# Patient Record
Sex: Male | Born: 1945 | Race: White | Hispanic: No | Marital: Married | Smoking: Current some day smoker
Health system: Southern US, Community
[De-identification: ages and names within clinical notes are randomized; demographics above are authoritative.]

## PROBLEM LIST (undated history)

## (undated) DIAGNOSIS — I5042 Chronic combined systolic (congestive) and diastolic (congestive) heart failure: Secondary | ICD-10-CM

## (undated) DIAGNOSIS — I10 Essential (primary) hypertension: Secondary | ICD-10-CM

## (undated) DIAGNOSIS — I255 Ischemic cardiomyopathy: Secondary | ICD-10-CM

## (undated) DIAGNOSIS — E7800 Pure hypercholesterolemia, unspecified: Secondary | ICD-10-CM

## (undated) DIAGNOSIS — F17200 Nicotine dependence, unspecified, uncomplicated: Secondary | ICD-10-CM

## (undated) DIAGNOSIS — I2510 Atherosclerotic heart disease of native coronary artery without angina pectoris: Secondary | ICD-10-CM

## (undated) DIAGNOSIS — N183 Chronic kidney disease, stage 3 (moderate): Secondary | ICD-10-CM

## (undated) HISTORY — PX: CORONARY STENT PLACEMENT: SHX1402

## (undated) HISTORY — PX: CORONARY ARTERY BYPASS GRAFT: SHX141

## (undated) HISTORY — PX: PACEMAKER INSERTION: SHX728

## (undated) HISTORY — PX: CARDIAC DEFIBRILLATOR PLACEMENT: SHX171

---

## 2007-11-10 ENCOUNTER — Emergency Department (HOSPITAL_COMMUNITY): Admission: EM | Admit: 2007-11-10 | Discharge: 2007-11-10 | Payer: Self-pay | Admitting: Emergency Medicine

## 2008-10-16 ENCOUNTER — Encounter: Payer: Self-pay | Admitting: Internal Medicine

## 2008-10-31 ENCOUNTER — Encounter: Payer: Self-pay | Admitting: Internal Medicine

## 2008-10-31 ENCOUNTER — Ambulatory Visit: Payer: Self-pay | Admitting: Internal Medicine

## 2008-10-31 ENCOUNTER — Ambulatory Visit (HOSPITAL_COMMUNITY): Admission: RE | Admit: 2008-10-31 | Discharge: 2008-10-31 | Payer: Self-pay | Admitting: Internal Medicine

## 2008-11-14 ENCOUNTER — Encounter: Payer: Self-pay | Admitting: Internal Medicine

## 2010-08-31 NOTE — Op Note (Signed)
NAME:  Stephen Macdonald, Stephen Macdonald              ACCOUNT NO.:  [id number]   MEDICAL RECORD NO.:  [id number]          PATIENT TYPE:  AMB   LOCATION:  DAY                           FACILITY:  [facility]   PHYSICIAN:  R. Roetta Sessions, M.D. DATE OF BIRTH:  February 14, 1946   DATE OF PROCEDURE:  10/31/2008  DATE OF DISCHARGE:                               OPERATIVE REPORT   INDICATIONS FOR PROCEDURE:  A 65 year old gentleman sent over at the  request of Dr. Janna Arch for colorectal cancer screening.  He has never  had his lower GI tract evaluated.  He has no lower GI tract symptoms.  No family history of colon polyps or colon cancer.  Colonoscopy is now  being done as a screen maneuver.  The risks, benefits, alternatives and  limitation have been discussed and questions answered, and he is  agreeable.  Please see the documentation in the medical record.   PROCEDURE NOTE:  O2 saturation, blood pressure, pulse, and respirations  were monitored throughout the entirety of the procedure. Conscious  sedation with Versed 4 mg IV, Demerol 75 mg IV in divided doses.   INSTRUMENTATION:  Pentax video chip system.   FINDINGS:  Digital rectal exam revealed no abnormalities.  The prep was  adequate.  The colonic mucosa was surveyed from the rectosigmoid  junction through the left transverse right colon, the appendiceal  orifice, ileocecal valve and cecum.  These structures were well seen and  photographed for the record.  From this level the scope was slowly  withdrawn.  All previous mucosal surfaces were again seen.  The patient  was noted to have a single diminutive polyp in the cecum, which was cold  biopsied and removed.  The remainder of the colonic mucosa appeared  normal aside from scattered left-sided diverticula.  The scope was  pulled down into the rectum, with thorough examination of the rectal  mucosa, including retroflexed view of the anal verge demonstrated no  abnormalities. The patient tolerated the procedure  well and was reactive  throughout the endoscopy.  Cecal withdrawal time was 7 minutes.   IMPRESSION:  1. Normal rectum.  2. Left-sided diverticula.  3. Cecal polyp, which was cold biopsied and removed.  4. The remainder of the colonic mucosa appeared normal.   RECOMMENDATIONS:  1. Diverticulosis and polyp literature provided to Mr. Napoli.  2. Follow up path.  3. Further recommendations to follow.      Jonathon Bellows, M.D.  Electronically Signed     RMR/MEDQ  D:  10/31/2008  T:  10/31/2008  Job:  [id number]   cc:   Melvyn Novas, MD  Fax: [phone]

## 2011-01-14 LAB — BASIC METABOLIC PANEL
CO2: 26
Chloride: 108
GFR calc non Af Amer: >60
Glucose, Bld: 112 — ABNORMAL HIGH
Potassium: 4.1
Sodium: 136

## 2011-01-14 LAB — POCT CARDIAC MARKERS
CKMB, poc: <1 — ABNORMAL LOW
Myoglobin, poc: 44.6
Troponin i, poc: <0.05

## 2011-01-14 LAB — B-NATRIURETIC PEPTIDE (CONVERTED LAB): Pro B Natriuretic peptide (BNP): <30

## 2011-01-14 LAB — CBC
HCT: 45.3
Hemoglobin: 15.6
RDW: 12.5

## 2011-01-14 LAB — DIFFERENTIAL
Basophils Absolute: 0
Eosinophils Relative: 4
Lymphocytes Relative: 28
Lymphs Abs: 2
Monocytes Absolute: 0.6

## 2015-02-12 ENCOUNTER — Encounter (HOSPITAL_COMMUNITY)
Admission: EM | Disposition: A | Discharge status: Home / Self Care | Payer: Self-pay | Source: Home / Self Care | Attending: Interventional Cardiology

## 2015-02-12 ENCOUNTER — Inpatient Hospital Stay (HOSPITAL_COMMUNITY)
Admission: EM | Admit: 2015-02-12 | Discharge: 2015-02-16 | DRG: 247 | Disposition: A | Discharge status: Home / Self Care | Payer: Medicare Other | Attending: Interventional Cardiology | Admitting: Interventional Cardiology

## 2015-02-12 ENCOUNTER — Encounter (HOSPITAL_COMMUNITY): Payer: Self-pay

## 2015-02-12 DIAGNOSIS — I2510 Atherosclerotic heart disease of native coronary artery without angina pectoris: Secondary | ICD-10-CM | POA: Diagnosis present

## 2015-02-12 DIAGNOSIS — Z95810 Presence of automatic (implantable) cardiac defibrillator: Secondary | ICD-10-CM

## 2015-02-12 DIAGNOSIS — I2111 ST elevation (STEMI) myocardial infarction involving right coronary artery: Secondary | ICD-10-CM | POA: Insufficient documentation

## 2015-02-12 DIAGNOSIS — R21 Rash and other nonspecific skin eruption: Secondary | ICD-10-CM | POA: Diagnosis not present

## 2015-02-12 DIAGNOSIS — N179 Acute kidney failure, unspecified: Secondary | ICD-10-CM | POA: Diagnosis not present

## 2015-02-12 DIAGNOSIS — I959 Hypotension, unspecified: Secondary | ICD-10-CM | POA: Diagnosis present

## 2015-02-12 DIAGNOSIS — I420 Dilated cardiomyopathy: Secondary | ICD-10-CM

## 2015-02-12 DIAGNOSIS — I2119 ST elevation (STEMI) myocardial infarction involving other coronary artery of inferior wall: Secondary | ICD-10-CM | POA: Diagnosis present

## 2015-02-12 DIAGNOSIS — I25810 Atherosclerosis of coronary artery bypass graft(s) without angina pectoris: Secondary | ICD-10-CM | POA: Diagnosis present

## 2015-02-12 DIAGNOSIS — E785 Hyperlipidemia, unspecified: Secondary | ICD-10-CM | POA: Insufficient documentation

## 2015-02-12 DIAGNOSIS — I472 Ventricular tachycardia: Secondary | ICD-10-CM | POA: Diagnosis present

## 2015-02-12 DIAGNOSIS — I255 Ischemic cardiomyopathy: Secondary | ICD-10-CM | POA: Diagnosis not present

## 2015-02-12 DIAGNOSIS — Z951 Presence of aortocoronary bypass graft: Secondary | ICD-10-CM | POA: Insufficient documentation

## 2015-02-12 DIAGNOSIS — I429 Cardiomyopathy, unspecified: Secondary | ICD-10-CM | POA: Diagnosis present

## 2015-02-12 DIAGNOSIS — R079 Chest pain, unspecified: Secondary | ICD-10-CM | POA: Diagnosis present

## 2015-02-12 DIAGNOSIS — I4729 Other ventricular tachycardia: Secondary | ICD-10-CM | POA: Insufficient documentation

## 2015-02-12 HISTORY — DX: Atherosclerotic heart disease of native coronary artery without angina pectoris: I25.10

## 2015-02-12 HISTORY — DX: Nicotine dependence, unspecified, uncomplicated: F17.200

## 2015-02-12 HISTORY — PX: CARDIAC CATHETERIZATION: SHX172

## 2015-02-12 LAB — POCT I-STAT, CHEM 8
BUN: 16 mg/dL (ref 6–20)
CALCIUM ION: 1.16 mmol/L (ref 1.13–1.30)
CHLORIDE: 109 mmol/L (ref 101–111)
Creatinine, Ser: 1.1 mg/dL (ref 0.61–1.24)
GLUCOSE: 118 mg/dL — AB (ref 65–99)
HCT: 40 % (ref 39.0–52.0)
Hemoglobin: 13.6 g/dL (ref 13.0–17.0)
POTASSIUM: 3.4 mmol/L — AB (ref 3.5–5.1)
Sodium: 140 mmol/L (ref 135–145)
TCO2: 18 mmol/L (ref 0–100)

## 2015-02-12 LAB — POCT ACTIVATED CLOTTING TIME: Activated Clotting Time: 522 seconds

## 2015-02-12 SURGERY — LEFT HEART CATH AND CORONARY ANGIOGRAPHY
Anesthesia: LOCAL

## 2015-02-12 MED ORDER — BIVALIRUDIN BOLUS VIA INFUSION - CUPID
INTRAVENOUS | Status: DC | PRN
  Administered 2015-02-12: 79.5 mg via INTRAVENOUS

## 2015-02-12 MED ORDER — ACETAMINOPHEN 325 MG PO TABS: 650.0000 mg | ORAL_TABLET | ORAL | Status: DC | PRN

## 2015-02-12 MED ORDER — TICAGRELOR 90 MG PO TABS
ORAL_TABLET | ORAL | Status: AC
  Filled 2015-02-12: qty 1

## 2015-02-12 MED ORDER — ADENOSINE (DIAGNOSTIC) FOR INTRACORONARY USE
INTRAVENOUS | Status: DC | PRN
  Administered 2015-02-12 (×2): 30 ug via INTRACORONARY

## 2015-02-12 MED ORDER — ASPIRIN 81 MG PO CHEW
81.0000 mg | CHEWABLE_TABLET | Freq: Every day | ORAL | Status: DC
  Administered 2015-02-12 – 2015-02-16 (×5): 81 mg via ORAL
  Filled 2015-02-12 (×5): qty 1

## 2015-02-12 MED ORDER — ONDANSETRON HCL 4 MG/2ML IJ SOLN
4.0000 mg | Freq: Four times a day (QID) | INTRAMUSCULAR | Status: DC | PRN
  Administered 2015-02-13: 4 mg via INTRAVENOUS
  Filled 2015-02-12 (×2): qty 2

## 2015-02-12 MED ORDER — FENTANYL CITRATE (PF) 100 MCG/2ML IJ SOLN
INTRAMUSCULAR | Status: DC | PRN
  Administered 2015-02-12 (×2): 25 ug via INTRAVENOUS

## 2015-02-12 MED ORDER — VERAPAMIL HCL 2.5 MG/ML IV SOLN
INTRAVENOUS | Status: AC
  Filled 2015-02-12: qty 2

## 2015-02-12 MED ORDER — SODIUM CHLORIDE 0.9 % IJ SOLN
3.0000 mL | Freq: Two times a day (BID) | INTRAMUSCULAR | Status: DC
  Administered 2015-02-13 – 2015-02-16 (×7): 3 mL via INTRAVENOUS

## 2015-02-12 MED ORDER — ADENOSINE 6 MG/2ML IV SOLN
INTRAVENOUS | Status: AC
  Filled 2015-02-12: qty 2

## 2015-02-12 MED ORDER — BIVALIRUDIN 250 MG IV SOLR
INTRAVENOUS | Status: AC
  Filled 2015-02-12: qty 250

## 2015-02-12 MED ORDER — SODIUM CHLORIDE 0.9 % IJ SOLN: 3.0000 mL | INTRAMUSCULAR | Status: DC | PRN

## 2015-02-12 MED ORDER — SODIUM CHLORIDE 0.9 % IV SOLN
250.0000 mg | INTRAVENOUS | Status: DC | PRN
  Administered 2015-02-12: 1.75 mg/kg/h via INTRAVENOUS

## 2015-02-12 MED ORDER — METOPROLOL TARTRATE 12.5 MG HALF TABLET
12.5000 mg | ORAL_TABLET | Freq: Two times a day (BID) | ORAL | Status: DC
  Administered 2015-02-12 – 2015-02-14 (×4): 12.5 mg via ORAL
  Filled 2015-02-12 (×6): qty 1

## 2015-02-12 MED ORDER — LIDOCAINE HCL (PF) 1 % IJ SOLN
INTRAMUSCULAR | Status: DC | PRN
  Administered 2015-02-12: 15 mL

## 2015-02-12 MED ORDER — ATORVASTATIN CALCIUM 80 MG PO TABS
80.0000 mg | ORAL_TABLET | Freq: Every day | ORAL | Status: DC
  Administered 2015-02-13 – 2015-02-15 (×3): 80 mg via ORAL
  Filled 2015-02-12 (×3): qty 1

## 2015-02-12 MED ORDER — NITROGLYCERIN IN D5W 200-5 MCG/ML-% IV SOLN
INTRAVENOUS | Status: AC
  Filled 2015-02-12: qty 250

## 2015-02-12 MED ORDER — MIDAZOLAM HCL 2 MG/2ML IJ SOLN
INTRAMUSCULAR | Status: DC | PRN
  Administered 2015-02-12 (×2): 1 mg via INTRAVENOUS

## 2015-02-12 MED ORDER — MIDAZOLAM HCL 2 MG/2ML IJ SOLN
INTRAMUSCULAR | Status: AC
  Filled 2015-02-12: qty 4

## 2015-02-12 MED ORDER — HEPARIN SODIUM (PORCINE) 5000 UNIT/ML IJ SOLN
4000.0000 [IU] | Freq: Once | INTRAMUSCULAR | Status: AC
  Administered 2015-02-12: 4000 [IU] via INTRAVENOUS

## 2015-02-12 MED ORDER — TICAGRELOR 90 MG PO TABS
ORAL_TABLET | ORAL | Status: DC | PRN
  Administered 2015-02-12: 180 mg via ORAL

## 2015-02-12 MED ORDER — FENTANYL CITRATE (PF) 100 MCG/2ML IJ SOLN
INTRAMUSCULAR | Status: AC
  Filled 2015-02-12: qty 4

## 2015-02-12 MED ORDER — IOHEXOL 350 MG/ML SOLN
INTRAVENOUS | Status: DC | PRN
  Administered 2015-02-12: 145 mL via INTRA_ARTERIAL

## 2015-02-12 MED ORDER — TICAGRELOR 90 MG PO TABS
90.0000 mg | ORAL_TABLET | Freq: Two times a day (BID) | ORAL | Status: DC
Start: 2015-02-13 — End: 2015-02-16
  Administered 2015-02-13 – 2015-02-16 (×7): 90 mg via ORAL
  Filled 2015-02-12 (×8): qty 1

## 2015-02-12 MED ORDER — SODIUM CHLORIDE 0.9 % IV SOLN
INTRAVENOUS | Status: AC
  Administered 2015-02-12: 22:00:00 via INTRAVENOUS

## 2015-02-12 MED ORDER — SODIUM CHLORIDE 0.9 % IV SOLN: 250.0000 mL | INTRAVENOUS | Status: DC | PRN

## 2015-02-12 MED ORDER — NITROGLYCERIN 1 MG/10 ML FOR IR/CATH LAB
INTRA_ARTERIAL | Status: DC | PRN
  Administered 2015-02-12: 750 mL

## 2015-02-12 MED ORDER — HEPARIN (PORCINE) IN NACL 2-0.9 UNIT/ML-% IJ SOLN
INTRAMUSCULAR | Status: AC
  Filled 2015-02-12: qty 500

## 2015-02-12 SURGICAL SUPPLY — 21 items
BALLN EUPHORA RX 2.5X15 (BALLOONS) ×3
BALLOON EUPHORA RX 2.5X15 (BALLOONS) ×2 IMPLANT
CATH EXPO 5FR FL4 (CATHETERS) ×3 IMPLANT
CATH EXTRAC PRONTO LP 6F RND (CATHETERS) ×6 IMPLANT
CATH INFINITI 5 FR RCB (CATHETERS) ×3 IMPLANT
CATH INFINITI 5FR MULTPACK ANG (CATHETERS) IMPLANT
DEVICE SPIDERFX EMB PROT 5MM (WIRE) ×3 IMPLANT
GUIDE CATH RUNWAY 6FR FR4 (CATHETERS) ×3 IMPLANT
GUIDE CATH RUNWAY 6FR RCB (CATHETERS) ×3 IMPLANT
KIT ENCORE 26 ADVANTAGE (KITS) ×3 IMPLANT
KIT HEART LEFT (KITS) ×3 IMPLANT
PACK CARDIAC CATHETERIZATION (CUSTOM PROCEDURE TRAY) ×3 IMPLANT
SHEATH PINNACLE 6F 10CM (SHEATH) ×3 IMPLANT
STENT SYNERGY DES 3.5X16 (Permanent Stent) ×3 IMPLANT
STENT SYNERGY DES 3.5X24 (Permanent Stent) ×3 IMPLANT
SYR MEDRAD MARK V 150ML (SYRINGE) ×3 IMPLANT
TRANSDUCER W/STOPCOCK (MISCELLANEOUS) ×3 IMPLANT
TUBING CIL FLEX 10 FLL-RA (TUBING) ×3 IMPLANT
VALVE GUARDIAN II ~~LOC~~ HEMO (MISCELLANEOUS) ×3 IMPLANT
WIRE ASAHI PROWATER 180CM (WIRE) ×3 IMPLANT
WIRE EMERALD 3MM-J .035X150CM (WIRE) IMPLANT

## 2015-02-12 NOTE — H&P (Addendum)
Stephen Macdonald is an 69 y.o. male.   Primary Cardiologist: [facility] PMD: Chief Complaint:  Chest pain HPI:  69 year old man who had bypass surgery in 1997. He has not had a cath since that time. He has had a cardiomyopathy. He has had an AICD placed. His cardiology care simply done at [facility]. His bypass surgery was done at the [place]. He was playing domino's and develop chest discomfort. It improved with some nitroglycerin but then worsened again. EMS was called and initial ECG was normal but then subsequent ECG revealed significant inferior ST elevations. He was brought emergently to [place] for cardiac cath.  Past Medical History  Diagnosis Date  . Coronary artery disease     Past Surgical History  Procedure Laterality Date  . Coronary artery bypass graft      x 3  . Pacemaker insertion    . Cardiac defibrillator placement      No family history on file. Social History:  has no tobacco, alcohol, and drug history on file.  Allergies: No Known Allergies  No prescriptions prior to admission    Results for orders placed or performed during the hospital encounter of 02/12/15 (from the past 48 hour(s))  I-STAT, chem 8     Status: Abnormal   Collection Time: 02/12/15  8:35 PM  Result Value Ref Range   Sodium 140 135 - 145 mmol/L   Potassium 3.4 (L) 3.5 - 5.1 mmol/L   Chloride 109 101 - 111 mmol/L   BUN 16 6 - 20 mg/dL   Creatinine, Ser 6.43 0.61 - 1.24 mg/dL   Glucose, Bld 329 (H) 65 - 99 mg/dL   Calcium, Ion 5.18 8.41 - 1.30 mmol/L   TCO2 18 0 - 100 mmol/L   Hemoglobin 13.6 13.0 - 17.0 g/dL   HCT 66.0 63.0 - 16.0 %  POCT Activated clotting time     Status: None   Collection Time: 02/12/15  8:49 PM  Result Value Ref Range   Activated Clotting Time 522 seconds   No results found.  ROS: Significant for chest pain, no bleeding problems, no elective surgery coming up. No interventions after his bypass surgery in 1997. As above, all other systems negative  OBJECTIVE:    Vitals:   Filed Vitals:   02/12/15 2136 02/12/15 2141 02/12/15 2146 02/12/15 2200  BP: 98/67   87/69  Pulse: 78 0 0 77  Resp: 11 0 0 20  SpO2: 95% 0% 0% 93%   I&O's:   Intake/Output Summary (Last 24 hours) at 02/12/15 2303 Last data filed at 02/12/15 2010  Gross per 24 hour  Intake    500 ml  Output      0 ml  Net    500 ml   TELEMETRY: Reviewed telemetry pt in normal sinus rhythm with ST elevation:     PHYSICAL EXAM General: Well developed, well nourished, in no acute distress Head:   Normal cephalic and atramatic  Lungs:   Clear bilaterally to auscultation. Heart:   HRRR S1 S2  No JVD.  1+ right femoral pulse Abdomen: abdomen soft and non-tender Msk:  Back normal,  Normal strength and tone for age. Extremities:   No edema.   Neuro: Alert and oriented. Psych:  Normal affect, responds appropriately Scrotum swollen LABS: Basic Metabolic Panel:  Recent Labs  10/93/23 2035  NA 140  K 3.4*  CL 109  GLUCOSE 118*  BUN 16  CREATININE 1.10   Liver Function Tests: No results  for input(s): AST, ALT, ALKPHOS, BILITOT, PROT, ALBUMIN in the last 72 hours. No results for input(s): LIPASE, AMYLASE in the last 72 hours. CBC:  Recent Labs  02/12/15 2035  HGB 13.6  HCT 40.0   Cardiac Enzymes: No results for input(s): CKTOTAL, CKMB, CKMBINDEX, TROPONINI in the last 72 hours. BNP: Invalid input(s): POCBNP D-Dimer: No results for input(s): DDIMER in the last 72 hours. Hemoglobin A1C: No results for input(s): HGBA1C in the last 72 hours. Fasting Lipid Panel: No results for input(s): CHOL, HDL, LDLCALC, TRIG, CHOLHDL, LDLDIRECT in the last 72 hours. Thyroid Function Tests: No results for input(s): TSH, T4TOTAL, T3FREE, THYROIDAB in the last 72 hours.  Invalid input(s): FREET3 Anemia Panel: No results for input(s): VITAMINB12, FOLATE, FERRITIN, TIBC, IRON, RETICCTPCT in the last 72 hours. Coag Panel:   No results found for: INR,  PROTIME     Assessment/Plan Acute inferior ST elevation MI based on ECG:  Plan emergent trip to the cardiac cath lab. He will likely need dual antiplatelet therapy for at least a year. He'll need aggressive secondary prevention.  Cardiomyopathy: He is on several heart failure meds at home. We do not have the dosages at this point. Given his low blood pressure, will have to hold for now. As his blood pressure increases, would add back the heart failure medicines.  He'll need high-dose statin and beta blocker when hemodynamically stable.  VARANASI,JAYADEEP S. 02/12/2015, 11:03 PM

## 2015-02-12 NOTE — ED Notes (Addendum)
Going to cath lab with this RN and tech

## 2015-02-12 NOTE — ED Provider Notes (Signed)
CSN: [id number]     Arrival date & time 02/12/15  2007 History   None    Chief Complaint  Patient presents with  . Code STEMI      HPI  Patient presents for evaluation of chest pain. States his symptoms started a few hours before his arrival here. Describes a pressure in his chest rating to his left arm. Exactly reminiscent of prior episodes of "heart attacks". Significant history of AICD placement pacemaker previous coronary bypass grafting and multiple PTCAs secondary to coronary artery disease.  Past Medical History  Diagnosis Date  . Coronary artery disease    Past Surgical History  Procedure Laterality Date  . Coronary artery bypass graft      x 3  . Pacemaker insertion    . Cardiac defibrillator placement     No family history on file. Social History  Substance Use Topics  . Smoking status: Unknown If Ever Smoked  . Smokeless tobacco: None  . Alcohol Use: None    Review of Systems  Constitutional: Negative for fever, chills, diaphoresis, appetite change and fatigue.  HENT: Negative for mouth sores, sore throat and trouble swallowing.   Eyes: Negative for visual disturbance.  Respiratory: Positive for chest tightness and shortness of breath. Negative for cough and wheezing.   Cardiovascular: Positive for chest pain.  Gastrointestinal: Negative for nausea, vomiting, abdominal pain, diarrhea and abdominal distention.  Endocrine: Negative for polydipsia, polyphagia and polyuria.  Genitourinary: Negative for dysuria, frequency and hematuria.  Musculoskeletal: Negative for gait problem.  Skin: Negative for color change, pallor and rash.  Neurological: Negative for dizziness, syncope, light-headedness and headaches.  Hematological: Does not bruise/bleed easily.  Psychiatric/Behavioral: Negative for behavioral problems and confusion.      Allergies  Review of patient's allergies indicates no known allergies.  Home Medications   Prior to Admission medications    Not on File   BP 87/69 mmHg  Pulse 77  Resp 20  SpO2 93% Physical Exam  Constitutional: He is oriented to person, place, and time. He appears well-developed and well-nourished. No distress.  HENT:  Head: Normocephalic.  Eyes: Conjunctivae are normal. Pupils are equal, round, and reactive to light. No scleral icterus.  Neck: Normal range of motion. Neck supple. No thyromegaly present.  Cardiovascular: Normal rate and regular rhythm.  Exam reveals no gallop and no friction rub.   No murmur heard. Pulmonary/Chest: Effort normal and breath sounds normal. No respiratory distress. He has no wheezes. He has no rales.  Abdominal: Soft. Bowel sounds are normal. He exhibits no distension. There is no tenderness. There is no rebound.  Musculoskeletal: Normal range of motion.  Neurological: He is alert and oriented to person, place, and time.  Skin: Skin is warm. No rash noted. He is diaphoretic.  Psychiatric: He has a normal mood and affect. His behavior is normal.    ED Course  Procedures (including critical care time) Labs Review Labs Reviewed  POCT I-STAT, CHEM 8 - Abnormal; Notable for the following:    Potassium 3.4 (*)    Glucose, Bld 118 (*)    All other components within normal limits  MRSA PCR SCREENING  BASIC METABOLIC PANEL  CBC  POCT ACTIVATED CLOTTING TIME    Imaging Review No results found. I have personally reviewed and evaluated these images and lab results as part of my medical decision-making.   EKG Interpretation None     EKG here shows acute ST elevations inferiorly. MDM   Final diagnoses:  ST  elevation myocardial infarction involving right coronary artery (HCC)    Initial prehospital EKG shows no ST elevations. Serial repeated EKGs show acute ST elevations consistent with inferior wall MI. Upon arrival patient is bolused with heparin. Continued on O2. Has been given 324 mg by mouth aspirin. Was made "code STEMI" by myself upon seeing his prehospital  EKGs. There was a slight delay in this patient going to the Cath Lab due to multiple acute ST elevation MIs presenting with an hour's time (4).    CRITICAL CARE Performed by: Rolland Porter JOSEPH   Total critical care time: 20 minutes  Critical care time was exclusive of separately billable procedures and treating other patients.  Critical care was necessary to treat or prevent imminent or life-threatening deterioration.  Critical care was time spent personally by me on the following activities: development of treatment plan with patient and/or surrogate as well as nursing, discussions with consultants, evaluation of patient's response to treatment, examination of patient, obtaining history from patient or surrogate, ordering and performing treatments and interventions, ordering and review of laboratory studies, ordering and review of radiographic studies, pulse oximetry and re-evaluation of patient's condition. Care     Rolland Porter, MD 02/12/15 [phone]

## 2015-02-12 NOTE — ED Notes (Signed)
Per [facility], pt was playing dominoes and started having mid cp. Went upstairs and took 2 of his own NTG tablets. Pain did relieve from 10-6/10. Denied any N/V or diaphoresis. EMS arrived and first 12 lead was normal. Less than 2 minutes later he began having elevation in leads 2,3, and avF. Pain went back up and given 2 more NTG and 324 mg ASA. 18g to LH.

## 2015-02-12 NOTE — ED Notes (Signed)
Dr. James at the bedside.  

## 2015-02-13 ENCOUNTER — Encounter (HOSPITAL_COMMUNITY): Payer: Self-pay | Admitting: Interventional Cardiology

## 2015-02-13 DIAGNOSIS — Z951 Presence of aortocoronary bypass graft: Secondary | ICD-10-CM | POA: Insufficient documentation

## 2015-02-13 DIAGNOSIS — E785 Hyperlipidemia, unspecified: Secondary | ICD-10-CM | POA: Insufficient documentation

## 2015-02-13 DIAGNOSIS — I472 Ventricular tachycardia: Secondary | ICD-10-CM

## 2015-02-13 DIAGNOSIS — I2111 ST elevation (STEMI) myocardial infarction involving right coronary artery: Secondary | ICD-10-CM | POA: Insufficient documentation

## 2015-02-13 DIAGNOSIS — I4729 Other ventricular tachycardia: Secondary | ICD-10-CM | POA: Insufficient documentation

## 2015-02-13 LAB — BASIC METABOLIC PANEL
Anion gap: 9 (ref 5–15)
BUN: 15 mg/dL (ref 6–20)
CHLORIDE: 104 mmol/L (ref 101–111)
CO2: 23 mmol/L (ref 22–32)
CREATININE: 1.25 mg/dL — AB (ref 0.61–1.24)
Calcium: 8.4 mg/dL — ABNORMAL LOW (ref 8.9–10.3)
GFR calc Af Amer: >60 mL/min (ref >60)
GFR calc non Af Amer: 57 mL/min — ABNORMAL LOW (ref >60)
GLUCOSE: 135 mg/dL — AB (ref 65–99)
Potassium: 3.6 mmol/L (ref 3.5–5.1)
Sodium: 136 mmol/L (ref 135–145)

## 2015-02-13 LAB — CK TOTAL AND CKMB (NOT AT ARMC)
CK TOTAL: 3663 U/L — AB (ref 49–397)
CK, MB: >260 ng/mL — ABNORMAL HIGH (ref 0.5–5.0)
CK, MB: >260 ng/mL — ABNORMAL HIGH (ref 0.5–5.0)
CK, MB: 229.6 ng/mL — ABNORMAL HIGH (ref 0.5–5.0)
RELATIVE INDEX: 8.2 — AB (ref 0.0–2.5)
Total CK: 3223 U/L — ABNORMAL HIGH (ref 49–397)
Total CK: 2793 U/L — ABNORMAL HIGH (ref 49–397)

## 2015-02-13 LAB — CBC
HCT: 43.9 % (ref 39.0–52.0)
Hemoglobin: 15.6 g/dL (ref 13.0–17.0)
MCH: 32.6 pg (ref 26.0–34.0)
MCHC: 35.5 g/dL (ref 30.0–36.0)
MCV: 91.8 fL (ref 78.0–100.0)
PLATELETS: 178 10*3/uL (ref 150–400)
RBC: 4.78 MIL/uL (ref 4.22–5.81)
RDW: 12.4 % (ref 11.5–15.5)
WBC: 11.3 10*3/uL — ABNORMAL HIGH (ref 4.0–10.5)

## 2015-02-13 LAB — TROPONIN I: Troponin I: 64.86 ng/mL (ref <0.031)

## 2015-02-13 LAB — MRSA PCR SCREENING: MRSA by PCR: NEGATIVE

## 2015-02-13 MED ORDER — FUROSEMIDE 10 MG/ML IJ SOLN
INTRAMUSCULAR | Status: AC
Start: 2015-02-13 — End: 2015-02-13
  Filled 2015-02-13: qty 4

## 2015-02-13 MED ORDER — NITROGLYCERIN 0.4 MG SL SUBL
0.4000 mg | SUBLINGUAL_TABLET | SUBLINGUAL | Status: DC | PRN
  Administered 2015-02-13: 0.4 mg via SUBLINGUAL

## 2015-02-13 MED ORDER — NITROGLYCERIN 0.4 MG SL SUBL
SUBLINGUAL_TABLET | SUBLINGUAL | Status: AC
  Administered 2015-02-13: 0.4 mg via SUBLINGUAL
  Filled 2015-02-13: qty 1

## 2015-02-13 MED ORDER — MIRTAZAPINE 15 MG PO TBDP
15.0000 mg | ORAL_TABLET | Freq: Every day | ORAL | Status: DC
  Administered 2015-02-13 – 2015-02-14 (×2): 15 mg via ORAL
  Filled 2015-02-13 (×6): qty 1

## 2015-02-13 MED ORDER — SODIUM CHLORIDE 0.9 % IV BOLUS (SEPSIS)
250.0000 mL | Freq: Once | INTRAVENOUS | Status: AC
  Administered 2015-02-13: 250 mL via INTRAVENOUS

## 2015-02-13 MED ORDER — DOPAMINE-DEXTROSE 3.2-5 MG/ML-% IV SOLN
INTRAVENOUS | Status: AC
  Filled 2015-02-13: qty 250

## 2015-02-13 MED ORDER — DOPAMINE-DEXTROSE 3.2-5 MG/ML-% IV SOLN
0.0000 ug/kg/min | INTRAVENOUS | Status: DC
  Administered 2015-02-13: 5 ug/kg/min via INTRAVENOUS

## 2015-02-13 MED ORDER — MORPHINE SULFATE (PF) 2 MG/ML IV SOLN
2.0000 mg | INTRAVENOUS | Status: DC | PRN
  Administered 2015-02-13: 2 mg via INTRAVENOUS
  Filled 2015-02-13: qty 1

## 2015-02-13 MED ORDER — FUROSEMIDE 10 MG/ML IJ SOLN
40.0000 mg | Freq: Once | INTRAMUSCULAR | Status: AC
  Administered 2015-02-13: 40 mg via INTRAVENOUS

## 2015-02-13 NOTE — Plan of Care (Signed)
Called by RN for chest pain and no prn meds.  Ordered 2 mg IV morphine as ECG being performed.  On arrival, patient reports difficulty breathing with some mild chest discomfort (1-2/10).  O2 sats in 90s.  ECG revealed SR with PVCs and inferior Q waves with mild ST elevation (signficantly improved from presentation).  Of note, his ECG upon arrival to unit appears to have AIVR most notable in lateral leads (I, aVL, V4-V6) which is no longer present.  Sat pt up to about 60 degrees with improvement in breathing.  No JVP noted, lungs clear.  1 SL NTG given followed by hypotension with SBP 60s.  Fluids restarted and dopamine initiated at 5 and increased to 10 mcg.  Patients chest discomfort resolved but developed nausea in setting of hypotension and dopamine.  SBP improved to 130s and dopamine being down-titrated.   Patient reports his SOB is now much better after 1 episode of emesis and has 1/10 discomfort in neck.

## 2015-02-13 NOTE — Progress Notes (Signed)
Pt complaining of chest pain 3/10 with associated SOB. EKG changes noted. Dr. Onalee Hua notified, received order for 2mg  morphine q4h PRN and HOB elevated. Dr. Onalee Hua in to see the patient. At this time, patient complaining of worsening SOB and pain still 3/10 radiating to neck and yawning. Given SL nitro x1 and IV lasix. BP dropped to 75/58. Rechecked and was 61/46. IV dopamine started. Pt in process of receiving 250cc bolus and Dr. Onalee Hua is at the bedside. Will continue to monitor closely.

## 2015-02-13 NOTE — Progress Notes (Addendum)
CARDIAC REHAB PHASE I   PRE:  Rate/Rhythm: 76 SR c/ PVCs  BP:  Lying: 92/59        SaO2: 93 RA  MODE:  Ambulation: 700 ft   POST:  Rate/Rhythm: 88 paced  BP:  Sitting: 112/65         SaO2: 94 RA  Pt ambulated 700 ft on RA, handheld assist, steady gait, tolerated well.  Pt c/o of mild DOE, denies cp, dizziness, declined rest stop. Began MI/stent education.  Reviewed risk factors, tobacco cessation (pt states he smokes cigars), anti-platelet therapy, stent card, activity restrictions, ntg, exercise, CHF booklet and zone tool, daily weights, sodium and fluid restrictions, and phase 2 cardiac rehab. Gave pt low sodium and heart healthy diet handouts to review. Pt verbalized understanding. Pt states he follows cardiologist at [facility], [place] (Scott Ward-not in [place]), agrees to phase 2 cardiac rehab referral, will send to [place] per pt request.  Pt to recliner after walk, call bell within reach. Will follow-up tomorrow.  [phone]   [name], RN, BSN 02/13/2015 3:58 PM

## 2015-02-13 NOTE — Progress Notes (Signed)
   02/12/15 2200  Clinical Encounter Type  Visited With Health care provider  Visit Type Trauma  Referral From Nurse  Consult/Referral To Chaplain  Advance Directives (For Healthcare)  Does patient have an advance directive? No  Chaplain responded to trauma; Chaplain saw that Pt came through the ED; Chaplain did not get a chance to speak with Pt but with healthcare providers

## 2015-02-13 NOTE — Plan of Care (Signed)
Came by to check in on Stephen Macdonald.  Of note, his access site was without pain, swelling or induration but forgot to document on last note.  He was feeling better overall; dopamine was down to 5 with SBP 140s and MAP 90s, therefore, asked to decrease to 2.5 mcg.  After about 15 minutes, SBP stable ~ 100 mmHg and MAP in low 70s.  Asked dopamine be turned off and to re-start and page if MAP < 65 or symptomatic.

## 2015-02-13 NOTE — Care Management Note (Addendum)
Case Management Note  Patient Details  Name: Stephen Macdonald MRN: [id number] Date of Birth: 01-Jan-1946  Subjective/Objective:      Adm w mi              Action/Plan: lives w fam, pcp [place]  Expected Discharge Date:                  Expected Discharge Plan:     In-House Referral:     Discharge planning Services     Post Acute Care Choice:    Choice offered to:     DME Arranged:    DME Agency:     HH Arranged:    HH Agency:     Status of Service:     Medicare Important Message Given:    Date Medicare IM Given:    Medicare IM give by:    Date Additional Medicare IM Given:    Additional Medicare Important Message give by:     If discussed at Long Length of Stay Meetings, dates discussed:    Additional Comments: ur review done. Gave pt 30day free brilinta card.  Hanley Hays, RN 02/13/2015, 8:21 AM

## 2015-02-13 NOTE — Progress Notes (Signed)
Pt stated that he came in with upper and lower dentures which are now missing. Called ED, security and the cath lab  to check if they were turned in and they each report no dentures have been found. Cath lab called back to report that the pt told his nurse that he put them in a green emesis bag and ripped the top off of the bag in an attempt to indicate there was something inside of the bag but did not make anyone aware that the bag contained his dentures.   Pt states that inside both the uppper and lower set is written '[id number]'  Stanford Breed, RN 02/13/2015 11:39 AM

## 2015-02-13 NOTE — Progress Notes (Signed)
Patient Name: Stephen Macdonald Date of Encounter: 02/13/2015  Active Problems:   Acute inferior myocardial infarction [facility])   Length of Stay: 1  SUBJECTIVE  The patient feels weak and has chronic back pain. Mild CP, no SOB.   CURRENT MEDS . aspirin  81 mg Oral Daily  . atorvastatin  80 mg Oral q1800  . metoprolol tartrate  12.5 mg Oral BID  . sodium chloride  3 mL Intravenous Q12H  . ticagrelor  90 mg Oral BID    OBJECTIVE  Filed Vitals:   02/13/15 0900 02/13/15 0953 02/13/15 1000 02/13/15 1100  BP: 97/61 97/61 103/68 105/69  Pulse:  77    Temp:      TempSrc:      Resp:      Height:      Weight:      SpO2:        Intake/Output Summary (Last 24 hours) at 02/13/15 1113 Last data filed at 02/13/15 0900  Gross per 24 hour  Intake 966.39 ml  Output   1050 ml  Net -83.61 ml   Filed Weights   02/13/15 0130 02/13/15 0500 02/13/15 0800  Weight: 238 lb 8.6 oz (108.2 kg) 223 lb 1.7 oz (101.2 kg) 238 lb 8.6 oz (108.2 kg)    PHYSICAL EXAM  General: Pleasant, NAD. Neuro: Alert and oriented X 3. Moves all extremities spontaneously. Psych: Normal affect. HEENT:  Normal  Neck: Supple without bruits or JVD. Lungs:  Resp regular and unlabored, CTA. Heart: RRR no s3, s4, or murmurs. Abdomen: Soft, non-tender, non-distended, BS + x 4.  Extremities: No clubbing, cyanosis or edema. DP/PT/Radials 2+ and equal bilaterally.  Accessory Clinical Findings  CBC  Recent Labs  02/12/15 2035 02/13/15 0359  WBC  --  11.3*  HGB 13.6 15.6  HCT 40.0 43.9  MCV  --  91.8  PLT  --  178   Basic Metabolic Panel  Recent Labs  02/12/15 2035 02/13/15 0359  NA 140 136  K 3.4* 3.6  CL 109 104  CO2  --  23  GLUCOSE 118* 135*  BUN 16 15  CREATININE 1.10 1.25*  CALCIUM  --  8.4*    Recent Labs  02/13/15 0023 02/13/15 0359  CKTOTAL 3223* 3663*  CKMB >260.0* >260.0*  TROPONINI 64.86*  --    TELE: SR, frequent nsVTs up to 10 beats.  ECG: SR, improved STE in the  inferior leads.    ASSESSMENT AND PLAN  69 year old male who presented with an acute inferior ST elevation MI based on ECG. Max troponin 65, we will continue following. Cath yesterday showed:  There is severe left ventricular systolic dysfunction.  Severe native three vessel CAD.  Mid SVG to RCA Graft lesion, 100% stenosed which is the culprit vessel. Post intervention with overlapping 3.5 mm Synergy stents with distal embolic protection, there is a 0% residual stenosis.  Patent LIMA to LAD, LIMA to OM Y-graft.  Mid Graft lesion, 100% stenosed. Post intervention, there is a 0% residual stenosis.  Continue aggressive medical therapy. There is residual severe disease in the diagonal which is not bypassed. It appears there is a Y graft from the LIMA that supply the circumflex and the LAD.  He will be watched in the ICU. He will be in the hospital for at least two days. Medical therapy for his LV dysfunction.  Continue ASA, ticagrelor, atorvastatin and low dose metoprolol, unable to uptitrate or add ACEI/ARB as he is hypotensive.  Echo for LVEF evaluation is pending.  He is running frequent nsVTs, most probably sec to reperfusion, started on metoprolol, we will follow.   Signed, Lars Masson MD, [facility] 02/13/2015

## 2015-02-14 DIAGNOSIS — I255 Ischemic cardiomyopathy: Secondary | ICD-10-CM

## 2015-02-14 LAB — BASIC METABOLIC PANEL
ANION GAP: 7 (ref 5–15)
BUN: 14 mg/dL (ref 6–20)
CALCIUM: 8.5 mg/dL — AB (ref 8.9–10.3)
CO2: 27 mmol/L (ref 22–32)
Chloride: 101 mmol/L (ref 101–111)
Creatinine, Ser: 1.47 mg/dL — ABNORMAL HIGH (ref 0.61–1.24)
GFR calc non Af Amer: 47 mL/min — ABNORMAL LOW (ref >60)
GFR, EST AFRICAN AMERICAN: 54 mL/min — AB (ref >60)
Glucose, Bld: 120 mg/dL — ABNORMAL HIGH (ref 65–99)
POTASSIUM: 3.7 mmol/L (ref 3.5–5.1)
Sodium: 135 mmol/L (ref 135–145)

## 2015-02-14 MED ORDER — MICONAZOLE NITRATE 2 % EX CREA
TOPICAL_CREAM | Freq: Two times a day (BID) | CUTANEOUS | Status: DC
  Administered 2015-02-14 – 2015-02-16 (×5): via TOPICAL
  Filled 2015-02-14: qty 14

## 2015-02-14 NOTE — Progress Notes (Addendum)
SUBJECTIVE:  No chest pain.  Unable to sleep well in the hospital.  BP had been low even before arrival to the hospital.   OBJECTIVE:   Vitals:   Filed Vitals:   02/14/15 0800 02/14/15 0900 02/14/15 1100 02/14/15 1119  BP: 87/48 99/66 113/74 113/74  Pulse:    66  Temp:    98.5 F (36.9 C)  TempSrc:    Oral  Resp:    17  Height:      Weight:      SpO2:    96%   I&O's:   Intake/Output Summary (Last 24 hours) at 02/14/15 1127 Last data filed at 02/14/15 1100  Gross per 24 hour  Intake   1326 ml  Output    600 ml  Net    726 ml   TELEMETRY: Reviewed telemetry pt in NSR:     PHYSICAL EXAM General: Well developed, well nourished, in no acute distress Head:   Normal cephalic and atramatic  Lungs:   Clear bilaterally to auscultation. Heart: HRRR S1 S2  No JVD.   Abdomen: abdomen soft and non-tender Msk:  Back normal,  Normal strength and tone for age. Extremities: No edema.  No hematoma in the right groin; while examining the groin, noticed an erythematous,  possible mild fungal infection on the skin of the penis Neuro: Alert and oriented. Psych:  Normal affect, responds appropriately Skin: as above   LABS: Basic Metabolic Panel:  Recent Labs  16/01/09 0359 02/14/15 0254  NA 136 135  K 3.6 3.7  CL 104 101  CO2 23 27  GLUCOSE 135* 120*  BUN 15 14  CREATININE 1.25* 1.47*  CALCIUM 8.4* 8.5*   Liver Function Tests: No results for input(s): AST, ALT, ALKPHOS, BILITOT, PROT, ALBUMIN in the last 72 hours. No results for input(s): LIPASE, AMYLASE in the last 72 hours. CBC:  Recent Labs  02/12/15 2035 02/13/15 0359  WBC  --  11.3*  HGB 13.6 15.6  HCT 40.0 43.9  MCV  --  91.8  PLT  --  178   Cardiac Enzymes:  Recent Labs  02/13/15 0023 02/13/15 0359 02/13/15 1118  CKTOTAL 3223* 3663* 2793*  CKMB >260.0* >260.0* 229.6*  TROPONINI 64.86*  --   --    BNP: Invalid input(s): POCBNP D-Dimer: No results for input(s): DDIMER in the last 72  hours. Hemoglobin A1C: No results for input(s): HGBA1C in the last 72 hours. Fasting Lipid Panel: No results for input(s): CHOL, HDL, LDLCALC, TRIG, CHOLHDL, LDLDIRECT in the last 72 hours. Thyroid Function Tests: No results for input(s): TSH, T4TOTAL, T3FREE, THYROIDAB in the last 72 hours.  Invalid input(s): FREET3 Anemia Panel: No results for input(s): VITAMINB12, FOLATE, FERRITIN, TIBC, IRON, RETICCTPCT in the last 72 hours. Coag Panel:   No results found for: INR, PROTIME  RADIOLOGY: No results found.    ASSESSMENT: s/p inferior MI, cardiomyopathy, ARF  PLAN:  No angina.  BP borderline.  Not adding back CHF meds at this point.  EF known to be 30% prior to MI.  ARF likely related to hypotension requiring pressors, contrast and low EF combination.  Repeat BMet in AM.    Would likely be close to discharge once he is ambulating well and his renal function stabilizes.  He has cardiologist at the [facility] who usually follows him.   Spoke to pharmacy: will use miconazole creme for rash on penis, apply BID for 2 weeks  Corky Crafts, MD  02/14/2015  11:27  AM    

## 2015-02-14 NOTE — Progress Notes (Signed)
In to see pt.  Pt declined ambulation in hallway with cardiac rehab staff.  Pt complained of not resting well through the night and feeling exhausted.  Pt given nutrition handout on yesterday. Reviewed with pt heart healthy diet with low sodium restrictions.  Pt verbalizes a good understanding for eating and preparing his food.  Pt shares this with his wife.  Questions answered, denies any other needs.  Pt assured rehab staff that he would ambulate later this morning. Alanson Aly, BSN [phone]

## 2015-02-15 ENCOUNTER — Encounter (HOSPITAL_COMMUNITY): Payer: Self-pay | Admitting: *Deleted

## 2015-02-15 ENCOUNTER — Other Ambulatory Visit (HOSPITAL_COMMUNITY): Payer: Medicare Other

## 2015-02-15 LAB — BASIC METABOLIC PANEL
Anion gap: 7 (ref 5–15)
BUN: 15 mg/dL (ref 6–20)
CO2: 28 mmol/L (ref 22–32)
Calcium: 8.7 mg/dL — ABNORMAL LOW (ref 8.9–10.3)
Chloride: 100 mmol/L — ABNORMAL LOW (ref 101–111)
Creatinine, Ser: 1.39 mg/dL — ABNORMAL HIGH (ref 0.61–1.24)
GFR calc Af Amer: 58 mL/min — ABNORMAL LOW (ref >60)
GFR, EST NON AFRICAN AMERICAN: 50 mL/min — AB (ref >60)
GLUCOSE: 114 mg/dL — AB (ref 65–99)
POTASSIUM: 3.9 mmol/L (ref 3.5–5.1)
Sodium: 135 mmol/L (ref 135–145)

## 2015-02-15 MED ORDER — METOPROLOL SUCCINATE ER 25 MG PO TB24
25.0000 mg | ORAL_TABLET | Freq: Every day | ORAL | Status: DC
  Administered 2015-02-15: 25 mg via ORAL
  Filled 2015-02-15: qty 1

## 2015-02-15 NOTE — Progress Notes (Signed)
Patient ID: Stephen Macdonald, male   DOB: 1945-07-22, 69 y.o.   MRN: [id number]     Subjective:    No complaints  Objective:   Temp:  [97.9 F (36.6 C)-98.5 F (36.9 C)] 98.1 F (36.7 C) (10/30 0313) Pulse Rate:  [66-76] 76 (10/29 2310) Resp:  [16-18] 16 (10/29 2310) BP: (87-113)/(48-74) 110/65 mmHg (10/30 0313) SpO2:  [95 %-97 %] 96 % (10/30 0313)    Filed Weights   02/13/15 0130 02/13/15 0500 02/13/15 0800  Weight: 238 lb 8.6 oz (108.2 kg) 223 lb 1.7 oz (101.2 kg) 238 lb 8.6 oz (108.2 kg)    Intake/Output Summary (Last 24 hours) at 02/15/15 0759 Last data filed at 02/15/15 0300  Gross per 24 hour  Intake   1400 ml  Output   1150 ml  Net    250 ml    Telemetry: NSR  Exam:  General: NAD  Resp: CTAB  Cardiac: RRR, no m/r/g, no jvd  GI: abdomen soft, NT, ND  MSK: no LE edema  Neuro: no focal deficits  Psych: appropriate affect  Lab Results:  Basic Metabolic Panel:  Recent Labs Lab 02/13/15 0359 02/14/15 0254 02/15/15 0310  NA 136 135 135  K 3.6 3.7 3.9  CL 104 101 100*  CO2 23 27 28   GLUCOSE 135* 120* 114*  BUN 15 14 15   CREATININE 1.25* 1.47* 1.39*  CALCIUM 8.4* 8.5* 8.7*    Liver Function Tests: No results for input(s): AST, ALT, ALKPHOS, BILITOT, PROT, ALBUMIN in the last 168 hours.  CBC:  Recent Labs Lab 02/12/15 2035 02/13/15 0359  WBC  --  11.3*  HGB 13.6 15.6  HCT 40.0 43.9  MCV  --  91.8  PLT  --  178    Cardiac Enzymes:  Recent Labs Lab 02/13/15 0023 02/13/15 0359 02/13/15 1118  CKTOTAL 3223* 3663* 2793*  CKMB >260.0* >260.0* 229.6*  TROPONINI 64.86*  --   --     BNP: No results for input(s): PROBNP in the last 8760 hours.  Coagulation: No results for input(s): INR in the last 168 hours.  ECG:   Medications:   Scheduled Medications: . aspirin  81 mg Oral Daily  . atorvastatin  80 mg Oral q1800  . metoprolol tartrate  12.5 mg Oral BID  . miconazole   Topical BID  . mirtazapine  15 mg Oral QHS  .  sodium chloride  3 mL Intravenous Q12H  . ticagrelor  90 mg Oral BID     Infusions: . DOPamine Stopped (02/13/15 0400)     PRN Medications:  sodium chloride, acetaminophen, morphine injection, nitroGLYCERIN, ondansetron (ZOFRAN) IV, sodium chloride     Assessment/Plan    1.STEMI - admit with acute inferior STEMI - Cath 02/12/15 with severe 3 vessel disease, severe LV dysfuction by LV gram. LIMA-LAD patent, LIMA to OM Y graft patent. SVG-RCA 100% lesion, s/p DES x 2.  - echo pending, notes mention prior LVEF around 30% - medical therapy with ASA, atorva 80, lopressor 12.5mg  bid, brilinta. No ACE-I or ARB due to poor renal function and recent hypotension a few days ago - in setting of prior history of LV systolic dysfunction and now stable hemodynamics, change lopressor back to his home Toprol   2.NSVT - on beta blocker  - no significant ectopy on tele over 24 hours  3. AKI - possibly contrast related - mild downtrend in Cr since yesterday - continue to follow. Home ARB remains on hold, potentially restart tomorrow.  4. Penile rash - on miconazole   Transfer to tele floor. F/u echo results. Likely discharge tomorrow.     Dina Rich, M.D.

## 2015-02-16 ENCOUNTER — Inpatient Hospital Stay (HOSPITAL_COMMUNITY): Payer: Medicare Other

## 2015-02-16 DIAGNOSIS — R079 Chest pain, unspecified: Secondary | ICD-10-CM

## 2015-02-16 LAB — CBC
HEMATOCRIT: 43 % (ref 39.0–52.0)
Hemoglobin: 14.6 g/dL (ref 13.0–17.0)
MCH: 31.8 pg (ref 26.0–34.0)
MCHC: 34 g/dL (ref 30.0–36.0)
MCV: 93.7 fL (ref 78.0–100.0)
Platelets: 166 10*3/uL (ref 150–400)
RBC: 4.59 MIL/uL (ref 4.22–5.81)
RDW: 12.3 % (ref 11.5–15.5)
WBC: 8.5 10*3/uL (ref 4.0–10.5)

## 2015-02-16 LAB — BASIC METABOLIC PANEL
Anion gap: 7 (ref 5–15)
BUN: 16 mg/dL (ref 6–20)
CALCIUM: 8.6 mg/dL — AB (ref 8.9–10.3)
CO2: 24 mmol/L (ref 22–32)
Chloride: 106 mmol/L (ref 101–111)
Creatinine, Ser: 1.34 mg/dL — ABNORMAL HIGH (ref 0.61–1.24)
GFR calc Af Amer: >60 mL/min (ref >60)
GFR, EST NON AFRICAN AMERICAN: 52 mL/min — AB (ref >60)
GLUCOSE: 110 mg/dL — AB (ref 65–99)
POTASSIUM: 3.9 mmol/L (ref 3.5–5.1)
Sodium: 137 mmol/L (ref 135–145)

## 2015-02-16 MED ORDER — PERFLUTREN LIPID MICROSPHERE
1.0000 mL | INTRAVENOUS | Status: AC | PRN
  Administered 2015-02-16: 4 mL via INTRAVENOUS
  Filled 2015-02-16: qty 10

## 2015-02-16 MED ORDER — MICONAZOLE NITRATE 2 % EX CREA: TOPICAL_CREAM | Freq: Two times a day (BID) | CUTANEOUS | Status: DC

## 2015-02-16 MED ORDER — TICAGRELOR 90 MG PO TABS: 90.0000 mg | ORAL_TABLET | Freq: Two times a day (BID) | ORAL | Status: DC

## 2015-02-16 MED ORDER — METOPROLOL SUCCINATE ER 25 MG PO TB24
12.5000 mg | ORAL_TABLET | Freq: Every day | ORAL | Status: DC
  Filled 2015-02-16: qty 1

## 2015-02-16 MED ORDER — NITROGLYCERIN 0.4 MG SL SUBL: 0.4000 mg | SUBLINGUAL_TABLET | SUBLINGUAL | Status: AC | PRN

## 2015-02-16 MED ORDER — ATORVASTATIN CALCIUM 80 MG PO TABS: 80.0000 mg | ORAL_TABLET | Freq: Every day | ORAL | Status: AC

## 2015-02-16 NOTE — Care Management Important Message (Signed)
Important Message  Patient Details  Name: MATISYAHU FEWKES MRN: [id number] Date of Birth: 1946-03-21   Medicare Important Message Given:  Yes-second notification given    Kyla Balzarine 02/16/2015, 5:28 PM

## 2015-02-16 NOTE — Progress Notes (Signed)
Paged by nurse regarding hypotension on repeat BP check. Ok to hold toprol XL. May want to observe today and discharge tomorrow once BP stable.   Ramond Dial PA Pager: [phone]

## 2015-02-16 NOTE — Discharge Summary (Signed)
Discharge Summary   Patient ID: Stephen Macdonald,  MRN: [id number], DOB/AGE: 07-22-1945 69 y.o.  Admit date: 02/12/2015 Discharge date: 02/16/2015  Primary Care Provider: No primary care provider on file. Primary Cardiologist: [facility]  Discharge Diagnoses Principal Problem:   Acute inferior myocardial infarction [facility]) Active Problems:   ST elevation myocardial infarction involving right coronary artery (HCC)   S/P CABG x 2   Hyperlipidemia   NSVT (nonsustained ventricular tachycardia) (HCC)   Allergies No Known Allergies  Procedures  Cardiac catheterization 02/12/2015 Conclusion     There is severe left ventricular systolic dysfunction.  Severe native three vessel CAD.  Mid SVG to RCA Graft lesion, 100% stenosed which is the culprit vessel. Post intervention with overlapping 3.5 mm Synergy stents with distal embolic protection, there is a 0% residual stenosis.  Patent LIMA to LAD, LIMA to OM Y-graft.  Mid Graft lesion, 100% stenosed. Post intervention, there is a 0% residual stenosis.  Continue aggressive medical therapy. There is residual severe disease in the diagonal which is not bypassed. It appears there is a Y graft from the LIMA that supply the circumflex and the LAD.  He will be watched in the ICU. He will be in the hospital for at least two days. Medical therapy for his LV dysfunction. I stressed the importance of DAPT for a year.  Add back CHF meds as BP allows.     Echocardiogram 02/16/2015 LV EF: 25% -  30%  ------------------------------------------------------------------- Indications:   Chest pain 786.51.  ------------------------------------------------------------------- History:  PMH: Acute inferior myocardial infarction. ST elevation MI involving RCA. S/P CABG. Risk factors: Dyslipidemia.  ------------------------------------------------------------------- Study Conclusions  - Left ventricle: The cavity size was severely  dilated. Wall thickness was increased in a pattern of moderate LVH. Systolic function was severely reduced. The estimated ejection fraction was in the range of 25% to 30%. Akinesis of the inferior and apical myocardium. - Mitral valve: There was mild regurgitation. - Left atrium: The atrium was moderately dilated. - Right atrium: The atrium was mildly dilated.    Hospital Course  The patient is a 69 year old male with past medical history of CAD s/p CABG 1997 and ICM s/p AICD. His cardiologist is at [facility]. He presented to [facility] on 02/12/2015 after presented with chest discomfort or playing [employer]. Initial EKG showed significant inferior ST elevation. He was taken to cath lab emergently. Cardec catheterization performed on 02/12/2015 showed EF 25-35%, severe native 3v CAD, 100% acute occlusion of mid SVG to RCA treated with 3.5 mm Synergy stents with distal embolic protection, patent LIMA to LAD, LIMA to OM Y graft. 80% ost D2 lesion treated medically. Post cath, he was placed on aspirin and Brilinta along with home Lipitor. He did have hypotension post cath requiring transient dopamine and this was discontinued on 02/13/2015.  He was seen in the morning of 02/16/2015, at which time he was doing well without significant chest discomfort or shortness breath. His blood pressure continued to be low into the 80-90s. We will continue to hold metoprolol and ARB. Given his LV dysfunction, he will need to restart metoprolol and ARB at a later time when his blood pressure improve. Echocardiogram was obtained prior to discharge which showed EF 25-30%, akinesis of the inferior and apical myocardium, mild MR. He ambulated without difficulty, he is cleared for discharge from cardiology perspective to follow-up with his primary cardiologist within a week. I have also emphasized on the importance of compliance with dual antiplatelets therapy  with a new stent. He has been seen by  cardiac rehabilitation and okay for discharge.   Note during this admission, he was given miconazole cream for penile rash. He should followup with his PCP if symptom does not improve.   Discharge Vitals Blood pressure 112/81, pulse 66, temperature 98.3 F (36.8 C), temperature source Oral, resp. rate 16, height 6\' 1"  (1.854 m), weight 235 lb 8 oz (106.822 kg), SpO2 93 %.  Filed Weights   02/13/15 0800 02/15/15 1136 02/16/15 0556  Weight: 238 lb 8.6 oz (108.2 kg) 235 lb 3.7 oz (106.7 kg) 235 lb 8 oz (106.822 kg)    Labs  CBC  Recent Labs  02/16/15 0332  WBC 8.5  HGB 14.6  HCT 43.0  MCV 93.7  PLT 166   Basic Metabolic Panel  Recent Labs  02/15/15 0310 02/16/15 0332  NA 135 137  K 3.9 3.9  CL 100* 106  CO2 28 24  GLUCOSE 114* 110*  BUN 15 16  CREATININE 1.39* 1.34*  CALCIUM 8.7* 8.6*    Disposition  Pt is being discharged home today in good condition.  Follow-up Plans & Appointments      Follow-up Information    Schedule an appointment as soon as possible for a visit with [facility].   Specialty:  General Practice   Why:  Please followup with your primary cardiologist within a week after discharge. Need to discuss with your primary cardiologist when to restart blood pressure medication   Contact information:   [address]  [facility] [address] [phone]       Please follow up.   Why:  Please followup with your primary care provider regarding penile rash especially if it does not improve.      Discharge Medications    Medication List    STOP taking these medications        metoprolol succinate 50 MG 24 hr tablet  Commonly known as:  TOPROL-XL     niacin 1000 MG CR tablet  Commonly known as:  NIASPAN     valsartan 80 MG tablet  Commonly known as:  DIOVAN      TAKE these medications        aspirin EC 81 MG tablet  Take 81 mg by mouth daily.     atorvastatin 80 MG tablet  Commonly known as:  LIPITOR  Take 1 tablet  (80 mg total) by mouth daily.     cholecalciferol 1000 UNITS tablet  Commonly known as:  VITAMIN D  Take 1,000 Units by mouth daily.     miconazole 2 % cream  Commonly known as:  MICOTIN  Apply topically 2 (two) times daily.     nitroGLYCERIN 0.4 MG SL tablet  Commonly known as:  NITROSTAT  Place 1 tablet (0.4 mg total) under the tongue every 5 (five) minutes as needed for chest pain.     ticagrelor 90 MG Tabs tablet  Commonly known as:  BRILINTA  Take 1 tablet (90 mg total) by mouth 2 (two) times daily.        Duration of Discharge Encounter   Greater than 30 minutes including physician time.  Ramond Dial PA-C Pager: [phone] 02/16/2015, 2:24 PM

## 2015-02-16 NOTE — Progress Notes (Signed)
  Echocardiogram 2D Echocardiogram has been performed.  Arvil Chaco 02/16/2015, 11:34 AM

## 2015-02-16 NOTE — Discharge Instructions (Signed)
No driving for 24 hours. No lifting over 5 lbs for 1 week. No sexual activity for 1 week. Keep procedure site clean & dry. If you notice increased pain, swelling, bleeding or pus, call/return!  You may shower, but no soaking baths/hot tubs/pools for 1 week.   Note: during this admission, your blood pressure was too low to restart home metoprolol and losartan. You will need to discuss with your primary cardiologist on when to restart them.   Although given new stent to SVG to RCA, please make sure you take aspirin and brilinta as instructed. -

## 2015-02-16 NOTE — Progress Notes (Addendum)
CARDIAC REHAB PHASE I   PRE:  Rate/Rhythm: 76 SR  BP:  Sitting: 113/82        SaO2: 96 RA  MODE:  Ambulation: 500 ft   POST:  Rate/Rhythm: 86 paced  BP:  Sitting: 103/77         SaO2: 96 RA  Pt up in room, states he has walked twice out in the hallway independently. Pt ambulated 500 ft on RA, independent, steady gait, tolerated well. Pt denies CP, dizziness, DOE, declined rest stop. Pt BP dropped slightly after ambulation (SBP drop from 113 to 103), pt states he feels "fine." RN notified. Pt states he only feels dizzy when he get up in the morning and his blood pressure seems to be lower at night than during the day. Pt states he has not questions regarding education at this time, eager to go home. Pt to bed after walk, call bell within reach.   [id number]  [name], RN, BSN 02/16/2015 2:14 PM

## 2015-02-16 NOTE — Progress Notes (Signed)
Orders received for pt discharge.  Discharge summary printed and reviewed with pt.  Explained medication regimen, and pt had no further questions at this time.  IV removed and site remains clean, dry, intact.  Telemetry removed.  Pt in stable condition and awaiting transport. 

## 2015-02-16 NOTE — Progress Notes (Signed)
Patient Name: Stephen Macdonald Date of Encounter: 02/16/2015  Primary Cardiologist: [facility]   Principal Problem:   Acute inferior myocardial infarction [facility]) Active Problems:   ST elevation myocardial infarction involving right coronary artery (HCC)   S/P CABG x 2   Hyperlipidemia   NSVT (nonsustained ventricular tachycardia) (HCC)    SUBJECTIVE  Denies any CP or SOB  CURRENT MEDS . aspirin  81 mg Oral Daily  . atorvastatin  80 mg Oral q1800  . metoprolol succinate  12.5 mg Oral Daily  . miconazole   Topical BID  . mirtazapine  15 mg Oral QHS  . sodium chloride  3 mL Intravenous Q12H  . ticagrelor  90 mg Oral BID    OBJECTIVE  Filed Vitals:   02/15/15 2027 02/16/15 0027 02/16/15 0556 02/16/15 0800  BP: 104/63 95/73 82/45  93/59  Pulse: 76 78 66 66  Temp: 99.1 F (37.3 C) 98.4 F (36.9 C) 97.8 F (36.6 C) 98.5 F (36.9 C)  TempSrc: Oral Oral Oral Oral  Resp: 18 17 18 16   Height:      Weight:   235 lb 8 oz (106.822 kg)   SpO2: 94% 96% 96% 94%    Intake/Output Summary (Last 24 hours) at 02/16/15 0835 Last data filed at 02/16/15 0710  Gross per 24 hour  Intake   1440 ml  Output   1900 ml  Net   -460 ml   Filed Weights   02/13/15 0800 02/15/15 1136 02/16/15 0556  Weight: 238 lb 8.6 oz (108.2 kg) 235 lb 3.7 oz (106.7 kg) 235 lb 8 oz (106.822 kg)    PHYSICAL EXAM  General: Pleasant, NAD. Neuro: Alert and oriented X 3. Moves all extremities spontaneously. Psych: Normal affect. HEENT:  Normal  Neck: Supple without bruits or JVD. Lungs:  Resp regular and unlabored, CTA. Heart: RRR no s3, s4, or murmurs. Abdomen: Soft, non-tender, non-distended, BS + x 4. R groin cath site stable.  Extremities: No clubbing, cyanosis or edema. DP/PT/Radials 2+ and equal bilaterally.  Accessory Clinical Findings  CBC  Recent Labs  02/16/15 0332  WBC 8.5  HGB 14.6  HCT 43.0  MCV 93.7  PLT 166   Basic Metabolic Panel  Recent Labs  02/15/15 0310  02/16/15 0332  NA 135 137  K 3.9 3.9  CL 100* 106  CO2 28 24  GLUCOSE 114* 110*  BUN 15 16  CREATININE 1.39* 1.34*  CALCIUM 8.7* 8.6*   Cardiac Enzymes  Recent Labs  02/13/15 1118  CKTOTAL 2793*  CKMB 229.6*    TELE NSR without significant ventricular ectopy    ECG  No new EKG  Echocardiogram  pending    Radiology/Studies  No results found.  ASSESSMENT AND PLAN  1. Acute inferior STEMI  - cath 02/12/2015 EF 25-35%, severe native 3v CAD, 100% acute occlusion of mid SVG to RCA treated with 3.5 mm Synergy stents with distal embolic protection, patent LIMA to LAD, LIMA to OM Y graft. 80% ost D2 lesion treated medically.   - post cath course complicated by hypotension requiring transient dopamine  - pending echo today  - continue ASA, lipitor, brilinta. Will cut Toprol XL in half given hypotension. Unable to restart home ARB  - possible home later today or tomorrow if SBP came up and echo is stable  2. Hypotension: SBP 80s last night. Cut toprol XL to 12.5mg   3. CAD s/p CABG 1997  4. ICM s/p AICD  5. NSVT  6. AKI  7. Penile rash on miconazole  Signed, Azalee Course PA-C Pager: [phone]

## 2015-06-16 ENCOUNTER — Encounter (HOSPITAL_COMMUNITY)
Admission: RE | Admit: 2015-06-16 | Discharge: 2015-06-16 | Disposition: A | Discharge status: Home / Self Care | Payer: Medicare Other | Source: Ambulatory Visit | Attending: Interventional Cardiology | Admitting: Interventional Cardiology

## 2015-06-16 VITALS — Ht 73.0 in | Wt 241.9 lb

## 2015-06-16 DIAGNOSIS — Z9861 Coronary angioplasty status: Secondary | ICD-10-CM

## 2015-06-16 DIAGNOSIS — I2111 ST elevation (STEMI) myocardial infarction involving right coronary artery: Secondary | ICD-10-CM | POA: Diagnosis present

## 2015-06-16 NOTE — Patient Instructions (Signed)
Patient Instructions  Patient Details  Name: Stephen Macdonald MRN: [id number] Date of Birth: 02/28/1946 Referring Provider:  Corky Crafts, MD  Below are the personal goals you chose as well as exercise and nutrition goals. Our goal is to help you keep on track towards obtaining and maintaining your goals. We will be discussing your progress on these goals with you throughout the program.  Initial Exercise Prescription:     Initial Exercise Prescription - 06/16/15 1500    Date of Initial Exercise Prescription   Date 06/16/15   Treadmill   MPH 2   Grade 0   Minutes 15   METs 2.53   NuStep   Level 2   Watts --   Minutes 15   METs --   Recumbant Elliptical   Level 1   Minutes 15   Prescription Details   Frequency (times per week) 3   Intensity   THRR REST +  30   THRR 40-80% of Max Heartrate 101-134   Ratings of Perceived Exertion 11-13   Progression   Progression Continue to progress workloads to maintain intensity without signs/symptoms of physical distress.   Resistance Training   Training Prescription Yes   Weight 2lbs   Reps 10-12      Exercise Goals: Frequency: Be able to perform aerobic exercise three times per week working toward 3-5 days per week.  Intensity: Work with a perceived exertion of 11 (fairly light) - 15 (hard) as tolerated. Follow your new exercise prescription and watch for changes in prescription as you progress with the program. Changes will be reviewed with you when they are made.  Duration: You should be able to do 30 minutes of continuous aerobic exercise in addition to a 5 minute warm-up and a 5 minute cool-down routine.  Nutrition Goals: Your personal nutrition goals will be established when you do your nutrition analysis with the dietician.  The following are nutrition guidelines to follow: Cholesterol < /day Sodium < /day Fiber: Men over 50 yrs - 30 grams per day  Personal Goals:     Personal Goals and Risk  Factors at Admission - 06/16/15 1529    Core Components/Risk Factors/Patient Goals on Admission    Weight Management Yes;Obesity   Intervention Weight Management: Develop a combined nutrition and exercise program designed to reach desired caloric intake, while maintaining appropriate intake of nutrient and fiber, sodium and fats, and appropriate energy expenditure required for the weight goal.;Weight Management: Provide education and appropriate resources to help participant work on and attain dietary goals.;Weight Management/Obesity: Establish reasonable short term and long term weight goals.;Obesity: Provide education and appropriate resources to help participant work on and attain dietary goals.   Admit Weight 241 lb 14.4 oz (109.725 kg)   Goal Weight: Short Term 231 lb (104.781 kg)   Goal Weight: Long Term 226 lb (102.513 kg)   Expected Outcomes Short Term: Continue to assess and modify interventions until short term weight is achieved.;Long Term: Adherence to nutrition and physical activity/exercise program aimed toward attainment of established weight goal.   Sedentary Yes   Intervention Provide advice, education, support and counseling about physical activity/exercise needs.;Develop an individualized exercise prescription for aerobic and resistive training based on initial evaluation findings, risk stratification, comorbidities and participant's personal goals.   Expected Outcomes Achievement of increased cardiorespiratory fitness and enhanced flexibility, muscular endurance and strength shown through measurements of functional capaciy and personal statement of participant.   Tobacco Cessation Yes   Number of packs per  day cigars, 4-5 a week  patient does not want to quit   Intervention Assist the participant in steps to quit. Provide individualized education and counseling about committing to Tobacco Cessation, relapse prevention, and pharmacological support that can be provided by physician.    Hypertension Yes   Intervention Provide education on lifestyle modifcations including regular physical activity/exercise, weight management, moderate sodium restriction and increased consumption of fresh fruit, vegetables, and low fat dairy, alcohol moderation, and smoking cessation.;Monitor prescription use compliance.   Expected Outcomes Short Term: Continued assessment and intervention until BP is < 140/29mm HG in hypertensive participants. < 130/59mm HG in hypertensive participants with diabetes, heart failure or chronic kidney disease.;Long Term: Maintenance of blood pressure at goal levels.   Lipids Yes   Intervention Provide education and support for participant on nutrition & aerobic/resistive exercise along with prescribed medications to achieve LDL 70mg , HDL >40mg .   Expected Outcomes Short Term: Participant states understanding of desired cholesterol values and is compliant with medications prescribed. Participant is following exercise prescription and nutrition guidelines.;Long Term: Cholesterol controlled with medications as prescribed, with individualized exercise RX and with personalized nutrition plan. Value goals: LDL < 70mg , HDL > 40 mg.      Tobacco Use Initial Evaluation: History  Smoking status  . Former Smoker  Smokeless tobacco  . Not on file    Copy of goals given to participant.

## 2015-06-16 NOTE — Progress Notes (Addendum)
Cardiac Individual Treatment Plan  Patient Details  Name: Stephen Macdonald MRN: [id number] Date of Birth: [birth year] Referring Provider:  Corky Crafts, MD  Initial Encounter Date:       CARDIAC REHAB PHASE II ORIENTATION from 06/16/2015 in [place] CARDIAC REHABILITATION   Date  06/16/15      Visit Diagnosis: ST elevation (STEMI) myocardial infarction involving right coronary artery (HCC)  Post PTCA  Patient's Home Medications on Admission:  Current outpatient prescriptions:  .  aspirin EC 81 MG tablet, Take 81 mg by mouth daily., Disp: , Rfl:  .  atorvastatin (LIPITOR) 80 MG tablet, Take 1 tablet (80 mg total) by mouth daily., Disp: 30 tablet, Rfl: 11 .  cholecalciferol (VITAMIN D) 1000 UNITS tablet, Take 1,000 Units by mouth daily., Disp: , Rfl:  .  miconazole (MICOTIN) 2 % cream, Apply topically 2 (two) times daily., Disp: 28.35 g, Rfl: 0 .  nitroGLYCERIN (NITROSTAT) 0.4 MG SL tablet, Place 1 tablet (0.4 mg total) under the tongue every 5 (five) minutes as needed for chest pain., Disp: 25 tablet, Rfl: 3 .  ticagrelor (BRILINTA) 90 MG TABS tablet, Take 1 tablet (90 mg total) by mouth 2 (two) times daily., Disp: 60 tablet, Rfl: 11  Past Medical History: Past Medical History  Diagnosis Date  . Coronary artery disease   . Smoker     Tobacco Use: History  Smoking status  . Former Smoker  Smokeless tobacco  . Not on file    Labs:     Recent Review Advice worker    Labs for ITP Cardiac and Pulmonary Rehab Latest Ref Rng 02/12/2015   TCO2 0 - 100 mmol/L 18      Capillary Blood Glucose: No results found for: GLUCAP   Exercise Target Goals: Date: 06/16/15  Exercise Program Goal: Individual exercise prescription set with THRR, safety & activity barriers. Participant demonstrates ability to understand and report RPE using BORG scale, to self-measure pulse accurately, and to acknowledge the importance of the exercise prescription.  Exercise Prescription  Goal: Starting with aerobic activity 30 plus minutes a day, 3 days per week for initial exercise prescription. Provide home exercise prescription and guidelines that participant acknowledges understanding prior to discharge.  Activity Barriers & Risk Stratification:     Activity Barriers & Cardiac Risk Stratification - 06/16/15 1518    Activity Barriers & Cardiac Risk Stratification   Activity Barriers None   Cardiac Risk Stratification High      6 Minute Walk:     6 Minute Walk      06/16/15 1457       6 Minute Walk   Phase Initial     Distance 1500 feet     Walk Time 6 minutes     # of Rest Breaks 0     MPH 2.84     METS 3.18     RPE 9     Perceived Dyspnea  9     VO2 Peak 10.49     Symptoms No     Resting HR 68 bpm     Resting BP 118/72 mmHg     Max Ex. HR 95 bpm     Max Ex. BP 132/76 mmHg     Pre/Post BP   Baseline BP 118/72 mmHg     6 Minute BP 132/76 mmHg     2 Minute Post BP 128/74 mmHg     Pre/Post BP? Yes        Initial Exercise Prescription:  Initial Exercise Prescription - 06/16/15 1500    Date of Initial Exercise Prescription   Date 06/16/15   Treadmill   MPH 2   Grade 0   Minutes 15   METs 2.53   NuStep   Level 2   Watts --   Minutes 15   METs --   Recumbant Elliptical   Level 1   Minutes 15   Prescription Details   Frequency (times per week) 3   Intensity   THRR REST +  30   THRR 40-80% of Max Heartrate 101-134   Ratings of Perceived Exertion 11-13   Progression   Progression Continue to progress workloads to maintain intensity without signs/symptoms of physical distress.   Resistance Training   Training Prescription Yes   Weight 2lbs   Reps 10-12      Perform Capillary Blood Glucose checks as needed.  Exercise Prescription Changes:   Discharge Exercise Prescription (Final Exercise Prescription Changes):   Nutrition:  Target Goals: Understanding of nutrition guidelines, daily intake of sodium 1500mg , cholesterol  200mg , calories 30% from fat and 7% or less from saturated fats, daily to have 5 or more servings of fruits and vegetables.  Biometrics:     Pre Biometrics - 06/16/15 1615    Pre Biometrics   Height  (1.854 m)   Weight 241 lb 14.4 oz (109.725 kg)   Waist Circumference 43 inches   Hip Circumference 42 inches   Waist to Hip Ratio 1.02 %   BMI (Calculated) 32   Triceps Skinfold 11 mm   % Body Fat 28.7 %   Grip Strength 97 kg   Flexibility 10 in   Single Leg Stand 25 seconds       Nutrition Therapy Plan and Nutrition Goals:     Nutrition Therapy & Goals - 06/16/15 1528    Intervention Plan   Intervention Prescribe, educate and counsel regarding individualized specific dietary modifications aiming towards targeted core components such as weight, hypertension, lipid management, diabetes, heart failure and other comorbidities.;Nutrition handout(s) given to patient.   Expected Outcomes Short Term Goal: Understand basic principles of dietary content, such as calories, fat, sodium, cholesterol and nutrients.;Long Term Goal: Adherence to prescribed nutrition plan.      Nutrition Discharge: Rate Your Plate Scores:     Nutrition Assessments - 06/16/15 1529    MEDFICTS Scores   Pre Score 65      Nutrition Goals Re-Evaluation:   Psychosocial: Target Goals: Acknowledge presence or absence of depression, maximize coping skills, provide positive support system. Participant is able to verbalize types and ability to use techniques and skills needed for reducing stress and depression.  Initial Review & Psychosocial Screening:     Initial Psych Review & Screening - 06/16/15 1541    Family Dynamics   Good Support System? Yes   Barriers   Psychosocial barriers to participate in program There are no identifiable barriers or psychosocial needs.   Screening Interventions   Interventions Encouraged to exercise      Quality of Life Scores:     Quality of Life - 06/16/15 1527     Quality of Life Scores   Health/Function Pre 18.14 %   Socioeconomic Pre 23.21 %   Psych/Spiritual Pre 20.75 %   Family Pre 23 %   GLOBAL Pre 20.5 %      PHQ-9:     Recent Review Flowsheet Data    Depression screen [facility] 2/9 06/16/2015   Decreased Interest 1  Down, Depressed, Hopeless 1   PHQ - 2 Score 2   Altered sleeping 0   Tired, decreased energy 0   Change in appetite 0   Feeling bad or failure about yourself  0   Trouble concentrating 0   Moving slowly or fidgety/restless 0   Suicidal thoughts 0   PHQ-9 Score 2   Difficult doing work/chores Somewhat difficult      Psychosocial Evaluation and Intervention:     Psychosocial Evaluation - 06/16/15 1559    Psychosocial Evaluation & Interventions   Interventions Encouraged to exercise with the program and follow exercise prescription   Comments Patient retired [employer] with grown children. No s/s of depression.  Patient states life stressors just decreased due to paying off mortgage,  involved in community game like dominoes.    Continued Psychosocial Services Needed No      Psychosocial Re-Evaluation:   Vocational Rehabilitation: Provide vocational rehab assistance to qualifying candidates.   Vocational Rehab Evaluation & Intervention:     Vocational Rehab - 06/16/15 1522    Initial Vocational Rehab Evaluation & Intervention   Assessment shows need for Vocational Rehabilitation No      Education: Education Goals: Education classes will be provided on a weekly basis, covering required topics. Participant will state understanding/return demonstration of topics presented.  Learning Barriers/Preferences:     Learning Barriers/Preferences - 06/16/15 1518    Learning Barriers/Preferences   Learning Barriers None   Learning Preferences Pictoral;Skilled Demonstration      Education Topics: Hypertension, Hypertension Reduction -Define heart disease and high blood pressure. Discus how high blood pressure  affects the body and ways to reduce high blood pressure.   Exercise and Your Heart -Discuss why it is important to exercise, the FITT principles of exercise, normal and abnormal responses to exercise, and how to exercise safely.   Angina -Discuss definition of angina, causes of angina, treatment of angina, and how to decrease risk of having angina.   Cardiac Medications -Review what the following cardiac medications are used for, how they affect the body, and side effects that may occur when taking the medications.  Medications include Aspirin, Beta blockers, calcium channel blockers, ACE Inhibitors, angiotensin receptor blockers, diuretics, digoxin, and antihyperlipidemics.   Congestive Heart Failure -Discuss the definition of CHF, how to live with CHF, the signs and symptoms of CHF, and how keep track of weight and sodium intake.   Heart Disease and Intimacy -Discus the effect sexual activity has on the heart, how changes occur during intimacy as we age, and safety during sexual activity.   Smoking Cessation / COPD -Discuss different methods to quit smoking, the health benefits of quitting smoking, and the definition of COPD.   Nutrition I: Fats -Discuss the types of cholesterol, what cholesterol does to the heart, and how cholesterol levels can be controlled.   Nutrition II: Labels -Discuss the different components of food labels and how to read food label   Heart Parts and Heart Disease -Discuss the anatomy of the heart, the pathway of blood circulation through the heart, and these are affected by heart disease.   Stress I: Signs and Symptoms -Discuss the causes of stress, how stress may lead to anxiety and depression, and ways to limit stress.   Stress II: Relaxation -Discuss different types of relaxation techniques to limit stress.   Warning Signs of Stroke / TIA -Discuss definition of a stroke, what the signs and symptoms are of a stroke, and how to identify  when someone  is having stroke.   Knowledge Questionnaire Score:     Knowledge Questionnaire Score - 06/16/15 1519    Knowledge Questionnaire Score   Pre Score 26/28      Personal Goals and Risk Factors at Admission:     Personal Goals and Risk Factors at Admission - 06/16/15 1529    Core Components/Risk Factors/Patient Goals on Admission    Weight Management Yes;Obesity   Intervention Weight Management: Develop a combined nutrition and exercise program designed to reach desired caloric intake, while maintaining appropriate intake of nutrient and fiber, sodium and fats, and appropriate energy expenditure required for the weight goal.;Weight Management: Provide education and appropriate resources to help participant work on and attain dietary goals.;Weight Management/Obesity: Establish reasonable short term and long term weight goals.;Obesity: Provide education and appropriate resources to help participant work on and attain dietary goals.   Admit Weight 241 lb 14.4 oz (109.725 kg)   Goal Weight: Short Term 231 lb (104.781 kg)   Goal Weight: Long Term 226 lb (102.513 kg)   Expected Outcomes Short Term: Continue to assess and modify interventions until short term weight is achieved.;Long Term: Adherence to nutrition and physical activity/exercise program aimed toward attainment of established weight goal.   Sedentary Yes   Intervention Provide advice, education, support and counseling about physical activity/exercise needs.;Develop an individualized exercise prescription for aerobic and resistive training based on initial evaluation findings, risk stratification, comorbidities and participant's personal goals.   Expected Outcomes Achievement of increased cardiorespiratory fitness and enhanced flexibility, muscular endurance and strength shown through measurements of functional capaciy and personal statement of participant.   Tobacco Cessation Yes   Number of packs per day cigars, 4-5 a week   patient does not want to quit   Intervention Assist the participant in steps to quit. Provide individualized education and counseling about committing to Tobacco Cessation, relapse prevention, and pharmacological support that can be provided by physician.   Hypertension Yes   Intervention Provide education on lifestyle modifcations including regular physical activity/exercise, weight management, moderate sodium restriction and increased consumption of fresh fruit, vegetables, and low fat dairy, alcohol moderation, and smoking cessation.;Monitor prescription use compliance.   Expected Outcomes Short Term: Continued assessment and intervention until BP is < 140/54mm HG in hypertensive participants. < 130/61mm HG in hypertensive participants with diabetes, heart failure or chronic kidney disease.;Long Term: Maintenance of blood pressure at goal levels.   Lipids Yes   Intervention Provide education and support for participant on nutrition & aerobic/resistive exercise along with prescribed medications to achieve LDL 70mg , HDL >40mg .   Expected Outcomes Short Term: Participant states understanding of desired cholesterol values and is compliant with medications prescribed. Participant is following exercise prescription and nutrition guidelines.;Long Term: Cholesterol controlled with medications as prescribed, with individualized exercise RX and with personalized nutrition plan. Value goals: LDL < 70mg , HDL > 40 mg.      Personal Goals and Risk Factors Review:    Personal Goals Discharge (Final Personal Goals and Risk Factors Review):    ITP Comments:     ITP Comments      06/16/15 1524           ITP Comments Orientation ITP completed.  Continue with ITP.           Comments:

## 2015-06-22 ENCOUNTER — Encounter (HOSPITAL_COMMUNITY)
Admission: RE | Admit: 2015-06-22 | Discharge: 2015-06-22 | Disposition: A | Discharge status: Home / Self Care | Payer: Medicare Other | Source: Ambulatory Visit | Attending: Interventional Cardiology | Admitting: Interventional Cardiology

## 2015-06-22 DIAGNOSIS — I2111 ST elevation (STEMI) myocardial infarction involving right coronary artery: Secondary | ICD-10-CM | POA: Diagnosis present

## 2015-06-22 NOTE — Progress Notes (Signed)
Cardiac/Pulmonary Rehab Medication Review by a Pharmacist  Does the patient  feel that his/her medications are working for him/her?  yes  Has the patient been experiencing any side effects to the medications prescribed?  no  Does the patient measure his/her own blood pressure or blood glucose at home?  yes   Does the patient have any problems obtaining medications due to transportation or finances?   no  Understanding of regimen: excellent Understanding of indications: excellent Potential of compliance: excellent  Questions asked to Determine Patient Understanding of Medication Regimen:  1. What is the name of the medication?  2. What is the medication used for?  3. When should it be taken?  4. How much should be taken?  5. How will you take it?  6. What side effects should you report?  Understanding Defined as: Excellent: All questions above are correct Good: Questions 1-4 are correct Fair: Questions 1-2 are correct  Poor: 1 or none of the above questions are correct   Pharmacist comments: Pleasant gentleman that is aware of medications post heart attack. Aware of side effects and monitors blood pressure at home.  Follow also with [facility]. Tolerating medications without any problems.   Thanks for allowing me to participate in the care of this patient,  Elder Cyphers, BS Loura Back, BCPS Clinical Pharmacist Pager [phone]     Tera Mater 06/22/2015 9:18 AM

## 2015-06-24 ENCOUNTER — Encounter (HOSPITAL_COMMUNITY)
Admission: RE | Admit: 2015-06-24 | Discharge: 2015-06-24 | Disposition: A | Discharge status: Home / Self Care | Payer: Medicare Other | Source: Ambulatory Visit | Attending: Interventional Cardiology | Admitting: Interventional Cardiology

## 2015-06-24 DIAGNOSIS — I2111 ST elevation (STEMI) myocardial infarction involving right coronary artery: Secondary | ICD-10-CM | POA: Diagnosis not present

## 2015-06-25 ENCOUNTER — Encounter (HOSPITAL_COMMUNITY): Payer: Medicare Other

## 2015-06-26 ENCOUNTER — Encounter (HOSPITAL_COMMUNITY)
Admission: RE | Admit: 2015-06-26 | Discharge: 2015-06-26 | Disposition: A | Discharge status: Home / Self Care | Payer: Medicare Other | Source: Ambulatory Visit | Attending: Interventional Cardiology | Admitting: Interventional Cardiology

## 2015-06-26 DIAGNOSIS — I2111 ST elevation (STEMI) myocardial infarction involving right coronary artery: Secondary | ICD-10-CM | POA: Diagnosis not present

## 2015-06-29 ENCOUNTER — Encounter (HOSPITAL_COMMUNITY)
Admission: RE | Admit: 2015-06-29 | Discharge: 2015-06-29 | Disposition: A | Discharge status: Home / Self Care | Payer: Medicare Other | Source: Ambulatory Visit | Attending: Interventional Cardiology | Admitting: Interventional Cardiology

## 2015-06-29 DIAGNOSIS — I2111 ST elevation (STEMI) myocardial infarction involving right coronary artery: Secondary | ICD-10-CM | POA: Diagnosis not present

## 2015-07-01 ENCOUNTER — Encounter (HOSPITAL_COMMUNITY)
Admission: RE | Admit: 2015-07-01 | Discharge: 2015-07-01 | Disposition: A | Discharge status: Home / Self Care | Payer: Medicare Other | Source: Ambulatory Visit | Attending: Interventional Cardiology | Admitting: Interventional Cardiology

## 2015-07-01 DIAGNOSIS — I2111 ST elevation (STEMI) myocardial infarction involving right coronary artery: Secondary | ICD-10-CM | POA: Diagnosis not present

## 2015-07-01 NOTE — Progress Notes (Signed)
Patient was given individual home exercise plan. Handout was reviewed and discussed with patient. Patient signed plan and verbalized an understanding.  

## 2015-07-03 ENCOUNTER — Encounter (HOSPITAL_COMMUNITY)
Admission: RE | Admit: 2015-07-03 | Discharge: 2015-07-03 | Disposition: A | Discharge status: Home / Self Care | Payer: Medicare Other | Source: Ambulatory Visit | Attending: Interventional Cardiology | Admitting: Interventional Cardiology

## 2015-07-03 DIAGNOSIS — I2111 ST elevation (STEMI) myocardial infarction involving right coronary artery: Secondary | ICD-10-CM | POA: Diagnosis not present

## 2015-07-06 ENCOUNTER — Encounter (HOSPITAL_COMMUNITY): Payer: Medicare Other

## 2015-07-08 ENCOUNTER — Encounter (HOSPITAL_COMMUNITY)
Admission: RE | Admit: 2015-07-08 | Discharge: 2015-07-08 | Disposition: A | Discharge status: Home / Self Care | Payer: Medicare Other | Source: Ambulatory Visit | Attending: Interventional Cardiology | Admitting: Interventional Cardiology

## 2015-07-08 DIAGNOSIS — I2111 ST elevation (STEMI) myocardial infarction involving right coronary artery: Secondary | ICD-10-CM | POA: Diagnosis not present

## 2015-07-10 ENCOUNTER — Encounter (HOSPITAL_COMMUNITY)
Admission: RE | Admit: 2015-07-10 | Discharge: 2015-07-10 | Disposition: A | Discharge status: Home / Self Care | Payer: Medicare Other | Source: Ambulatory Visit | Attending: Interventional Cardiology | Admitting: Interventional Cardiology

## 2015-07-10 DIAGNOSIS — I2111 ST elevation (STEMI) myocardial infarction involving right coronary artery: Secondary | ICD-10-CM | POA: Diagnosis not present

## 2015-07-13 ENCOUNTER — Other Ambulatory Visit (HOSPITAL_COMMUNITY): Payer: Self-pay | Admitting: Emergency Medicine

## 2015-07-13 ENCOUNTER — Encounter (HOSPITAL_COMMUNITY)
Admission: RE | Admit: 2015-07-13 | Discharge: 2015-07-13 | Disposition: A | Discharge status: Home / Self Care | Payer: Medicare Other | Source: Ambulatory Visit | Attending: Interventional Cardiology | Admitting: Interventional Cardiology

## 2015-07-13 ENCOUNTER — Encounter (HOSPITAL_COMMUNITY): Payer: Self-pay

## 2015-07-13 DIAGNOSIS — I2111 ST elevation (STEMI) myocardial infarction involving right coronary artery: Secondary | ICD-10-CM

## 2015-07-13 NOTE — Progress Notes (Signed)
Cardiac Individual Treatment Plan  Patient Details  Name: Stephen Macdonald MRN: [id number] Date of Birth: 06/18/1945 Referring Provider:  Corky Crafts, MD  Initial Encounter Date:       CARDIAC REHAB PHASE II ORIENTATION from 06/16/2015 in [place] CARDIAC REHABILITATION   Date  06/16/15      Visit Diagnosis: No diagnosis found.  Patient's Home Medications on Admission:  Current outpatient prescriptions:  .  aspirin EC 81 MG tablet, Take 81 mg by mouth daily., Disp: , Rfl:  .  atorvastatin (LIPITOR) 80 MG tablet, Take 1 tablet (80 mg total) by mouth daily., Disp: 30 tablet, Rfl: 11 .  cholecalciferol (VITAMIN D) 1000 UNITS tablet, Take 1,000 Units by mouth daily., Disp: , Rfl:  .  metoprolol succinate (TOPROL-XL) 50 MG 24 hr tablet, Take 50 mg by mouth daily. Take with or immediately following a meal., Disp: , Rfl:  .  miconazole (MICOTIN) 2 % cream, Apply topically 2 (two) times daily., Disp: 28.35 g, Rfl: 0 .  nitroGLYCERIN (NITROSTAT) 0.4 MG SL tablet, Place 1 tablet (0.4 mg total) under the tongue every 5 (five) minutes as needed for chest pain., Disp: 25 tablet, Rfl: 3 .  ticagrelor (BRILINTA) 90 MG TABS tablet, Take 1 tablet (90 mg total) by mouth 2 (two) times daily., Disp: 60 tablet, Rfl: 11 .  valsartan (DIOVAN) 40 MG tablet, Take 40 mg by mouth every evening., Disp: , Rfl:   Past Medical History: Past Medical History  Diagnosis Date  . Coronary artery disease   . Smoker     Tobacco Use: History  Smoking status  . Current Some Day Smoker  . Types: Cigars  Smokeless tobacco  . Not on file    Labs:     Recent Review Flowsheet Data    Labs for ITP Cardiac and Pulmonary Rehab Latest Ref Rng 02/12/2015   TCO2 0 - 100 mmol/L 18      Capillary Blood Glucose: No results found for: GLUCAP   Exercise Target Goals:    Exercise Program Goal: Individual exercise prescription set with THRR, safety & activity barriers. Participant demonstrates ability to  understand and report RPE using BORG scale, to self-measure pulse accurately, and to acknowledge the importance of the exercise prescription.  Exercise Prescription Goal: Starting with aerobic activity 30 plus minutes a day, 3 days per week for initial exercise prescription. Provide home exercise prescription and guidelines that participant acknowledges understanding prior to discharge.  Activity Barriers & Risk Stratification:     Activity Barriers & Cardiac Risk Stratification - 06/16/15 1518    Activity Barriers & Cardiac Risk Stratification   Activity Barriers None   Cardiac Risk Stratification High      6 Minute Walk:     6 Minute Walk      06/16/15 1457       6 Minute Walk   Phase Initial     Distance 1500 feet     Walk Time 6 minutes     # of Rest Breaks 0     MPH 2.84     METS 3.18     RPE 9     Perceived Dyspnea  9     VO2 Peak 10.49     Symptoms No     Resting HR 68 bpm     Resting BP 118/72 mmHg     Max Ex. HR 95 bpm     Max Ex. BP 132/76 mmHg     2 Minute Post  BP 128/74 mmHg     Pre/Post BP   Baseline BP 118/72 mmHg     6 Minute BP 132/76 mmHg     Pre/Post BP? Yes        Initial Exercise Prescription:     Initial Exercise Prescription - 06/16/15 1500    Date of Initial Exercise Prescription   Date 06/16/15   Treadmill   MPH 2   Grade 0   Minutes 15   METs 2.53   NuStep   Level 2   Watts --   Minutes 15   METs --   Recumbant Elliptical   Level 1   Minutes 15   Prescription Details   Frequency (times per week) 3   Intensity   THRR REST +  30   THRR 40-80% of Max Heartrate 101-134   Ratings of Perceived Exertion 11-13   Progression   Progression Continue to progress workloads to maintain intensity without signs/symptoms of physical distress.   Resistance Training   Training Prescription Yes   Weight 2lbs   Reps 10-12      Perform Capillary Blood Glucose checks as needed.  Exercise Prescription Changes:      Exercise  Prescription Changes      07/01/15 1000 07/13/15 1100         Exercise Review   Progression Yes Yes      Response to Exercise   Blood Pressure (Admit) 118/60 mmHg 118/60 mmHg      Blood Pressure (Exercise) 110/62 mmHg 112/62 mmHg      Blood Pressure (Exit) 102/68 mmHg 118/64 mmHg      Heart Rate (Admit) 69 bpm 70 bpm      Heart Rate (Exercise) 90 bpm 92 bpm      Heart Rate (Exit) 78 bpm 79 bpm      Rating of Perceived Exertion (Exercise) 8 8      Symptoms  No      Progression   Progression Continue to progress workloads to maintain intensity without signs/symptoms of physical distress. Continue to progress workloads to maintain intensity without signs/symptoms of physical distress.      Resistance Training   Training Prescription Yes Yes      Weight 2 2      Reps 10-12 10-12      Interval Training   Interval Training No No      Treadmill   MPH 2.4 2.5      Grade 0 0      Minutes 15 15      METs 2.84 2.9      NuStep   Level 2 2      Watts 40 59      Minutes 15 15      METs  3.7      Recumbant Elliptical   Level 1 1      Minutes 15 15      Home Exercise Plan   Plans to continue exercise at Home  Given individual plan. Add 2 days of home exercise           Exercise Comments:    Discharge Exercise Prescription (Final Exercise Prescription Changes):     Exercise Prescription Changes - 07/13/15 1100    Exercise Review   Progression Yes   Response to Exercise   Blood Pressure (Admit) 118/60 mmHg   Blood Pressure (Exercise) 112/62 mmHg   Blood Pressure (Exit) 118/64 mmHg   Heart Rate (Admit) 70 bpm   Heart Rate (Exercise)  92 bpm   Heart Rate (Exit) 79 bpm   Rating of Perceived Exertion (Exercise) 8   Symptoms No   Progression   Progression Continue to progress workloads to maintain intensity without signs/symptoms of physical distress.   Resistance Training   Training Prescription Yes   Weight 2   Reps 10-12   Interval Training   Interval Training No    Treadmill   MPH 2.5   Grade 0   Minutes 15   METs 2.9   NuStep   Level 2   Watts 59   Minutes 15   METs 3.7   Recumbant Elliptical   Level 1   Minutes 15      Nutrition:  Target Goals: Understanding of nutrition guidelines, daily intake of sodium 1500mg , cholesterol 200mg , calories 30% from fat and 7% or less from saturated fats, daily to have 5 or more servings of fruits and vegetables.  Biometrics:     Pre Biometrics - 06/16/15 1615    Pre Biometrics   Height 6\' 1"  (1.854 m)   Weight 241 lb 14.4 oz (109.725 kg)   Waist Circumference 43 inches   Hip Circumference 42 inches   Waist to Hip Ratio 1.02 %   BMI (Calculated) 32   Triceps Skinfold 11 mm   % Body Fat 28.7 %   Grip Strength 97 kg   Flexibility 10 in   Single Leg Stand 25 seconds       Nutrition Therapy Plan and Nutrition Goals:     Nutrition Therapy & Goals - 06/16/15 1528    Intervention Plan   Intervention Prescribe, educate and counsel regarding individualized specific dietary modifications aiming towards targeted core components such as weight, hypertension, lipid management, diabetes, heart failure and other comorbidities.;Nutrition handout(s) given to patient.   Expected Outcomes Short Term Goal: Understand basic principles of dietary content, such as calories, fat, sodium, cholesterol and nutrients.;Long Term Goal: Adherence to prescribed nutrition plan.      Nutrition Discharge: Rate Your Plate Scores:     Nutrition Assessments - 06/16/15 1529    MEDFICTS Scores   Pre Score 65      Nutrition Goals Re-Evaluation:     Nutrition Goals Re-Evaluation      07/13/15 1014           Weight   Current Weight 239 lb 2 oz (108.466 kg)          Psychosocial: Target Goals: Acknowledge presence or absence of depression, maximize coping skills, provide positive support system. Participant is able to verbalize types and ability to use techniques and skills needed for reducing stress and  depression.  Initial Review & Psychosocial Screening:     Initial Psych Review & Screening - 06/16/15 1541    Family Dynamics   Good Support System? Yes   Barriers   Psychosocial barriers to participate in program There are no identifiable barriers or psychosocial needs.   Screening Interventions   Interventions Encouraged to exercise      Quality of Life Scores:     Quality of Life - 06/16/15 1527    Quality of Life Scores   Health/Function Pre 18.14 %   Socioeconomic Pre 23.21 %   Psych/Spiritual Pre 20.75 %   Family Pre 23 %   GLOBAL Pre 20.5 %      PHQ-9:     Recent Review Flowsheet Data    Depression screen [facility] 2/9 06/16/2015   Decreased Interest 1   Down, Depressed, Hopeless 1  PHQ - 2 Score 2   Altered sleeping 0   Tired, decreased energy 0   Change in appetite 0   Feeling bad or failure about yourself  0   Trouble concentrating 0   Moving slowly or fidgety/restless 0   Suicidal thoughts 0   PHQ-9 Score 2   Difficult doing work/chores Somewhat difficult      Psychosocial Evaluation and Intervention:     Psychosocial Evaluation - 06/16/15 1559    Psychosocial Evaluation & Interventions   Interventions Encouraged to exercise with the program and follow exercise prescription   Comments Patient retired [employer] with grown children. No s/s of depression.  Patient states life stressors just decreased due to paying off mortgage,  involved in community game like dominoes.    Continued Psychosocial Services Needed No      Psychosocial Re-Evaluation:   Vocational Rehabilitation: Provide vocational rehab assistance to qualifying candidates.   Vocational Rehab Evaluation & Intervention:     Vocational Rehab - 06/16/15 1522    Initial Vocational Rehab Evaluation & Intervention   Assessment shows need for Vocational Rehabilitation No      Education: Education Goals: Education classes will be provided on a weekly basis, covering required topics.  Participant will state understanding/return demonstration of topics presented.  Learning Barriers/Preferences:     Learning Barriers/Preferences - 06/16/15 1518    Learning Barriers/Preferences   Learning Barriers None   Learning Preferences Pictoral;Skilled Demonstration      Education Topics: Hypertension, Hypertension Reduction -Define heart disease and high blood pressure. Discus how high blood pressure affects the body and ways to reduce high blood pressure.   Exercise and Your Heart -Discuss why it is important to exercise, the FITT principles of exercise, normal and abnormal responses to exercise, and how to exercise safely.   Angina -Discuss definition of angina, causes of angina, treatment of angina, and how to decrease risk of having angina.   Cardiac Medications -Review what the following cardiac medications are used for, how they affect the body, and side effects that may occur when taking the medications.  Medications include Aspirin, Beta blockers, calcium channel blockers, ACE Inhibitors, angiotensin receptor blockers, diuretics, digoxin, and antihyperlipidemics.   Congestive Heart Failure -Discuss the definition of CHF, how to live with CHF, the signs and symptoms of CHF, and how keep track of weight and sodium intake.   Heart Disease and Intimacy -Discus the effect sexual activity has on the heart, how changes occur during intimacy as we age, and safety during sexual activity.      CARDIAC REHAB PHASE II EXERCISE from 07/08/2015 in [place] CARDIAC REHABILITATION   Date  06/24/15   Educator  Hart Rochester   Instruction Review Code  2- meets goals/outcomes      Smoking Cessation / COPD -Discuss different methods to quit smoking, the health benefits of quitting smoking, and the definition of COPD.      CARDIAC REHAB PHASE II EXERCISE from 07/08/2015 in [place] CARDIAC REHABILITATION   Date  07/01/15   Educator  Hart Rochester   Instruction Review Code  2-  meets goals/outcomes      Nutrition I: Fats -Discuss the types of cholesterol, what cholesterol does to the heart, and how cholesterol levels can be controlled.          CARDIAC REHAB PHASE II EXERCISE from 07/08/2015 in [facility] CARDIAC REHABILITATION   Date  07/08/15   Educator  Hart Rochester   Instruction Review Code  2-  meets goals/outcomes      Nutrition II: Labels -Discuss the different components of food labels and how to read food label   Heart Parts and Heart Disease -Discuss the anatomy of the heart, the pathway of blood circulation through the heart, and these are affected by heart disease.   Stress I: Signs and Symptoms -Discuss the causes of stress, how stress may lead to anxiety and depression, and ways to limit stress.   Stress II: Relaxation -Discuss different types of relaxation techniques to limit stress.   Warning Signs of Stroke / TIA -Discuss definition of a stroke, what the signs and symptoms are of a stroke, and how to identify when someone is having stroke.   Knowledge Questionnaire Score:     Knowledge Questionnaire Score - 06/16/15 1519    Knowledge Questionnaire Score   Pre Score 26/28      Core Components/Risk Factors/Patient Goals at Admission:     Personal Goals and Risk Factors at Admission - 06/16/15 1529    Core Components/Risk Factors/Patient Goals on Admission    Weight Management Yes;Obesity   Intervention Weight Management: Develop a combined nutrition and exercise program designed to reach desired caloric intake, while maintaining appropriate intake of nutrient and fiber, sodium and fats, and appropriate energy expenditure required for the weight goal.;Weight Management: Provide education and appropriate resources to help participant work on and attain dietary goals.;Weight Management/Obesity: Establish reasonable short term and long term weight goals.;Obesity: Provide education and appropriate resources to help participant work on  and attain dietary goals.   Admit Weight 241 lb 14.4 oz (109.725 kg)   Goal Weight: Short Term 231 lb (104.781 kg)   Goal Weight: Long Term 226 lb (102.513 kg)   Expected Outcomes Short Term: Continue to assess and modify interventions until short term weight is achieved.;Long Term: Adherence to nutrition and physical activity/exercise program aimed toward attainment of established weight goal.   Sedentary Yes   Intervention Provide advice, education, support and counseling about physical activity/exercise needs.;Develop an individualized exercise prescription for aerobic and resistive training based on initial evaluation findings, risk stratification, comorbidities and participant's personal goals.   Expected Outcomes Achievement of increased cardiorespiratory fitness and enhanced flexibility, muscular endurance and strength shown through measurements of functional capaciy and personal statement of participant.   Tobacco Cessation Yes   Number of packs per day cigars, 4-5 a week  patient does not want to quit   Intervention Assist the participant in steps to quit. Provide individualized education and counseling about committing to Tobacco Cessation, relapse prevention, and pharmacological support that can be provided by physician.   Hypertension Yes   Intervention Provide education on lifestyle modifcations including regular physical activity/exercise, weight management, moderate sodium restriction and increased consumption of fresh fruit, vegetables, and low fat dairy, alcohol moderation, and smoking cessation.;Monitor prescription use compliance.   Expected Outcomes Short Term: Continued assessment and intervention until BP is < 140/69mm HG in hypertensive participants. < 130/64mm HG in hypertensive participants with diabetes, heart failure or chronic kidney disease.;Long Term: Maintenance of blood pressure at goal levels.   Lipids Yes   Intervention Provide education and support for participant  on nutrition & aerobic/resistive exercise along with prescribed medications to achieve LDL 70mg , HDL >40mg .   Expected Outcomes Short Term: Participant states understanding of desired cholesterol values and is compliant with medications prescribed. Participant is following exercise prescription and nutrition guidelines.;Long Term: Cholesterol controlled with medications as prescribed, with individualized exercise RX and with personalized nutrition plan.  Value goals: LDL < 70mg , HDL > 40 mg.      Core Components/Risk Factors/Patient Goals Review:      Goals and Risk Factor Review      07/13/15 1010           Core Components/Risk Factors/Patient Goals Review   Personal Goals Review Weight Management/Obesity;Tobacco Cessation;Hypertension;Lipids       Review Educaton continued on HTN and Lipids.  patient still has no desire to quit the 4-5 cigars.  Patient has lost 2.7 lbs since first session.           Core Components/Risk Factors/Patient Goals at Discharge (Final Review):      Goals and Risk Factor Review - 07/13/15 1010    Core Components/Risk Factors/Patient Goals Review   Personal Goals Review Weight Management/Obesity;Tobacco Cessation;Hypertension;Lipids   Review Educaton continued on HTN and Lipids.  patient still has no desire to quit the 4-5 cigars.  Patient has lost 2.7 lbs since first session.       ITP Comments:     ITP Comments      06/16/15 1524           ITP Comments Orientation ITP completed.  Continue with ITP.           Comments: Goals and exercise prescription reviewed.

## 2015-07-15 ENCOUNTER — Encounter (HOSPITAL_COMMUNITY)
Admission: RE | Admit: 2015-07-15 | Discharge: 2015-07-15 | Disposition: A | Discharge status: Home / Self Care | Payer: Medicare Other | Source: Ambulatory Visit | Attending: Interventional Cardiology | Admitting: Interventional Cardiology

## 2015-07-15 DIAGNOSIS — I2111 ST elevation (STEMI) myocardial infarction involving right coronary artery: Secondary | ICD-10-CM | POA: Diagnosis not present

## 2015-07-17 ENCOUNTER — Encounter (HOSPITAL_COMMUNITY)
Admission: RE | Admit: 2015-07-17 | Discharge: 2015-07-17 | Disposition: A | Discharge status: Home / Self Care | Payer: Medicare Other | Source: Ambulatory Visit | Attending: Interventional Cardiology | Admitting: Interventional Cardiology

## 2015-07-17 DIAGNOSIS — I2111 ST elevation (STEMI) myocardial infarction involving right coronary artery: Secondary | ICD-10-CM | POA: Diagnosis not present

## 2015-07-20 ENCOUNTER — Encounter (HOSPITAL_COMMUNITY)
Admission: RE | Admit: 2015-07-20 | Discharge: 2015-07-20 | Disposition: A | Discharge status: Home / Self Care | Payer: Medicare Other | Source: Ambulatory Visit | Attending: Interventional Cardiology | Admitting: Interventional Cardiology

## 2015-07-20 DIAGNOSIS — I2111 ST elevation (STEMI) myocardial infarction involving right coronary artery: Secondary | ICD-10-CM | POA: Diagnosis not present

## 2015-07-22 ENCOUNTER — Encounter (HOSPITAL_COMMUNITY)
Admission: RE | Admit: 2015-07-22 | Discharge: 2015-07-22 | Disposition: A | Discharge status: Home / Self Care | Payer: Medicare Other | Source: Ambulatory Visit | Attending: Interventional Cardiology | Admitting: Interventional Cardiology

## 2015-07-22 DIAGNOSIS — I2111 ST elevation (STEMI) myocardial infarction involving right coronary artery: Secondary | ICD-10-CM | POA: Diagnosis not present

## 2015-07-24 ENCOUNTER — Encounter (HOSPITAL_COMMUNITY)
Admission: RE | Admit: 2015-07-24 | Discharge: 2015-07-24 | Disposition: A | Discharge status: Home / Self Care | Payer: Medicare Other | Source: Ambulatory Visit | Attending: Interventional Cardiology | Admitting: Interventional Cardiology

## 2015-07-24 DIAGNOSIS — I2111 ST elevation (STEMI) myocardial infarction involving right coronary artery: Secondary | ICD-10-CM | POA: Diagnosis not present

## 2015-07-27 ENCOUNTER — Encounter (HOSPITAL_COMMUNITY)
Admission: RE | Admit: 2015-07-27 | Discharge: 2015-07-27 | Disposition: A | Discharge status: Home / Self Care | Payer: Medicare Other | Source: Ambulatory Visit | Attending: Interventional Cardiology | Admitting: Interventional Cardiology

## 2015-07-27 DIAGNOSIS — I2111 ST elevation (STEMI) myocardial infarction involving right coronary artery: Secondary | ICD-10-CM | POA: Diagnosis not present

## 2015-07-29 ENCOUNTER — Encounter (HOSPITAL_COMMUNITY)
Admission: RE | Admit: 2015-07-29 | Discharge: 2015-07-29 | Disposition: A | Discharge status: Home / Self Care | Payer: Medicare Other | Source: Ambulatory Visit | Attending: Interventional Cardiology | Admitting: Interventional Cardiology

## 2015-07-29 DIAGNOSIS — I2111 ST elevation (STEMI) myocardial infarction involving right coronary artery: Secondary | ICD-10-CM | POA: Diagnosis not present

## 2015-07-31 ENCOUNTER — Encounter (HOSPITAL_COMMUNITY)
Admission: RE | Admit: 2015-07-31 | Discharge: 2015-07-31 | Disposition: A | Discharge status: Home / Self Care | Payer: Medicare Other | Source: Ambulatory Visit | Attending: Interventional Cardiology | Admitting: Interventional Cardiology

## 2015-07-31 DIAGNOSIS — I2111 ST elevation (STEMI) myocardial infarction involving right coronary artery: Secondary | ICD-10-CM | POA: Diagnosis not present

## 2015-08-03 ENCOUNTER — Encounter (HOSPITAL_COMMUNITY)
Admission: RE | Admit: 2015-08-03 | Discharge: 2015-08-03 | Disposition: A | Discharge status: Home / Self Care | Payer: Medicare Other | Source: Ambulatory Visit | Attending: Interventional Cardiology | Admitting: Interventional Cardiology

## 2015-08-03 DIAGNOSIS — I2111 ST elevation (STEMI) myocardial infarction involving right coronary artery: Secondary | ICD-10-CM | POA: Diagnosis not present

## 2015-08-05 ENCOUNTER — Encounter (HOSPITAL_COMMUNITY)
Admission: RE | Admit: 2015-08-05 | Discharge: 2015-08-05 | Disposition: A | Discharge status: Home / Self Care | Payer: Medicare Other | Source: Ambulatory Visit | Attending: Interventional Cardiology | Admitting: Interventional Cardiology

## 2015-08-05 DIAGNOSIS — I2111 ST elevation (STEMI) myocardial infarction involving right coronary artery: Secondary | ICD-10-CM | POA: Diagnosis not present

## 2015-08-07 ENCOUNTER — Encounter (HOSPITAL_COMMUNITY)
Admission: RE | Admit: 2015-08-07 | Discharge: 2015-08-07 | Disposition: A | Discharge status: Home / Self Care | Payer: Medicare Other | Source: Ambulatory Visit | Attending: Interventional Cardiology | Admitting: Interventional Cardiology

## 2015-08-07 DIAGNOSIS — I2111 ST elevation (STEMI) myocardial infarction involving right coronary artery: Secondary | ICD-10-CM | POA: Diagnosis not present

## 2015-08-10 ENCOUNTER — Encounter (HOSPITAL_COMMUNITY)
Admission: RE | Admit: 2015-08-10 | Discharge: 2015-08-10 | Disposition: A | Discharge status: Home / Self Care | Payer: Medicare Other | Source: Ambulatory Visit | Attending: Interventional Cardiology | Admitting: Interventional Cardiology

## 2015-08-10 DIAGNOSIS — I2111 ST elevation (STEMI) myocardial infarction involving right coronary artery: Secondary | ICD-10-CM | POA: Diagnosis not present

## 2015-08-12 ENCOUNTER — Encounter (HOSPITAL_COMMUNITY)
Admission: RE | Admit: 2015-08-12 | Discharge: 2015-08-12 | Disposition: A | Discharge status: Home / Self Care | Payer: Medicare Other | Source: Ambulatory Visit | Attending: Interventional Cardiology | Admitting: Interventional Cardiology

## 2015-08-12 DIAGNOSIS — I2111 ST elevation (STEMI) myocardial infarction involving right coronary artery: Secondary | ICD-10-CM | POA: Diagnosis not present

## 2015-08-14 ENCOUNTER — Encounter (HOSPITAL_COMMUNITY): Payer: Medicare Other

## 2015-08-14 NOTE — Progress Notes (Signed)
Cardiac Individual Treatment Plan  Patient Details  Name: Stephen Macdonald MRN: [id number] Date of Birth: [birth year] Referring Provider:    Initial Encounter Date:       CARDIAC REHAB PHASE II ORIENTATION from 06/16/2015 in [facility]   Date  06/16/15      Visit Diagnosis: No diagnosis found.  Patient's Home Medications on Admission:  Current outpatient prescriptions:  .  aspirin EC 81 MG tablet, Take 81 mg by mouth daily., Disp: , Rfl:  .  atorvastatin (LIPITOR) 80 MG tablet, Take 1 tablet (80 mg total) by mouth daily., Disp: 30 tablet, Rfl: 11 .  cholecalciferol (VITAMIN D) 1000 UNITS tablet, Take 1,000 Units by mouth daily., Disp: , Rfl:  .  metoprolol succinate (TOPROL-XL) 50 MG 24 hr tablet, Take 50 mg by mouth daily. Take with or immediately following a meal., Disp: , Rfl:  .  miconazole (MICOTIN) 2 % cream, Apply topically 2 (two) times daily., Disp: 28.35 g, Rfl: 0 .  nitroGLYCERIN (NITROSTAT) 0.4 MG SL tablet, Place 1 tablet (0.4 mg total) under the tongue every 5 (five) minutes as needed for chest pain., Disp: 25 tablet, Rfl: 3 .  ticagrelor (BRILINTA) 90 MG TABS tablet, Take 1 tablet (90 mg total) by mouth 2 (two) times daily., Disp: 60 tablet, Rfl: 11 .  valsartan (DIOVAN) 40 MG tablet, Take 40 mg by mouth every evening., Disp: , Rfl:   Past Medical History: Past Medical History  Diagnosis Date  . Coronary artery disease   . Smoker     Tobacco Use: History  Smoking status  . Current Some Day Smoker  . Types: Cigars  Smokeless tobacco  . Not on file    Labs: Recent Review Flowsheet Data    Labs for ITP Cardiac and Pulmonary Rehab Latest Ref Rng 02/12/2015   TCO2 0 - 100 mmol/L 18      Capillary Blood Glucose: No results found for: GLUCAP   Exercise Target Goals:    Exercise Program Goal: Individual exercise prescription set with THRR, safety & activity barriers. Participant demonstrates ability to understand and report RPE  using BORG scale, to self-measure pulse accurately, and to acknowledge the importance of the exercise prescription.  Exercise Prescription Goal: Starting with aerobic activity 30 plus minutes a day, 3 days per week for initial exercise prescription. Provide home exercise prescription and guidelines that participant acknowledges understanding prior to discharge.  Activity Barriers & Risk Stratification:     Activity Barriers & Cardiac Risk Stratification - 06/16/15 1518    Activity Barriers & Cardiac Risk Stratification   Activity Barriers None   Cardiac Risk Stratification High      6 Minute Walk:     6 Minute Walk      06/16/15 1457       6 Minute Walk   Phase Initial     Distance 1500 feet     Walk Time 6 minutes     # of Rest Breaks 0     MPH 2.84     METS 3.18     RPE 9     Perceived Dyspnea  9     VO2 Peak 10.49     Symptoms No     Resting HR 68 bpm     Resting BP 118/72 mmHg     Max Ex. HR 95 bpm     Max Ex. BP 132/76 mmHg     2 Minute Post BP 128/74 mmHg  Pre/Post BP   Baseline BP 118/72 mmHg     6 Minute BP 132/76 mmHg     Pre/Post BP? Yes        Initial Exercise Prescription:     Initial Exercise Prescription - 06/16/15 1500    Date of Initial Exercise RX and Referring Provider   Date 06/16/15   Treadmill   MPH 2   Grade 0   Minutes 15   METs 2.53   NuStep   Level 2   Watts --   Minutes 15   METs --   Recumbant Elliptical   Level 1   Minutes 15   Prescription Details   Frequency (times per week) 3   Intensity   THRR REST +  30   THRR 40-80% of Max Heartrate 101-134   Ratings of Perceived Exertion 11-13   Progression   Progression Continue to progress workloads to maintain intensity without signs/symptoms of physical distress.   Resistance Training   Training Prescription Yes   Weight 2lbs   Reps 10-12      Perform Capillary Blood Glucose checks as needed.  Exercise Prescription Changes:     Exercise Prescription Changes       07/01/15 1000 07/13/15 1100 08/12/15 0930       Exercise Review   Progression Yes Yes Yes     Response to Exercise   Blood Pressure (Admit) 118/60 mmHg 118/60 mmHg 114/62 mmHg     Blood Pressure (Exercise) 110/62 mmHg 112/62 mmHg 128/70 mmHg     Blood Pressure (Exit) 102/68 mmHg 118/64 mmHg 114/84 mmHg     Heart Rate (Admit) 69 bpm 70 bpm 61 bpm     Heart Rate (Exercise) 90 bpm 92 bpm 93 bpm     Heart Rate (Exit) 78 bpm 79 bpm 67 bpm     Rating of Perceived Exertion (Exercise) 8 8 10      Symptoms  No No     Duration   Progress to 30 minutes of continuous aerobic without signs/symptoms of physical distress     Intensity   Rest + 30     Progression   Progression Continue to progress workloads to maintain intensity without signs/symptoms of physical distress. Continue to progress workloads to maintain intensity without signs/symptoms of physical distress. Continue to progress workloads to maintain intensity without signs/symptoms of physical distress.     Resistance Training   Training Prescription Yes Yes Yes     Weight 2 2 3      Reps 10-12 10-12 10-12     Interval Training   Interval Training No No No     Treadmill   MPH 2.4 2.5 3     Grade 0 0 1     Minutes 15 15 15      METs 2.84 2.9 3.7     NuStep   Level 2 2 2      Watts 40 59 76     Minutes 15 15 15      METs  3.7 4     Recumbant Elliptical   Level 1 1      Minutes 15 15      Home Exercise Plan   Plans to continue exercise at Home  Given individual plan. Add 2 days of home exercise           Exercise Comments:     Exercise Comments      08/14/15 0904           Exercise Comments Patient  is progressing appropriately.           Discharge Exercise Prescription (Final Exercise Prescription Changes):     Exercise Prescription Changes - 08/12/15 0930    Exercise Review   Progression Yes   Response to Exercise   Blood Pressure (Admit) 114/62 mmHg   Blood Pressure (Exercise) 128/70 mmHg   Blood Pressure  (Exit) 114/84 mmHg   Heart Rate (Admit) 61 bpm   Heart Rate (Exercise) 93 bpm   Heart Rate (Exit) 67 bpm   Rating of Perceived Exertion (Exercise) 10   Symptoms No   Duration Progress to 30 minutes of continuous aerobic without signs/symptoms of physical distress   Intensity Rest + 30   Progression   Progression Continue to progress workloads to maintain intensity without signs/symptoms of physical distress.   Resistance Training   Training Prescription Yes   Weight 3   Reps 10-12   Interval Training   Interval Training No   Treadmill   MPH 3   Grade 1   Minutes 15   METs 3.7   NuStep   Level 2   Watts 76   Minutes 15   METs 4      Nutrition:  Target Goals: Understanding of nutrition guidelines, daily intake of sodium 1500mg , cholesterol 200mg , calories 30% from fat and 7% or less from saturated fats, daily to have 5 or more servings of fruits and vegetables.  Biometrics:     Pre Biometrics - 06/16/15 1615    Pre Biometrics   Height 6\' 1"  (1.854 m)   Weight 241 lb 14.4 oz (109.725 kg)   Waist Circumference 43 inches   Hip Circumference 42 inches   Waist to Hip Ratio 1.02 %   BMI (Calculated) 32   Triceps Skinfold 11 mm   % Body Fat 28.7 %   Grip Strength 97 kg   Flexibility 10 in   Single Leg Stand 25 seconds       Nutrition Therapy Plan and Nutrition Goals:     Nutrition Therapy & Goals - 06/16/15 1528    Intervention Plan   Intervention Prescribe, educate and counsel regarding individualized specific dietary modifications aiming towards targeted core components such as weight, hypertension, lipid management, diabetes, heart failure and other comorbidities.;Nutrition handout(s) given to patient.   Expected Outcomes Short Term Goal: Understand basic principles of dietary content, such as calories, fat, sodium, cholesterol and nutrients.;Long Term Goal: Adherence to prescribed nutrition plan.      Nutrition Discharge: Rate Your Plate Scores:      Nutrition Assessments - 06/16/15 1529    MEDFICTS Scores   Pre Score 65      Nutrition Goals Re-Evaluation:     Nutrition Goals Re-Evaluation      07/13/15 1014           Weight   Current Weight 239 lb 2 oz (108.466 kg)          Psychosocial: Target Goals: Acknowledge presence or absence of depression, maximize coping skills, provide positive support system. Participant is able to verbalize types and ability to use techniques and skills needed for reducing stress and depression.  Initial Review & Psychosocial Screening:     Initial Psych Review & Screening - 06/16/15 1541    Family Dynamics   Good Support System? Yes   Barriers   Psychosocial barriers to participate in program There are no identifiable barriers or psychosocial needs.   Screening Interventions   Interventions Encouraged to exercise  Quality of Life Scores:     Quality of Life - 06/16/15 1527    Quality of Life Scores   Health/Function Pre 18.14 %   Socioeconomic Pre 23.21 %   Psych/Spiritual Pre 20.75 %   Family Pre 23 %   GLOBAL Pre 20.5 %      PHQ-9:     Recent Review Flowsheet Data    Depression screen [facility] 2/9 06/16/2015   Decreased Interest 1   Down, Depressed, Hopeless 1   PHQ - 2 Score 2   Altered sleeping 0   Tired, decreased energy 0   Change in appetite 0   Feeling bad or failure about yourself  0   Trouble concentrating 0   Moving slowly or fidgety/restless 0   Suicidal thoughts 0   PHQ-9 Score 2   Difficult doing work/chores Somewhat difficult      Psychosocial Evaluation and Intervention:     Psychosocial Evaluation - 06/16/15 1559    Psychosocial Evaluation & Interventions   Interventions Encouraged to exercise with the program and follow exercise prescription   Comments 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 No s/s of depression.  Patient states life stressors just decreased due to paying off mortgage,  involved in community game like dominoes.     Continued Psychosocial Services Needed No      Psychosocial Re-Evaluation:     Psychosocial Re-Evaluation      08/14/15 0913           Psychosocial Re-Evaluation   Interventions Encouraged to attend Cardiac Rehabilitation for the exercise;Relaxation education       Continued Psychosocial Services Needed Yes          Vocational Rehabilitation: Provide vocational rehab assistance to qualifying candidates.   Vocational Rehab Evaluation & Intervention:     Vocational Rehab - 06/16/15 1522    Initial Vocational Rehab Evaluation & Intervention   Assessment shows need for Vocational Rehabilitation No      Education: Education Goals: Education classes will be provided on a weekly basis, covering required topics. Participant will state understanding/return demonstration of topics presented.  Learning Barriers/Preferences:     Learning Barriers/Preferences - 06/16/15 1518    Learning Barriers/Preferences   Learning Barriers None   Learning Preferences Pictoral;Skilled Demonstration      Education Topics: Hypertension, Hypertension Reduction -Define heart disease and high blood pressure. Discus how high blood pressure affects the body and ways to reduce high blood pressure.   Exercise and Your Heart -Discuss why it is important to exercise, the FITT principles of exercise, normal and abnormal responses to exercise, and how to exercise safely.   Angina -Discuss definition of angina, causes of angina, treatment of angina, and how to decrease risk of having angina.   Cardiac Medications -Review what the following cardiac medications are used for, how they affect the body, and side effects that may occur when taking the medications.  Medications include Aspirin, Beta blockers, calcium channel blockers, ACE Inhibitors, angiotensin receptor blockers, diuretics, digoxin, and antihyperlipidemics.   Congestive Heart Failure -Discuss the definition of CHF, how to live with  CHF, the signs and symptoms of CHF, and how keep track of weight and sodium intake.   Heart Disease and Intimacy -Discus the effect sexual activity has on the heart, how changes occur during intimacy as we age, and safety during sexual activity.          CARDIAC REHAB PHASE II EXERCISE from 08/12/2015 in [facility]   Date  06/24/15   Educator  Hart Rochester   Instruction Review Code  2- meets goals/outcomes      Smoking Cessation / COPD -Discuss different methods to quit smoking, the health benefits of quitting smoking, and the definition of COPD.      CARDIAC REHAB PHASE II EXERCISE from 08/12/2015 in [place] [facility]   Date  07/01/15   Educator  Hart Rochester   Instruction Review Code  2- meets goals/outcomes      Nutrition I: Fats -Discuss the types of cholesterol, what cholesterol does to the heart, and how cholesterol levels can be controlled.      CARDIAC REHAB PHASE II EXERCISE from 08/12/2015 in [place] [facility]   Date  07/08/15   Educator  Hart Rochester   Instruction Review Code  2- meets goals/outcomes      Nutrition II: Labels -Discuss the different components of food labels and how to read food label      CARDIAC REHAB PHASE II EXERCISE from 08/12/2015 in [place] [facility]   Date  07/15/15   Educator  Hart Rochester   Instruction Review Code  2- meets goals/outcomes      Heart Parts and Heart Disease -Discuss the anatomy of the heart, the pathway of blood circulation through the heart, and these are affected by heart disease.      CARDIAC REHAB PHASE II EXERCISE from 08/12/2015 in [place] [facility]   Date  07/22/15   Educator  Hart Rochester   Instruction Review Code  2- meets goals/outcomes      Stress I: Signs and Symptoms -Discuss the causes of stress, how stress may lead to anxiety and depression, and ways to limit stress.      CARDIAC REHAB PHASE II EXERCISE from 08/12/2015 in [place] [facility]   Date  07/29/15   Educator  Hart Rochester   Instruction Review Code  2- meets goals/outcomes      Stress II: Relaxation -Discuss different types of relaxation techniques to limit stress.      CARDIAC REHAB PHASE II EXERCISE from 08/12/2015 in [place] [facility]IAC [facility]   Date  08/05/15   Educator  Hart Rochester   Instruction Review Code  2- meets goals/outcomes      Warning Signs of Stroke / TIA -Discuss definition of a stroke, what the signs and symptoms are of a stroke, and how to identify when someone is having stroke.      CARDIAC REHAB PHASE II EXERCISE from 08/12/2015 in [place] C[facility]AC [facility]   Date  08/12/15   Educator  Hart Rochester   Instruction Review Code  2- meets goals/outcomes      Knowledge Questionnaire Score:     Knowledge Questionnaire Score - 06/16/15 1519    Knowledge Questionnaire Score   Pre Score 26/28      Core Components/Risk Factors/Patient Goals at Admission:     Personal Goals and Risk Factors at Admission - 06/16/15 1529    Core Components/Risk Factors/Patient Goals on Admission    Weight Management Yes;Obesity   Intervention Weight Management: Develop a combined nutrition and exercise program designed to reach desired caloric intake, while maintaining appropriate intake of nutrient and fiber, sodium and fats, and appropriate energy expenditure required for the weight goal.;Weight Management: Provide education and appropriate resources to help participant work on and attain dietary goals.;Weight Management/Obesity: Establish reasonable short term and long term weight goals.;Obesity: Provide education and appropriate resources to help participant work  on and attain dietary goals.   Admit Weight 241 lb 14.4 oz (109.725 kg)   Goal Weight: Short Term 231 lb (104.781 kg)   Goal Weight: Long Term 226 lb (102.513 kg)   Expected Outcomes Short Term: Continue to assess and modify interventions until short term weight  is achieved.;Long Term: Adherence to nutrition and physical activity/exercise program aimed toward attainment of established weight goal.   Sedentary Yes   Intervention Provide advice, education, support and counseling about physical activity/exercise needs.;Develop an individualized exercise prescription for aerobic and resistive training based on initial evaluation findings, risk stratification, comorbidities and participant's personal goals.   Expected Outcomes Achievement of increased cardiorespiratory fitness and enhanced flexibility, muscular endurance and strength shown through measurements of functional capaciy and personal statement of participant.   Tobacco Cessation Yes   Number of packs per day cigars, 4-5 a week  patient does not want to quit   Intervention Assist the participant in steps to quit. Provide individualized education and counseling about committing to Tobacco Cessation, relapse prevention, and pharmacological support that can be provided by physician.   Hypertension Yes   Intervention Provide education on lifestyle modifcations including regular physical activity/exercise, weight management, moderate sodium restriction and increased consumption of fresh fruit, vegetables, and low fat dairy, alcohol moderation, and smoking cessation.;Monitor prescription use compliance.   Expected Outcomes Short Term: Continued assessment and intervention until BP is < 140/25mm HG in hypertensive participants. < 130/24mm HG in hypertensive participants with diabetes, heart failure or chronic kidney disease.;Long Term: Maintenance of blood pressure at goal levels.   Lipids Yes   Intervention Provide education and support for participant on nutrition & aerobic/resistive exercise along with prescribed medications to achieve LDL 70mg , HDL >40mg .   Expected Outcomes Short Term: Participant states understanding of desired cholesterol values and is compliant with medications prescribed. Participant is  following exercise prescription and nutrition guidelines.;Long Term: Cholesterol controlled with medications as prescribed, with individualized exercise RX and with personalized nutrition plan. Value goals: LDL < 70mg , HDL > 40 mg.      Core Components/Risk Factors/Patient Goals Review:      Goals and Risk Factor Review      07/13/15 1010 08/14/15 0907         Core Components/Risk Factors/Patient Goals Review   Personal Goals Review Weight Management/Obesity;Tobacco Cessation;Hypertension;Lipids Weight Management/Obesity;Tobacco Cessation;Hypertension;Lipids      Review Educaton continued on HTN and Lipids.  patient still has no desire to quit the 4-5 cigars.  Patient has lost 2.7 lbs since first session.  Educaton continued on HTN and Lipids.  patient still has no desire to quit the 4-5 cigars.  Patient has lost 4.9 lbs since first session.          Core Components/Risk Factors/Patient Goals at Discharge (Final Review):      Goals and Risk Factor Review - 08/14/15 0907    Core Components/Risk Factors/Patient Goals Review   Personal Goals Review Weight Management/Obesity;Tobacco Cessation;Hypertension;Lipids   Review Educaton continued on HTN and Lipids.  patient still has no desire to quit the 4-5 cigars.  Patient has lost 4.9 lbs since first session.       ITP Comments:     ITP Comments      06/16/15 1524           ITP Comments Orientation ITP completed.  Continue with ITP.           Comments:      ITP Comments  06/16/15 1524           ITP Comments Orientation ITP completed.  Continue with ITP.

## 2015-08-17 ENCOUNTER — Encounter (HOSPITAL_COMMUNITY)
Admission: RE | Admit: 2015-08-17 | Discharge: 2015-08-17 | Disposition: A | Discharge status: Home / Self Care | Payer: Medicare Other | Source: Ambulatory Visit | Attending: Interventional Cardiology | Admitting: Interventional Cardiology

## 2015-08-17 DIAGNOSIS — I2111 ST elevation (STEMI) myocardial infarction involving right coronary artery: Secondary | ICD-10-CM | POA: Insufficient documentation

## 2015-08-19 ENCOUNTER — Encounter (HOSPITAL_COMMUNITY)
Admission: RE | Admit: 2015-08-19 | Discharge: 2015-08-19 | Disposition: A | Discharge status: Home / Self Care | Payer: Medicare Other | Source: Ambulatory Visit | Attending: Interventional Cardiology | Admitting: Interventional Cardiology

## 2015-08-19 DIAGNOSIS — I2111 ST elevation (STEMI) myocardial infarction involving right coronary artery: Secondary | ICD-10-CM | POA: Diagnosis not present

## 2015-08-21 ENCOUNTER — Encounter (HOSPITAL_COMMUNITY): Payer: Medicare Other

## 2015-08-24 ENCOUNTER — Encounter (HOSPITAL_COMMUNITY)
Admission: RE | Admit: 2015-08-24 | Discharge: 2015-08-24 | Disposition: A | Discharge status: Home / Self Care | Payer: Medicare Other | Source: Ambulatory Visit | Attending: Interventional Cardiology | Admitting: Interventional Cardiology

## 2015-08-24 DIAGNOSIS — I2111 ST elevation (STEMI) myocardial infarction involving right coronary artery: Secondary | ICD-10-CM | POA: Diagnosis not present

## 2015-08-26 ENCOUNTER — Encounter (HOSPITAL_COMMUNITY)
Admission: RE | Admit: 2015-08-26 | Discharge: 2015-08-26 | Disposition: A | Discharge status: Home / Self Care | Payer: Medicare Other | Source: Ambulatory Visit | Attending: Interventional Cardiology | Admitting: Interventional Cardiology

## 2015-08-26 DIAGNOSIS — I2111 ST elevation (STEMI) myocardial infarction involving right coronary artery: Secondary | ICD-10-CM | POA: Diagnosis not present

## 2015-08-28 ENCOUNTER — Encounter (HOSPITAL_COMMUNITY)
Admission: RE | Admit: 2015-08-28 | Discharge: 2015-08-28 | Disposition: A | Discharge status: Home / Self Care | Payer: Medicare Other | Source: Ambulatory Visit | Attending: Interventional Cardiology | Admitting: Interventional Cardiology

## 2015-08-28 DIAGNOSIS — I2111 ST elevation (STEMI) myocardial infarction involving right coronary artery: Secondary | ICD-10-CM | POA: Diagnosis not present

## 2015-08-31 ENCOUNTER — Encounter (HOSPITAL_COMMUNITY)
Admission: RE | Admit: 2015-08-31 | Discharge: 2015-08-31 | Disposition: A | Discharge status: Home / Self Care | Payer: Medicare Other | Source: Ambulatory Visit | Attending: Interventional Cardiology | Admitting: Interventional Cardiology

## 2015-08-31 DIAGNOSIS — I2111 ST elevation (STEMI) myocardial infarction involving right coronary artery: Secondary | ICD-10-CM | POA: Diagnosis not present

## 2015-09-02 ENCOUNTER — Encounter (HOSPITAL_COMMUNITY)
Admission: RE | Admit: 2015-09-02 | Discharge: 2015-09-02 | Disposition: A | Discharge status: Home / Self Care | Payer: Medicare Other | Source: Ambulatory Visit | Attending: Interventional Cardiology | Admitting: Interventional Cardiology

## 2015-09-02 DIAGNOSIS — I2111 ST elevation (STEMI) myocardial infarction involving right coronary artery: Secondary | ICD-10-CM | POA: Diagnosis not present

## 2015-09-04 ENCOUNTER — Encounter (HOSPITAL_COMMUNITY)
Admission: RE | Admit: 2015-09-04 | Discharge: 2015-09-04 | Disposition: A | Discharge status: Home / Self Care | Payer: Medicare Other | Source: Ambulatory Visit | Attending: Interventional Cardiology | Admitting: Interventional Cardiology

## 2015-09-04 DIAGNOSIS — I2111 ST elevation (STEMI) myocardial infarction involving right coronary artery: Secondary | ICD-10-CM | POA: Diagnosis not present

## 2015-09-07 ENCOUNTER — Encounter (HOSPITAL_COMMUNITY)
Admission: RE | Admit: 2015-09-07 | Discharge: 2015-09-07 | Disposition: A | Discharge status: Home / Self Care | Payer: Medicare Other | Source: Ambulatory Visit | Attending: Interventional Cardiology | Admitting: Interventional Cardiology

## 2015-09-07 DIAGNOSIS — I2111 ST elevation (STEMI) myocardial infarction involving right coronary artery: Secondary | ICD-10-CM | POA: Diagnosis not present

## 2015-09-09 ENCOUNTER — Encounter (HOSPITAL_COMMUNITY): Payer: Medicare Other

## 2015-09-11 ENCOUNTER — Encounter (HOSPITAL_COMMUNITY)
Admission: RE | Admit: 2015-09-11 | Discharge: 2015-09-11 | Disposition: A | Discharge status: Home / Self Care | Payer: Medicare Other | Source: Ambulatory Visit | Attending: Interventional Cardiology | Admitting: Interventional Cardiology

## 2015-09-11 DIAGNOSIS — I2111 ST elevation (STEMI) myocardial infarction involving right coronary artery: Secondary | ICD-10-CM

## 2015-09-11 DIAGNOSIS — Z955 Presence of coronary angioplasty implant and graft: Secondary | ICD-10-CM

## 2015-09-11 NOTE — Progress Notes (Signed)
Daily Session Note  Patient Details  Name: [name] MRN: [id number] Date of Birth: 15-Oct-1945 Referring Provider:    Encounter Date: 09/11/2015  Check In:     Session Check In - 09/11/15 0930    Check-In   Location [facility]   Staff Present Melissa Pulido Angelina Pih, MS, EP, [facility], Exercise Physiologist;Christy Oletta Lamas, RN, BSN  Nils Flack, EP   Supervising physician immediately available to respond to emergencies See telemetry face sheet for immediately available MD   Medication changes reported     No   Fall or balance concerns reported    No   Warm-up and Cool-down Performed as group-led instruction   Resistance Training Performed Yes   VAD Patient? No   Pain Assessment   Currently in Pain? No/denies   Multiple Pain Sites No      Capillary Blood Glucose: No results found for this or any previous visit (from the past 24 hour(s)).   Goals Met:  Independence with exercise equipment Changing diet to healthy choices, watching portion sizes Exercise tolerated well No report of cardiac concerns or symptoms Strength training completed today  Goals Unmet:  RPE BP HR  Comments: Patient is progressing appropriately. Check out 1030   Dr. Kate Sable is Medical Director for [facility] Card[facility].

## 2015-09-13 NOTE — Progress Notes (Signed)
Cardiac Individual Treatment Plan  Patient Details  Name: Stephen Macdonald MRN: [id number] Date of Birth: 1946/02/23 Referring Provider:    Initial Encounter Date:       CARDIAC REHAB PHASE II ORIENTATION from 06/16/2015 in [facility]   Date  06/16/15      Visit Diagnosis: ST elevation myocardial infarction (STEMI) involving right coronary artery in recovery phase [facility])  Stented coronary artery  Patient's Home Medications on Admission:  Current outpatient prescriptions:  .  aspirin EC 81 MG tablet, Take 81 mg by mouth daily., Disp: , Rfl:  .  atorvastatin (LIPITOR) 80 MG tablet, Take 1 tablet (80 mg total) by mouth daily., Disp: 30 tablet, Rfl: 11 .  cholecalciferol (VITAMIN D) 1000 UNITS tablet, Take 1,000 Units by mouth daily., Disp: , Rfl:  .  metoprolol succinate (TOPROL-XL) 50 MG 24 hr tablet, Take 50 mg by mouth daily. Take with or immediately following a meal., Disp: , Rfl:  .  miconazole (MICOTIN) 2 % cream, Apply topically 2 (two) times daily., Disp: 28.35 g, Rfl: 0 .  nitroGLYCERIN (NITROSTAT) 0.4 MG SL tablet, Place 1 tablet (0.4 mg total) under the tongue every 5 (five) minutes as needed for chest pain., Disp: 25 tablet, Rfl: 3 .  ticagrelor (BRILINTA) 90 MG TABS tablet, Take 1 tablet (90 mg total) by mouth 2 (two) times daily., Disp: 60 tablet, Rfl: 11 .  valsartan (DIOVAN) 40 MG tablet, Take 40 mg by mouth every evening., Disp: , Rfl:   Past Medical History: Past Medical History  Diagnosis Date  . Coronary artery disease   . Smoker     Tobacco Use: History  Smoking status  . Current Some Day Smoker  . Types: Cigars  Smokeless tobacco  . Not on file    Labs:     Recent Review Flowsheet Data    Labs for ITP Cardiac and Pulmonary Rehab Latest Ref Rng 02/12/2015   TCO2 0 - 100 mmol/L 18      Capillary Blood Glucose: No results found for: GLUCAP   Exercise Target Goals:    Exercise Program Goal: Individual exercise  prescription set with THRR, safety & activity barriers. Participant demonstrates ability to understand and report RPE using BORG scale, to self-measure pulse accurately, and to acknowledge the importance of the exercise prescription.  Exercise Prescription Goal: Starting with aerobic activity 30 plus minutes a day, 3 days per week for initial exercise prescription. Provide home exercise prescription and guidelines that participant acknowledges understanding prior to discharge.  Activity Barriers & Risk Stratification:     Activity Barriers & Cardiac Risk Stratification - 06/16/15 1518    Activity Barriers & Cardiac Risk Stratification   Activity Barriers None   Cardiac Risk Stratification High      6 Minute Walk:     6 Minute Walk      06/16/15 1457       6 Minute Walk   Phase Initial     Distance 1500 feet     Walk Time 6 minutes     # of Rest Breaks 0     MPH 2.84     METS 3.18     RPE 9     Perceived Dyspnea  9     VO2 Peak 10.49     Symptoms No     Resting HR 68 bpm     Resting BP 118/72 mmHg     Max Ex. HR 95 bpm     Max  Ex. BP 132/76 mmHg     2 Minute Post BP 128/74 mmHg     Pre/Post BP   Baseline BP 118/72 mmHg     6 Minute BP 132/76 mmHg     Pre/Post BP? Yes        Initial Exercise Prescription:     Initial Exercise Prescription - 06/16/15 1500    Date of Initial Exercise RX and Referring Provider   Date 06/16/15   Treadmill   MPH 2   Grade 0   Minutes 15   METs 2.53   NuStep   Level 2   Watts --   Minutes 15   METs --   Recumbant Elliptical   Level 1   Minutes 15   Prescription Details   Frequency (times per week) 3   Intensity   THRR REST +  30   THRR 40-80% of Max Heartrate 101-134   Ratings of Perceived Exertion 11-13   Progression   Progression Continue to progress workloads to maintain intensity without signs/symptoms of physical distress.   Resistance Training   Training Prescription Yes   Weight 2lbs   Reps 10-12       Perform Capillary Blood Glucose checks as needed.  Exercise Prescription Changes:      Exercise Prescription Changes      07/01/15 1000 07/13/15 1100 08/12/15 0930 09/13/15 1000     Exercise Review   Progression Yes Yes Yes Yes    Response to Exercise   Blood Pressure (Admit) 118/60 mmHg 118/60 mmHg 114/62 mmHg 126/72 mmHg    Blood Pressure (Exercise) 110/62 mmHg 112/62 mmHg 128/70 mmHg 143/62 mmHg    Blood Pressure (Exit) 102/68 mmHg 118/64 mmHg 114/84 mmHg 124/76 mmHg    Heart Rate (Admit) 69 bpm 70 bpm 61 bpm 70 bpm    Heart Rate (Exercise) 90 bpm 92 bpm 93 bpm 91 bpm    Heart Rate (Exit) 78 bpm 79 bpm 67 bpm 79 bpm    Rating of Perceived Exertion (Exercise) 8 8 10 9     Symptoms  No No No    Duration   Progress to 30 minutes of continuous aerobic without signs/symptoms of physical distress Progress to 30 minutes of continuous aerobic without signs/symptoms of physical distress    Intensity   Rest + 30 Rest + 30    Progression   Progression Continue to progress workloads to maintain intensity without signs/symptoms of physical distress. Continue to progress workloads to maintain intensity without signs/symptoms of physical distress. Continue to progress workloads to maintain intensity without signs/symptoms of physical distress. Continue to progress workloads to maintain intensity without signs/symptoms of physical distress.    Resistance Training   Training Prescription Yes Yes Yes Yes    Weight 2 2 3 5     Reps 10-12 10-12 10-12 10-12    Interval Training   Interval Training No No No No    Treadmill   MPH 2.4 2.5 3 3     Grade 0 0 1 1    Minutes 15 15 15 15     METs 2.84 2.9 3.7 3.7    NuStep   Level 2 2 2 3     Watts 40 59 76 89    Minutes 15 15 15 15     METs  3.7 4 4.4    Recumbant Elliptical   Level 1 1      Minutes 15 15      Home Exercise Plan   Plans to continue exercise at  Home  Given individual plan. Add 2 days of home exercise    [employer]  (comment)    Frequency    Add 2 additional days to program exercise sessions.       Exercise Comments:      Exercise Comments      08/14/15 0904 09/13/15 1036         Exercise Comments Patient is progressing appropriately. Patient is progressing appropriatley.           Discharge Exercise Prescription (Final Exercise Prescription Changes):     Exercise Prescription Changes - 09/13/15 1000    Exercise Review   Progression Yes   Response to Exercise   Blood Pressure (Admit) 126/72 mmHg   Blood Pressure (Exercise) 143/62 mmHg   Blood Pressure (Exit) 124/76 mmHg   Heart Rate (Admit) 70 bpm   Heart Rate (Exercise) 91 bpm   Heart Rate (Exit) 79 bpm   Rating of Perceived Exertion (Exercise) 9   Symptoms No   Duration Progress to 30 minutes of continuous aerobic without signs/symptoms of physical distress   Intensity Rest + 30   Progression   Progression Continue to progress workloads to maintain intensity without signs/symptoms of physical distress.   Resistance Training   Training Prescription Yes   Weight 5   Reps 10-12   Interval Training   Interval Training No   Treadmill   MPH 3   Grade 1   Minutes 15   METs 3.7   NuStep   Level 3   Watts 89   Minutes 15   METs 4.4   Home Exercise Plan   Plans to continue exercise at [employer] (comment)   Frequency Add 2 additional days to program exercise sessions.      Nutrition:  Target Goals: Understanding of nutrition guidelines, daily intake of sodium 1500mg , cholesterol 200mg , calories 30% from fat and 7% or less from saturated fats, daily to have 5 or more servings of fruits and vegetables.  Biometrics:     Pre Biometrics - 06/16/15 1615    Pre Biometrics   Height 6\' 1"  (1.854 m)   Weight 241 lb 14.4 oz (109.725 kg)   Waist Circumference 43 inches   Hip Circumference 42 inches   Waist to Hip Ratio 1.02 %   BMI (Calculated) 32   Triceps Skinfold 11 mm   % Body Fat 28.7 %   Grip Strength 97 kg    Flexibility 10 in   Single Leg Stand 25 seconds       Nutrition Therapy Plan and Nutrition Goals:     Nutrition Therapy & Goals - 06/16/15 1528    Intervention Plan   Intervention Prescribe, educate and counsel regarding individualized specific dietary modifications aiming towards targeted core components such as weight, hypertension, lipid management, diabetes, heart failure and other comorbidities.;Nutrition handout(s) given to patient.   Expected Outcomes Short Term Goal: Understand basic principles of dietary content, such as calories, fat, sodium, cholesterol and nutrients.;Long Term Goal: Adherence to prescribed nutrition plan.      Nutrition Discharge: Rate Your Plate Scores:     Nutrition Assessments - 06/16/15 1529    MEDFICTS Scores   Pre Score 65      Nutrition Goals Re-Evaluation:     Nutrition Goals Re-Evaluation      07/13/15 1014           Weight   Current Weight 239 lb 2 oz (108.466 kg)  Psychosocial: Target Goals: Acknowledge presence or absence of depression, maximize coping skills, provide positive support system. Participant is able to verbalize types and ability to use techniques and skills needed for reducing stress and depression.  Initial Review & Psychosocial Screening:     Initial Psych Review & Screening - 06/16/15 1541    Family Dynamics   Good Support System? Yes   Barriers   Psychosocial barriers to participate in program There are no identifiable barriers or psychosocial needs.   Screening Interventions   Interventions Encouraged to exercise      Quality of Life Scores:     Quality of Life - 06/16/15 1527    Quality of Life Scores   Health/Function Pre 18.14 %   Socioeconomic Pre 23.21 %   Psych/Spiritual Pre 20.75 %   Family Pre 23 %   GLOBAL Pre 20.5 %      PHQ-9:     Recent Review Flowsheet Data    Depression screen [facility] 2/9 06/16/2015   Decreased Interest 1   Down, Depressed, Hopeless 1   PHQ - 2  Score 2   Altered sleeping 0   Tired, decreased energy 0   Change in appetite 0   Feeling bad or failure about yourself  0   Trouble concentrating 0   Moving slowly or fidgety/restless 0   Suicidal thoughts 0   PHQ-9 Score 2   Difficult doing work/chores Somewhat difficult      Psychosocial Evaluation and Intervention:     Psychosocial Evaluation - 06/16/15 1559    Psychosocial Evaluation & Interventions   Interventions Encouraged to exercise with the program and follow exercise prescription   Comments 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 No s/s of depression.  Patient states life stressors just decreased due to paying off mortgage,  involved in community game like dominoes.    Continued Psychosocial Services Needed No      Psychosocial Re-Evaluation:     Psychosocial Re-Evaluation      08/14/15 0913 09/07/15 1448 09/07/15 1450       Psychosocial Re-Evaluation   Interventions Encouraged to attend Cardiac Rehabilitation for the exercise;Relaxation education Encouraged to attend Cardiac Rehabilitation for the exercise;Relaxation education      Continued Psychosocial Services Needed Yes  No        Vocational Rehabilitation: Provide vocational rehab assistance to qualifying candidates.   Vocational Rehab Evaluation & Intervention:     Vocational Rehab - 06/16/15 1522    Initial Vocational Rehab Evaluation & Intervention   Assessment shows need for Vocational Rehabilitation No      Education: Education Goals: Education classes will be provided on a weekly basis, covering required topics. Participant will state understanding/return demonstration of topics presented.  Learning Barriers/Preferences:     Learning Barriers/Preferences - 06/16/15 1518    Learning Barriers/Preferences   Learning Barriers None   Learning Preferences Pictoral;Skilled Demonstration      Education Topics: Hypertension, Hypertension Reduction -Define heart disease and high blood  pressure. Discus how high blood pressure affects the body and ways to reduce high blood pressure.      CARDIAC REHAB PHASE II EXERCISE from 09/02/2015 in [place] [facility]   Date  08/19/15   Educator  Hart Rochester   Instruction Review Code  2- meets goals/outcomes      Exercise and Your Heart -Discuss why it is important to exercise, the FITT principles of exercise, normal and abnormal responses to exercise, and how to exercise safely.  CARDIAC REHAB PHASE II EXERCISE from 09/02/2015 in [place] [facility]   Date  08/26/15   Educator  Hart Rochester   Instruction Review Code  2- meets goals/outcomes      Angina -Discuss definition of angina, causes of angina, treatment of angina, and how to decrease risk of having angina.      CARDIAC REHAB PHASE II EXERCISE from 09/02/2015 in [facility]   Date  09/02/15   Educator  DCoad   Instruction Review Code  2- meets goals/outcomes      Cardiac Medications -Review what the following cardiac medications are used for, how they affect the body, and side effects that may occur when taking the medications.  Medications include Aspirin, Beta blockers, calcium channel blockers, ACE Inhibitors, angiotensin receptor blockers, diuretics, digoxin, and antihyperlipidemics.   Congestive Heart Failure -Discuss the definition of CHF, how to live with CHF, the signs and symptoms of CHF, and how keep track of weight and sodium intake.   Heart Disease and Intimacy -Discus the effect sexual activity has on the heart, how changes occur during intimacy as we age, and safety during sexual activity.      CARDIAC REHAB PHASE II EXERCISE from 09/02/2015 in [place] [facility]   Date  06/24/15   Educator  Hart Rochester   Instruction Review Code  2- meets goals/outcomes      Smoking Cessation / COPD -Discuss different methods to quit smoking, the health benefits of quitting smoking, and the definition  of COPD.      CARDIAC REHAB PHASE II EXERCISE from 09/02/2015 in [place] [facility]   Date  07/01/15   Educator  Hart Rochester   Instruction Review Code  2- meets goals/outcomes      Nutrition I: Fats -Discuss the types of cholesterol, what cholesterol does to the heart, and how cholesterol levels can be controlled.      CARDIAC REHAB PHASE II EXERCISE from 09/02/2015 in [place] [facility]   Date  07/08/15   Educator  Hart Rochester   Instruction Review Code  2- meets goals/outcomes      Nutrition II: Labels -Discuss the different components of food labels and how to read food label      CARDIAC REHAB PHASE II EXERCISE from 09/02/2015 in [place] [facility]   Date  07/15/15   Educator  Hart Rochester   Instruction Review Code  2- meets goals/outcomes      Heart Parts and Heart Disease -Discuss the anatomy of the heart, the pathway of blood circulation through the heart, and these are affected by heart disease.      CARDIAC REHAB PHASE II EXERCISE from 09/02/2015 in [place] [facility]   Date  07/22/15   Educator  Hart Rochester   Instruction Review Code  2- meets goals/outcomes      Stress I: Signs and Symptoms -Discuss the causes of stress, how stress may lead to anxiety and depression, and ways to limit stress.      CARDIAC REHAB PHASE II EXERCISE from 09/02/2015 in [place] [facility]   Date  07/29/15   Educator  Hart Rochester   Instruction Review Code  2- meets goals/outcomes      Stress II: Relaxation -Discuss different types of relaxation techniques to limit stress.      CARDIAC REHAB PHASE II EXERCISE from 09/02/2015 in [facility] [facility]   Date  08/05/15   Educator  Hart Rochester  Instruction Review Code  2- meets goals/outcomes      Warning Signs of Stroke / TIA -Discuss definition of a stroke, what the signs and symptoms are of a stroke, and how to identify when someone is having stroke.           CARDIAC REHAB PHASE II EXERCISE from 09/02/2015 in [place] [facility]   Date  08/12/15   Educator  Hart Rochester   Instruction Review Code  2- meets goals/outcomes      Knowledge Questionnaire Score:     Knowledge Questionnaire Score - 06/16/15 1519    Knowledge Questionnaire Score   Pre Score 26/28      Core Components/Risk Factors/Patient Goals at Admission:     Personal Goals and Risk Factors at Admission - 06/16/15 1529    Core Components/Risk Factors/Patient Goals on Admission    Weight Management Yes;Obesity   Intervention Weight Management: Develop a combined nutrition and exercise program designed to reach desired caloric intake, while maintaining appropriate intake of nutrient and fiber, sodium and fats, and appropriate energy expenditure required for the weight goal.;Weight Management: Provide education and appropriate resources to help participant work on and attain dietary goals.;Weight Management/Obesity: Establish reasonable short term and long term weight goals.;Obesity: Provide education and appropriate resources to help participant work on and attain dietary goals.   Admit Weight 241 lb 14.4 oz (109.725 kg)   Goal Weight: Short Term 231 lb (104.781 kg)   Goal Weight: Long Term 226 lb (102.513 kg)   Expected Outcomes Short Term: Continue to assess and modify interventions until short term weight is achieved.;Long Term: Adherence to nutrition and physical activity/exercise program aimed toward attainment of established weight goal.   Sedentary Yes   Intervention Provide advice, education, support and counseling about physical activity/exercise needs.;Develop an individualized exercise prescription for aerobic and resistive training based on initial evaluation findings, risk stratification, comorbidities and participant's personal goals.   Expected Outcomes Achievement of increased cardiorespiratory fitness and enhanced flexibility, muscular endurance and  strength shown through measurements of functional capaciy and personal statement of participant.   Tobacco Cessation Yes   Number of packs per day cigars, 4-5 a week  patient does not want to quit   Intervention Assist the participant in steps to quit. Provide individualized education and counseling about committing to Tobacco Cessation, relapse prevention, and pharmacological support that can be provided by physician.   Hypertension Yes   Intervention Provide education on lifestyle modifcations including regular physical activity/exercise, weight management, moderate sodium restriction and increased consumption of fresh fruit, vegetables, and low fat dairy, alcohol moderation, and smoking cessation.;Monitor prescription use compliance.   Expected Outcomes Short Term: Continued assessment and intervention until BP is < 140/53mm HG in hypertensive participants. < 130/55mm HG in hypertensive participants with diabetes, heart failure or chronic kidney disease.;Long Term: Maintenance of blood pressure at goal levels.   Lipids Yes   Intervention Provide education and support for participant on nutrition & aerobic/resistive exercise along with prescribed medications to achieve LDL 70mg , HDL >40mg .   Expected Outcomes Short Term: Participant states understanding of desired cholesterol values and is compliant with medications prescribed. Participant is following exercise prescription and nutrition guidelines.;Long Term: Cholesterol controlled with medications as prescribed, with individualized exercise RX and with personalized nutrition plan. Value goals: LDL < 70mg , HDL > 40 mg.      Core Components/Risk Factors/Patient Goals Review:      Goals and Risk Factor Review      07/13/15 1010  08/14/15 5409 09/07/15 1443       Core Components/Risk Factors/Patient Goals Review   Personal Goals Review Weight Management/Obesity;Tobacco Cessation;Hypertension;Lipids Weight Management/Obesity;Tobacco  Cessation;Hypertension;Lipids Weight Management/Obesity;Tobacco Cessation;Hypertension;Lipids     Review Educaton continued on HTN and Lipids.  patient still has no desire to quit the 4-5 cigars.  Patient has lost 2.7 lbs since first session.  Educaton continued on HTN and Lipids.  patient still has no desire to quit the 4-5 cigars.  Patient has lost 4.9 lbs since first session.  Educaton continued on HTN and Lipids.  patient still has no desire to quit the 4-5 cigars.  Pt has lost 7 lbs since starting program with obvious waist reduction. Patients BP has been controlled.      Expected Outcomes   controlled HTN and lipids. wight lose. tobacco cessation.         Core Components/Risk Factors/Patient Goals at Discharge (Final Review):      Goals and Risk Factor Review - 09/07/15 1443    Core Components/Risk Factors/Patient Goals Review   Personal Goals Review Weight Management/Obesity;Tobacco Cessation;Hypertension;Lipids   Review Educaton continued on HTN and Lipids.  patient still has no desire to quit the 4-5 cigars.  Pt has lost 7 lbs since starting program with obvious waist reduction. Patients BP has been controlled.    Expected Outcomes controlled HTN and lipids. wight lose. tobacco cessation.       ITP Comments:     ITP Comments      06/16/15 1524           ITP Comments Orientation ITP completed.  Continue with ITP.           Comments: Patient is doing well with program. Will continue to monitor for progress.

## 2015-09-16 ENCOUNTER — Encounter (HOSPITAL_COMMUNITY)
Admission: RE | Admit: 2015-09-16 | Discharge: 2015-09-16 | Disposition: A | Discharge status: Home / Self Care | Payer: Medicare Other | Source: Ambulatory Visit | Attending: Interventional Cardiology | Admitting: Interventional Cardiology

## 2015-09-16 DIAGNOSIS — I2111 ST elevation (STEMI) myocardial infarction involving right coronary artery: Secondary | ICD-10-CM | POA: Diagnosis not present

## 2015-09-16 NOTE — Progress Notes (Signed)
Daily Session Note  Patient Details  Name: QUASHON JESUS MRN: [id number] Date of Birth: [birth year] Referring Provider:    Encounter Date: 09/16/2015  Check In:     Session Check In - 09/16/15 1115    Check-In   Location [facility]   Staff Present Diane Angelina Pih, MS, EP, [facility], Exercise Physiologist;Carline Dura Oletta Lamas, RN, BSN   Supervising physician immediately available to respond to emergencies See telemetry face sheet for immediately available MD   Medication changes reported     No   Fall or balance concerns reported    No   Warm-up and Cool-down Performed as group-led instruction   Resistance Training Performed Yes   VAD Patient? No   Pain Assessment   Currently in Pain? No/denies      Capillary Blood Glucose: No results found for this or any previous visit (from the past 24 hour(s)).   Goals Met:  Independence with exercise equipment Exercise tolerated well No report of cardiac concerns or symptoms Strength training completed today  Goals Unmet:  Not Applicable  Comments: check out 1030   Dr. Kate Sable is Medical Director for [place] and Pulmonary Rehab.

## 2015-09-18 ENCOUNTER — Encounter (HOSPITAL_COMMUNITY)
Admission: RE | Admit: 2015-09-18 | Discharge: 2015-09-18 | Disposition: A | Discharge status: Home / Self Care | Payer: Medicare Other | Source: Ambulatory Visit | Attending: Interventional Cardiology | Admitting: Interventional Cardiology

## 2015-09-18 DIAGNOSIS — I2111 ST elevation (STEMI) myocardial infarction involving right coronary artery: Secondary | ICD-10-CM | POA: Insufficient documentation

## 2015-09-18 NOTE — Progress Notes (Signed)
Daily Session Note  Patient Details  Name: SOLON ALBAN MRN: [id number] Date of Birth: [birth year] Referring Provider:    Encounter Date: 09/18/2015  Check In:     Session Check In - 09/18/15 0930    Check-In   Location [facility]   Staff Present Russella Dar, MS, EP, [facility], Exercise Physiologist;Christy Oletta Lamas, RN, BSN   Supervising physician immediately available to respond to emergencies See telemetry face sheet for immediately available MD   Medication changes reported     No   Fall or balance concerns reported    No   Warm-up and Cool-down Performed as group-led instruction   Resistance Training Performed Yes   VAD Patient? No   Pain Assessment   Currently in Pain? No/denies   Multiple Pain Sites No      Capillary Blood Glucose: No results found for this or any previous visit (from the past 24 hour(s)).   Goals Met:  Independence with exercise equipment Exercise tolerated well No report of cardiac concerns or symptoms Strength training completed today  Goals Unmet:  Not Applicable  Comments: Check out: 10:30   Dr. Kate Sable is Medical Director for [place] and Pulmonary Rehab.

## 2015-09-21 ENCOUNTER — Encounter (HOSPITAL_COMMUNITY)
Admission: RE | Admit: 2015-09-21 | Discharge: 2015-09-21 | Disposition: A | Discharge status: Home / Self Care | Payer: Medicare Other | Source: Ambulatory Visit | Attending: Interventional Cardiology | Admitting: Interventional Cardiology

## 2015-09-21 DIAGNOSIS — I2111 ST elevation (STEMI) myocardial infarction involving right coronary artery: Secondary | ICD-10-CM | POA: Diagnosis not present

## 2015-09-21 NOTE — Progress Notes (Signed)
Daily Session Note  Patient Details  Name: Stephen Macdonald MRN: [id number] Date of Birth: 1946/01/16 Referring Provider:    Encounter Date: 09/21/2015  Check In:     Session Check In - 09/21/15 0930    Check-In   Location [facility]   Staff Present Makaelah Cranfield Angelina Pih, MS, EP, [facility], Exercise Physiologist;Christy Oletta Lamas, RN, BSN  Nils Flack, EP   Supervising physician immediately available to respond to emergencies See telemetry face sheet for immediately available MD   Medication changes reported     No   Fall or balance concerns reported    No   Warm-up and Cool-down Performed as group-led instruction   Resistance Training Performed Yes   VAD Patient? No   Pain Assessment   Currently in Pain? No/denies   Multiple Pain Sites No      Capillary Blood Glucose: No results found for this or any previous visit (from the past 24 hour(s)).   Goals Met:  Independence with exercise equipment Exercise tolerated well No report of cardiac concerns or symptoms Strength training completed today  Goals Unmet:  Not Applicable  Comments: Check Out 10:30   Dr. Kate Sable is Medical Director for [facility].

## 2015-09-23 ENCOUNTER — Encounter (HOSPITAL_COMMUNITY)
Admission: RE | Admit: 2015-09-23 | Discharge: 2015-09-23 | Disposition: A | Discharge status: Home / Self Care | Payer: Medicare Other | Source: Ambulatory Visit | Attending: Interventional Cardiology | Admitting: Interventional Cardiology

## 2015-09-25 ENCOUNTER — Encounter (HOSPITAL_COMMUNITY): Payer: Medicare Other

## 2015-10-12 NOTE — Addendum Note (Signed)
Encounter addended by: Joette Catching on: 10/12/2015  2:33 PM<BR>     Documentation filed: Normajean Glasgow VN

## 2015-11-05 NOTE — Addendum Note (Signed)
Encounter addended by: Rolene Course on: 11/05/2015  4:34 PM<BR>     Documentation filed: Clinical Notes, Flowsheet VN, Visit Diagnoses

## 2015-11-05 NOTE — Addendum Note (Signed)
Encounter addended by: Rolene Course on: 11/05/2015  4:36 PM<BR>     Documentation filed: Episodes, Clinical Notes

## 2015-11-05 NOTE — Progress Notes (Signed)
Discharge Summary  Patient Details  Name: Stephen Macdonald MRN: [id number] Date of Birth: [birth year] Referring Provider:     Number of Visits: 36  Reason for Discharge:  Patient reached a stable level of exercise. Patient independent in their exercise.  Smoking History:  History  Smoking status  . Current Some Day Smoker  . Types: Cigars  Smokeless tobacco  . Not on file    Diagnosis:  ST elevation myocardial infarction (STEMI) involving right coronary artery in recovery phase [facility])  ADL UCSD:   Initial Exercise Prescription:     Initial Exercise Prescription - 06/16/15 1500    Date of Initial Exercise RX and Referring Provider   Date 06/16/15   Treadmill   MPH 2   Grade 0   Minutes 15   METs 2.53   NuStep   Level 2   Watts --   Minutes 15   METs --   Recumbant Elliptical   Level 1   Minutes 15   Prescription Details   Frequency (times per week) 3   Intensity   THRR REST +  30   THRR 40-80% of Max Heartrate 101-134   Ratings of Perceived Exertion 11-13   Progression   Progression Continue to progress workloads to maintain intensity without signs/symptoms of physical distress.   Resistance Training   Training Prescription Yes   Weight 2lbs   Reps 10-12      Discharge Exercise Prescription (Final Exercise Prescription Changes):     Exercise Prescription Changes - 10/15/15 1000    Exercise Review   Progression Yes   Response to Exercise   Blood Pressure (Admit) 112/68 mmHg   Blood Pressure (Exercise) 138/60 mmHg   Blood Pressure (Exit) 110/60 mmHg   Heart Rate (Admit) 80 bpm   Heart Rate (Exercise) 91 bpm   Heart Rate (Exit) 83 bpm   Rating of Perceived Exertion (Exercise) 10   Symptoms No   Comments Patient's last session 09/21/15   Duration Progress to 30 minutes of continuous aerobic without signs/symptoms of physical distress   Intensity Rest + 30   Progression   Progression Continue to progress workloads to maintain intensity without  signs/symptoms of physical distress.   Resistance Training   Training Prescription Yes   Weight 5   Reps 10-12   Interval Training   Interval Training No   Treadmill   MPH 3.1   Grade 1   Minutes 15   METs 3.7   NuStep   Level 3   Watts 127   Minutes 20   METs 3.7   Home Exercise Plan   Plans to continue exercise at [employer] (comment)   Frequency Add 2 additional days to program exercise sessions.      Functional Capacity:     6 Minute Walk      06/16/15 1457 10/12/15 1421     6 Minute Walk   Phase Initial Discharge    Distance 1500 feet 1850 feet    Distance % Change  23.3 %    Walk Time 6 minutes 6 minutes    # of Rest Breaks 0 0    MPH 2.84 3.5    METS 3.18 3.68    RPE 9 9    Perceived Dyspnea  9 9    VO2 Peak 10.49 12.74    Symptoms No No    Resting HR 68 bpm 80 bpm    Resting BP 118/72 mmHg 112/68 mmHg    Max Ex.  HR 95 bpm 100 bpm    Max Ex. BP 132/76 mmHg 122/68 mmHg    2 Minute Post BP 128/74 mmHg 112/68 mmHg    Pre/Post BP   Baseline BP 118/72 mmHg 112/68 mmHg    6 Minute BP 132/76 mmHg 122/68 mmHg    Pre/Post BP? Yes Yes       Psychological, QOL, Others - Outcomes: PHQ 2/9: Depression screen [facility] 2/9 11/05/2015 06/16/2015  Decreased Interest 1 1  Down, Depressed, Hopeless 0 1  PHQ - 2 Score 1 2  Altered sleeping 0 0  Tired, decreased energy 1 0  Change in appetite 0 0  Feeling bad or failure about yourself  0 0  Trouble concentrating 0 0  Moving slowly or fidgety/restless 0 0  Suicidal thoughts 0 0  PHQ-9 Score 2 2  Difficult doing work/chores Somewhat difficult Somewhat difficult    Quality of Life:     Quality of Life - 10/12/15 1430    Quality of Life Scores   Health/Function Pre 18.14 %   Health/Function Post 25.53 %   Health/Function % Change 40.74 %   Socioeconomic Pre 23.21 %   Socioeconomic Post 25.43 %   Socioeconomic % Change  9.56 %   Psych/Spiritual Pre 20.75 %   Psych/Spiritual Post 24.93 %    Psych/Spiritual % Change 20.14 %   Family Pre 23 %   Family Post 27.6 %   Family % Change 20 %   GLOBAL Pre 20.5 %   GLOBAL Post 25.69 %   GLOBAL % Change 25.32 %      Personal Goals: Goals established at orientation with interventions provided to work toward goal.     Personal Goals and Risk Factors at Admission - 06/16/15 1529    Core Components/Risk Factors/Patient Goals on Admission    Weight Management Yes;Obesity   Intervention Weight Management: Develop a combined nutrition and exercise program designed to reach desired caloric intake, while maintaining appropriate intake of nutrient and fiber, sodium and fats, and appropriate energy expenditure required for the weight goal.;Weight Management: Provide education and appropriate resources to help participant work on and attain dietary goals.;Weight Management/Obesity: Establish reasonable short term and long term weight goals.;Obesity: Provide education and appropriate resources to help participant work on and attain dietary goals.   Admit Weight 241 lb 14.4 oz (109.725 kg)   Goal Weight: Short Term 231 lb (104.781 kg)   Goal Weight: Long Term 226 lb (102.513 kg)   Expected Outcomes Short Term: Continue to assess and modify interventions until short term weight is achieved.;Long Term: Adherence to nutrition and physical activity/exercise program aimed toward attainment of established weight goal.   Sedentary Yes   Intervention Provide advice, education, support and counseling about physical activity/exercise needs.;Develop an individualized exercise prescription for aerobic and resistive training based on initial evaluation findings, risk stratification, comorbidities and participant's personal goals.   Expected Outcomes Achievement of increased cardiorespiratory fitness and enhanced flexibility, muscular endurance and strength shown through measurements of functional capaciy and personal statement of participant.   Tobacco Cessation  Yes   Number of packs per day cigars, 4-5 a week  patient does not want to quit   Intervention Assist the participant in steps to quit. Provide individualized education and counseling about committing to Tobacco Cessation, relapse prevention, and pharmacological support that can be provided by physician.   Hypertension Yes   Intervention Provide education on lifestyle modifcations including regular physical activity/exercise, weight management, moderate sodium restriction and increased  consumption of fresh fruit, vegetables, and low fat dairy, alcohol moderation, and smoking cessation.;Monitor prescription use compliance.   Expected Outcomes Short Term: Continued assessment and intervention until BP is < 140/36mm HG in hypertensive participants. < 130/71mm HG in hypertensive participants with diabetes, heart failure or chronic kidney disease.;Long Term: Maintenance of blood pressure at goal levels.   Lipids Yes   Intervention Provide education and support for participant on nutrition & aerobic/resistive exercise along with prescribed medications to achieve LDL 70mg , HDL >40mg .   Expected Outcomes Short Term: Participant states understanding of desired cholesterol values and is compliant with medications prescribed. Participant is following exercise prescription and nutrition guidelines.;Long Term: Cholesterol controlled with medications as prescribed, with individualized exercise RX and with personalized nutrition plan. Value goals: LDL < , HDL > 40 mg.       Personal Goals Discharge:     Goals and Risk Factor Review      07/13/15 1010 08/14/15 0907 09/07/15 1443 11/05/15 1629     Core Components/Risk Factors/Patient Goals Review   Personal Goals Review Weight Management/Obesity;Tobacco Cessation;Hypertension;Lipids Weight Management/Obesity;Tobacco Cessation;Hypertension;Lipids Weight Management/Obesity;Tobacco Cessation;Hypertension;Lipids     Review Educaton continued on HTN and Lipids.   patient still has no desire to quit the 4-5 cigars.  Patient has lost 2.7 lbs since first session.  Educaton continued on HTN and Lipids.  patient still has no desire to quit the 4-5 cigars.  Patient has lost 4.9 lbs since first session.  Educaton continued on HTN and Lipids.  patient still has no desire to quit the 4-5 cigars.  Pt has lost 7 lbs since starting program with obvious waist reduction. Patients BP has been controlled.  Patient did lose 6.9 lbs at graduation, sitll smokes 4-5 cigars has no desire to quit, gained grip strength and was more flexible.     Expected Outcomes   controlled HTN and lipids. wight lose. tobacco cessation.  All goals reached expect quitting smoking.        Nutrition & Weight - Outcomes:     Pre Biometrics - 06/16/15 1615    Pre Biometrics   Height  (1.854 m)   Weight 241 lb 14.4 oz (109.725 kg)   Waist Circumference 43 inches   Hip Circumference 42 inches   Waist to Hip Ratio 1.02 %   BMI (Calculated) 32   Triceps Skinfold 11 mm   % Body Fat 28.7 %   Grip Strength 97 kg   Flexibility 10 in   Single Leg Stand 25 seconds         Post Biometrics - 10/12/15 1423     Post  Biometrics   Height  (1.854 m)   Weight 235 lb 7.2 oz (106.8 kg)   Waist Circumference 43 inches   Hip Circumference 44 inches   Waist to Hip Ratio 0.98 %   BMI (Calculated) 31.1   Triceps Skinfold 9 mm   % Body Fat 27.6 %   Grip Strength 100.67 kg   Flexibility 11.73 in   Single Leg Stand 9 seconds      Nutrition:     Nutrition Therapy & Goals - 06/16/15 1528    Intervention Plan   Intervention Prescribe, educate and counsel regarding individualized specific dietary modifications aiming towards targeted core components such as weight, hypertension, lipid management, diabetes, heart failure and other comorbidities.;Nutrition handout(s) given to patient.   Expected Outcomes Short Term Goal: Understand basic principles of dietary content, such as calories, fat,  sodium, cholesterol and  nutrients.;Long Term Goal: Adherence to prescribed nutrition plan.      Nutrition Discharge:     Nutrition Assessments - 11/05/15 1627    MEDFICTS Scores   Pre Score 65   Post Score 32   Score Difference -33      Education Questionnaire Score:     Knowledge Questionnaire Score - 11/05/15 1626    Knowledge Questionnaire Score   Pre Score 26/28   Post Score 25/28      Goals reviewed with patient; copy given to patient.

## 2016-04-15 ENCOUNTER — Emergency Department (HOSPITAL_COMMUNITY): Payer: Medicare Other

## 2016-04-15 ENCOUNTER — Other Ambulatory Visit: Payer: Self-pay

## 2016-04-15 ENCOUNTER — Inpatient Hospital Stay (HOSPITAL_COMMUNITY)
Admission: EM | Admit: 2016-04-15 | Discharge: 2016-04-18 | DRG: 247 | Disposition: A | Discharge status: Home / Self Care | Payer: Medicare Other | Attending: Cardiovascular Disease | Admitting: Cardiovascular Disease

## 2016-04-15 ENCOUNTER — Encounter (HOSPITAL_COMMUNITY): Payer: Self-pay | Admitting: Emergency Medicine

## 2016-04-15 DIAGNOSIS — Z885 Allergy status to narcotic agent status: Secondary | ICD-10-CM | POA: Diagnosis not present

## 2016-04-15 DIAGNOSIS — N400 Enlarged prostate without lower urinary tract symptoms: Secondary | ICD-10-CM | POA: Diagnosis present

## 2016-04-15 DIAGNOSIS — I25810 Atherosclerosis of coronary artery bypass graft(s) without angina pectoris: Secondary | ICD-10-CM | POA: Diagnosis not present

## 2016-04-15 DIAGNOSIS — Z95810 Presence of automatic (implantable) cardiac defibrillator: Secondary | ICD-10-CM | POA: Diagnosis not present

## 2016-04-15 DIAGNOSIS — I219 Acute myocardial infarction, unspecified: Secondary | ICD-10-CM | POA: Diagnosis not present

## 2016-04-15 DIAGNOSIS — Z7982 Long term (current) use of aspirin: Secondary | ICD-10-CM

## 2016-04-15 DIAGNOSIS — Z951 Presence of aortocoronary bypass graft: Secondary | ICD-10-CM | POA: Diagnosis not present

## 2016-04-15 DIAGNOSIS — I130 Hypertensive heart and chronic kidney disease with heart failure and stage 1 through stage 4 chronic kidney disease, or unspecified chronic kidney disease: Secondary | ICD-10-CM | POA: Diagnosis present

## 2016-04-15 DIAGNOSIS — Z79899 Other long term (current) drug therapy: Secondary | ICD-10-CM | POA: Diagnosis not present

## 2016-04-15 DIAGNOSIS — E785 Hyperlipidemia, unspecified: Secondary | ICD-10-CM | POA: Diagnosis present

## 2016-04-15 DIAGNOSIS — I071 Rheumatic tricuspid insufficiency: Secondary | ICD-10-CM | POA: Diagnosis present

## 2016-04-15 DIAGNOSIS — I10 Essential (primary) hypertension: Secondary | ICD-10-CM

## 2016-04-15 DIAGNOSIS — F17290 Nicotine dependence, other tobacco product, uncomplicated: Secondary | ICD-10-CM | POA: Diagnosis present

## 2016-04-15 DIAGNOSIS — N183 Chronic kidney disease, stage 3 unspecified: Secondary | ICD-10-CM

## 2016-04-15 DIAGNOSIS — E7800 Pure hypercholesterolemia, unspecified: Secondary | ICD-10-CM | POA: Diagnosis not present

## 2016-04-15 DIAGNOSIS — I2510 Atherosclerotic heart disease of native coronary artery without angina pectoris: Secondary | ICD-10-CM | POA: Diagnosis present

## 2016-04-15 DIAGNOSIS — I5022 Chronic systolic (congestive) heart failure: Secondary | ICD-10-CM | POA: Diagnosis present

## 2016-04-15 DIAGNOSIS — I214 Non-ST elevation (NSTEMI) myocardial infarction: Secondary | ICD-10-CM | POA: Diagnosis present

## 2016-04-15 DIAGNOSIS — I255 Ischemic cardiomyopathy: Secondary | ICD-10-CM | POA: Diagnosis present

## 2016-04-15 HISTORY — DX: Chronic kidney disease, stage 3 unspecified: N18.30

## 2016-04-15 HISTORY — DX: Pure hypercholesterolemia, unspecified: E78.00

## 2016-04-15 HISTORY — DX: Chronic kidney disease, stage 3 (moderate): N18.3

## 2016-04-15 HISTORY — DX: Essential (primary) hypertension: I10

## 2016-04-15 HISTORY — DX: Ischemic cardiomyopathy: I25.5

## 2016-04-15 HISTORY — DX: Chronic combined systolic (congestive) and diastolic (congestive) heart failure: I50.42

## 2016-04-15 LAB — COMPREHENSIVE METABOLIC PANEL
ALBUMIN: 3.8 g/dL (ref 3.5–5.0)
ALK PHOS: 73 U/L (ref 38–126)
ALT: 33 U/L (ref 17–63)
ANION GAP: 5 (ref 5–15)
AST: 44 U/L — ABNORMAL HIGH (ref 15–41)
BILIRUBIN TOTAL: 1.1 mg/dL (ref 0.3–1.2)
BUN: 18 mg/dL (ref 6–20)
CALCIUM: 8.5 mg/dL — AB (ref 8.9–10.3)
CO2: 27 mmol/L (ref 22–32)
CREATININE: 1.33 mg/dL — AB (ref 0.61–1.24)
Chloride: 101 mmol/L (ref 101–111)
GFR calc non Af Amer: 52 mL/min — ABNORMAL LOW (ref >60)
GLUCOSE: 124 mg/dL — AB (ref 65–99)
Potassium: 3.5 mmol/L (ref 3.5–5.1)
Sodium: 133 mmol/L — ABNORMAL LOW (ref 135–145)
TOTAL PROTEIN: 6.7 g/dL (ref 6.5–8.1)

## 2016-04-15 LAB — TROPONIN I: Troponin I: 2.7 ng/mL (ref <0.03)

## 2016-04-15 LAB — CBC
HEMATOCRIT: 44.6 % (ref 39.0–52.0)
HEMOGLOBIN: 15.2 g/dL (ref 13.0–17.0)
MCH: 32.2 pg (ref 26.0–34.0)
MCHC: 34.1 g/dL (ref 30.0–36.0)
MCV: 94.5 fL (ref 78.0–100.0)
Platelets: 175 10*3/uL (ref 150–400)
RBC: 4.72 MIL/uL (ref 4.22–5.81)
RDW: 12.3 % (ref 11.5–15.5)
WBC: 7.1 10*3/uL (ref 4.0–10.5)

## 2016-04-15 LAB — I-STAT TROPONIN, ED: Troponin i, poc: 2.09 ng/mL (ref 0.00–0.08)

## 2016-04-15 MED ORDER — ONDANSETRON HCL 4 MG/2ML IJ SOLN: 4.0000 mg | Freq: Four times a day (QID) | INTRAMUSCULAR | Status: DC | PRN

## 2016-04-15 MED ORDER — METOPROLOL SUCCINATE ER 25 MG PO TB24
25.0000 mg | ORAL_TABLET | Freq: Every morning | ORAL | Status: DC
  Administered 2016-04-16 – 2016-04-18 (×3): 25 mg via ORAL
  Filled 2016-04-15 (×3): qty 1

## 2016-04-15 MED ORDER — IRBESARTAN 75 MG PO TABS
37.5000 mg | ORAL_TABLET | Freq: Every day | ORAL | Status: DC
Start: 2016-04-15 — End: 2016-04-18
  Administered 2016-04-15 – 2016-04-18 (×4): 37.5 mg via ORAL
  Filled 2016-04-15 (×5): qty 0.5

## 2016-04-15 MED ORDER — ACETAMINOPHEN 325 MG PO TABS
650.0000 mg | ORAL_TABLET | ORAL | Status: DC | PRN
  Administered 2016-04-16: 650 mg via ORAL
  Filled 2016-04-15: qty 2

## 2016-04-15 MED ORDER — ASPIRIN EC 81 MG PO TBEC
81.0000 mg | DELAYED_RELEASE_TABLET | Freq: Every day | ORAL | Status: DC
  Administered 2016-04-16 – 2016-04-18 (×3): 81 mg via ORAL
  Filled 2016-04-15 (×3): qty 1

## 2016-04-15 MED ORDER — TICAGRELOR 90 MG PO TABS
180.0000 mg | ORAL_TABLET | Freq: Once | ORAL | Status: AC
  Administered 2016-04-15: 180 mg via ORAL
  Filled 2016-04-15: qty 2

## 2016-04-15 MED ORDER — TAMSULOSIN HCL 0.4 MG PO CAPS
0.4000 mg | ORAL_CAPSULE | Freq: Every morning | ORAL | Status: DC
  Administered 2016-04-16 – 2016-04-18 (×3): 0.4 mg via ORAL
  Filled 2016-04-15 (×3): qty 1

## 2016-04-15 MED ORDER — NITROGLYCERIN 2 % TD OINT
0.5000 [in_us] | TOPICAL_OINTMENT | Freq: Four times a day (QID) | TRANSDERMAL | Status: DC
  Administered 2016-04-15 – 2016-04-16 (×3): 0.5 [in_us] via TOPICAL
  Filled 2016-04-15: qty 30
  Filled 2016-04-15: qty 1

## 2016-04-15 MED ORDER — TICAGRELOR 90 MG PO TABS
90.0000 mg | ORAL_TABLET | Freq: Two times a day (BID) | ORAL | Status: DC
  Administered 2016-04-16 – 2016-04-18 (×5): 90 mg via ORAL
  Filled 2016-04-15 (×5): qty 1

## 2016-04-15 MED ORDER — ASPIRIN 81 MG PO CHEW
324.0000 mg | CHEWABLE_TABLET | Freq: Once | ORAL | Status: AC
  Administered 2016-04-15: 243 mg via ORAL
  Filled 2016-04-15: qty 4

## 2016-04-15 MED ORDER — HEPARIN (PORCINE) IN NACL 100-0.45 UNIT/ML-% IJ SOLN
1350.0000 [IU]/h | INTRAMUSCULAR | Status: DC
  Administered 2016-04-15: 1250 [IU]/h via INTRAVENOUS
  Filled 2016-04-15: qty 250

## 2016-04-15 MED ORDER — NITROGLYCERIN 0.4 MG SL SUBL: 0.4000 mg | SUBLINGUAL_TABLET | SUBLINGUAL | Status: DC | PRN

## 2016-04-15 MED ORDER — ATORVASTATIN CALCIUM 80 MG PO TABS
80.0000 mg | ORAL_TABLET | Freq: Every day | ORAL | Status: DC
  Administered 2016-04-15 – 2016-04-17 (×3): 80 mg via ORAL
  Filled 2016-04-15 (×3): qty 1

## 2016-04-15 MED ORDER — VITAMIN D 1000 UNITS PO TABS
1000.0000 [IU] | ORAL_TABLET | Freq: Every day | ORAL | Status: DC
  Administered 2016-04-15 – 2016-04-17 (×3): 1000 [IU] via ORAL
  Filled 2016-04-15 (×3): qty 1

## 2016-04-15 NOTE — ED Triage Notes (Signed)
Pt reports cp in central chest radiating down left arm since Wednesday night.  Pt has ICD placed.  Denies n/v/dizziness/sob.  Pt has had cold sweats, has taken nitro with relief.  EKG performed and given to Dr. Clayborne Dana.

## 2016-04-15 NOTE — ED Notes (Signed)
Pt denies use of ED medications in last 24 hours. States he took 1 81mg  ASA this AM.

## 2016-04-15 NOTE — ED Notes (Signed)
CRITICAL VALUE ALERT  Critical value received:  Troponin 2.70  Date of notification:  04/15/2016  Time of notification:  1920  Critical value read back:Yes.    Nurse who received alert:  bkn  MD notified (1st page):  Mesner  Time of first page: 1922  MD notified (2nd page):  Time of second page:  Responding MD:    Time MD responded:

## 2016-04-15 NOTE — H&P (Signed)
History and Physical  Primary Cardiologist: [facility] PCP: Maricela Curet, MD  Chief Complaint: NSTEMI  HPI:  Stephen Macdonald is a 70 year old male with history of CKD III, CAD s/p CABG 1997 and ICM s/p AICD. His cardiologist is at [facility].   Regarding his history, he had a STEMI 02/12/2015 and cath showed showed EF 25-35%, severe native 3v CAD, 100% acute occlusion of mid SVG to RCA treated with 3.5 mm Synergy stents with distal embolic protection, patent LIMA to LAD, LIMA to OM Y graft (one of his grafts I suspect is a left free radial graft given physical exam). 80% ost D2 lesion treated medically. He was started on Brillinta. He has since followed up with the [facility], no records available to [place] at this time of course since then other than completion of cardiac rehab.  He notes no issues, completed 1 year of DAPT with Brillinta without issues.    He presented to [facility] ED on 04/15/16 with chest pain for several days, troponin-I 2.70.  He was started on IV heparin and given 324 ASA and a half inch of nitropaste.  He notes several days of classic stuttering chest pain that rest that accelerated just prior to presentation and radiated down left arm and up neck, relieved with NTG SL.  Currently pain free after nitropaste.    He has upcoming plans for routine coloscopy and hydrocele surgery.  Otherwise on ROS, no SOB, edema, N/V, PND, orthopnea.  Family history of mother with MI 52.  He smoked cigarettes for years and still smokes cigars.  Occasional bourbon.  Former Clinical cytogeneticist, retired.   Prior Studies Cardiac catheterization 02/12/2015 Conclusion     There is severe left ventricular systolic dysfunction.  Severe native three vessel CAD.  Mid SVG to RCA Graft lesion, 100% stenosed which is the culprit vessel. Post intervention with overlapping 3.5 mm Synergy stents with distal embolic protection, there is a 0% residual stenosis.  Patent LIMA to LAD, LIMA to OM  Y-graft.  Mid Graft lesion, 100% stenosed. Post intervention, there is a 0% residual stenosis.   Continue aggressive medical therapy.  There is residual severe disease in the diagonal which is not bypassed.  It appears there is a Y graft from the LIMA that supply the circumflex and the LAD.   He will be watched in the ICU.  He will be in the hospital for at least two days.  Medical therapy for his LV dysfunction.  I stressed the importance of DAPT for a year.   Add back CHF meds as BP allows.    Echocardiogram 02/16/2015 LV EF: 25% -   30%  ------------------------------------------------------------------- Indications:      Chest pain 786.51.  ------------------------------------------------------------------- History:   PMH:  Acute inferior myocardial infarction. ST elevation MI involving RCA. S/P CABG.  Risk factors:  Dyslipidemia.  ------------------------------------------------------------------- Study Conclusions  - Left ventricle: The cavity size was severely dilated. Wall   thickness was increased in a pattern of moderate LVH. Systolic   function was severely reduced. The estimated ejection fraction   was in the range of 25% to 30%. Akinesis of the inferior and   apical myocardium. - Mitral valve: There was mild regurgitation. - Left atrium: The atrium was moderately dilated. - Right atrium: The atrium was mildly dilated.    ECG: (personally reviewed) A paced with LAD and old ant MI with poor R wave progression and septal QW and inferolateral ST-T wave changes  Past Medical History:  Diagnosis Date  . CHF (congestive heart failure) ([place])   . Coronary artery disease   . High cholesterol   . Hypertension   . Smoker     Past Surgical History:  Procedure Laterality Date  . CARDIAC CATHETERIZATION N/A 02/12/2015   Procedure: Left Heart Cath and Coronary Angiography;  Surgeon: Jettie Booze, MD;  Location: [place] [facility];  Service: Cardiovascular;   Laterality: N/A;  . CARDIAC CATHETERIZATION N/A 02/12/2015   Procedure: Coronary Stent Intervention;  Surgeon: Jettie Booze, MD;  Location: [place] [facility];  Service: Cardiovascular;  Laterality: N/A;  . CARDIAC CATHETERIZATION  02/12/2015   Procedure: Left Heart Cath and Cors/Grafts Angiography;  Surgeon: Jettie Booze, MD;  Location: [place] [facility];  Service: Cardiovascular;;  . CARDIAC DEFIBRILLATOR PLACEMENT    . CORONARY ARTERY BYPASS GRAFT     x 3  . CORONARY STENT PLACEMENT    . PACEMAKER INSERTION      History reviewed. No pertinent family history. Social History:  reports that he has been smoking Cigars.  He does not have any smokeless tobacco history on file. He reports that he does not drink alcohol or use drugs. Family history of mother with MI 40.  He smoked cigarettes for years and still smokes cigars.  Occasional bourbon.  Former Clinical cytogeneticist, retired.    Allergies:  Allergies  Allergen Reactions  . Codeine Other (See Comments)    Tingling skin. "makes my skin crawl"    No current facility-administered medications on file prior to encounter.    Current Outpatient Prescriptions on File Prior to Encounter  Medication Sig Dispense Refill  . aspirin EC 81 MG tablet Take 81 mg by mouth every morning.     Marland Kitchen atorvastatin (LIPITOR) 80 MG tablet Take 1 tablet (80 mg total) by mouth daily. (Patient taking differently: Take 80 mg by mouth at bedtime. ) 30 tablet 11  . cholecalciferol (VITAMIN D) 1000 UNITS tablet Take 1,000 Units by mouth at bedtime.     . nitroGLYCERIN (NITROSTAT) 0.4 MG SL tablet Place 1 tablet (0.4 mg total) under the tongue every 5 (five) minutes as needed for chest pain. 25 tablet 3  . valsartan (DIOVAN) 40 MG tablet Take 40 mg by mouth at bedtime.        Results for orders placed or performed during the hospital encounter of 04/15/16 (from the past 48 hour(s))  CBC     Status: None   Collection Time: 04/15/16  6:21 PM  Result  Value Ref Range   WBC 7.1 4.0 - 10.5 K/uL   RBC 4.72 4.22 - 5.81 MIL/uL   Hemoglobin 15.2 13.0 - 17.0 g/dL   HCT 44.6 39.0 - 52.0 %   MCV 94.5 78.0 - 100.0 fL   MCH 32.2 26.0 - 34.0 pg   MCHC 34.1 30.0 - 36.0 g/dL   RDW 12.3 11.5 - 15.5 %   Platelets 175 150 - 400 K/uL  Comprehensive metabolic panel     Status: Abnormal   Collection Time: 04/15/16  6:21 PM  Result Value Ref Range   Sodium 133 (L) 135 - 145 mmol/L   Potassium 3.5 3.5 - 5.1 mmol/L   Chloride 101 101 - 111 mmol/L   CO2 27 22 - 32 mmol/L   Glucose, Bld 124 (H) 65 - 99 mg/dL   BUN 18 6 - 20 mg/dL   Creatinine, Ser 1.33 (H) 0.61 - 1.24 mg/dL   Calcium  8.5 (L) 8.9 - 10.3 mg/dL   Total Protein 6.7 6.5 - 8.1 g/dL   Albumin 3.8 3.5 - 5.0 g/dL   AST 44 (H) 15 - 41 U/L   ALT 33 17 - 63 U/L   Alkaline Phosphatase 73 38 - 126 U/L   Total Bilirubin 1.1 0.3 - 1.2 mg/dL   GFR calc non Af Amer 52 (L) >60 mL/min   GFR calc Af Amer >60 >60 mL/min    Comment: (NOTE) The eGFR has been calculated using the CKD EPI equation. This calculation has not been validated in all clinical situations. eGFR's persistently <60 mL/min signify possible Chronic Kidney Disease.    Anion gap 5 5 - 15  Troponin I     Status: Abnormal   Collection Time: 04/15/16  6:21 PM  Result Value Ref Range   Troponin I 2.70 (HH) <0.03 ng/mL    Comment: CRITICAL RESULT CALLED TO, READ BACK BY AND VERIFIED WITH: NORMAN,B ON 04/15/16 AT 1920 BY LOY,C   I-stat troponin, ED     Status: Abnormal   Collection Time: 04/15/16  6:36 PM  Result Value Ref Range   Troponin i, poc 2.09 (HH) 0.00 - 0.08 ng/mL   Comment NOTIFIED PHYSICIAN    Comment 3            Comment: Due to the release kinetics of cTnI, a negative result within the first hours of the onset of symptoms does not rule out myocardial infarction with certainty. If myocardial infarction is still suspected, repeat the test at appropriate intervals.    Dg Chest Portable 1 View  Result Date:  04/15/2016 CLINICAL DATA:  Patient states right upper chest pain that went across his chest into his left arm today. Hx of CHF, coronary artery disease, hypertension, pacemaker insertion, coronary stent placement, CABG x 3, cardiac cath. Current smoker. EXAM: PORTABLE CHEST 1 VIEW COMPARISON:  11/10/2007 FINDINGS: Left-sided transvenous pacemaker leads to the right atrium, right ventricle, and coronary sinus. Status post median sternotomy and CABG. The heart is mildly enlarged. There are no focal consolidations or pleural effusions. No pulmonary edema. IMPRESSION: No active disease. Electronically Signed   By: Nolon Nations M.D.   On: 04/15/2016 19:40  S/P ICD  With RV lead, abandoned RV lead, atrial lead and A-pacing on ECG (Per my review of Xray looks like abandoned RV lead; not CS lead as per the XRay read; he confirmed this on history)  ROS: As above. Otherwise, review of systems is negative unless per above HPI  Vitals:   04/15/16 1900 04/15/16 1930 04/15/16 2000 04/15/16 2100  BP: 104/85 102/82 101/65 117/78  Pulse: 63 67 65   Resp: _0 Temp:    98 F (36.7 C)  TempSrc:    Oral  SpO2: 93% 93% 94% 94%  Weight:    108.1 kg (238 lb 5.1 oz)  Height:    _1  (1.854 m)   Wt Readings from Last 10 Encounters:  04/15/16 108.1 kg (238 lb 5.1 oz)  10/12/15 106.8 kg (235 lb 7.2 oz)  06/16/15 109.7 kg (241 lb 14.4 oz)  02/16/15 106.8 kg (235 lb 8 oz)    PE:  General: No acute distress HEENT: Atraumatic, EOMI, mucous membranes moist CV: Distant. RRR no murmurs, gallops. No JVD at 45 degrees. No HJR. Respiratory: Clear, no crackles. Normal work of breathing; mild exp wheezes. ABD: Non-distended and non-tender. No palpable organomegaly.  Extremities: 2+ radial pulse right, long incisional scar  left radial likely as conduit for previous CABG.  No LE edema. Neuro/Psych: CN grossly intact, alert and oriented  Assessment/Plan 70 YO male with CAD /p CABG 1997 and ICM s/p AICD with  vein graft intervention ~14 months ago for STEMI presenting with chest pain, troponin elevation, NSTEMI. Currently pain free.    Problem List: NSTEMI CAD history s/p CABG and PCI to SVG to RCA 54/6503 Chronic systolic heart failure last EF 25-30% S/P ICD  With RV lead, abandoned RV lead, atrial lead and A-pacing on ECG (Per my review of Xray looks like abandoned RV lead; not CS lead as per the XRay read; he confirmed this on history) HTN HLD CKD stage III BPH  Plan:  - Likely needs repeat catheterization for NSTEMI, asymptomatic now.  Will load upstream with P2Y12 given previous CABG (Ticagrelor 180 mg x 1, then 90 mg BID).  He tolerated DAPT with Brillinta well previously. NPO q 24 for LHC-- may opt to do tomorrow if lab is closed on New Year's day Monday. - ASA 81 mg, continue heparin drip - TTE ordered -  A1c, TSH, repeat lipids, BNP - Continue home atorvastatin 80 mg, Toprol 25 mg, valsartan exchanged for equivalent irbesartan (37.5 mg)  Lolita Cram Stephen Rodger  MD 04/15/2016, 9:53 PM

## 2016-04-15 NOTE — ED Notes (Signed)
Report given to [place], [facility].

## 2016-04-15 NOTE — Progress Notes (Signed)
ANTICOAGULATION CONSULT NOTE - Initial Consult  Pharmacy Consult for HEPARIN Indication: chest pain/ACS  Allergies  Allergen Reactions  . Codeine Other (See Comments)    Tingling skin. "makes my skin crawl"   Patient Measurements: Height: 6\' 1"  (185.4 cm) Weight: 240 lb (108.9 kg) IBW/kg (Calculated) : 79.9 HEPARIN DW (KG): 102.6  Vital Signs: Temp: 98.3 F (36.8 C) (12/29 1819) Temp Source: Oral (12/29 1819) BP: 104/85 (12/29 1900) Pulse Rate: 63 (12/29 1900)  Labs:  Recent Labs  04/15/16 1821  HGB 15.2  HCT 44.6  PLT 175  CREATININE 1.33*  TROPONINI 2.70*   Estimated Creatinine Clearance: 65.9 mL/min (by C-G formula based on SCr of 1.33 mg/dL (H)).  Medical History: Past Medical History:  Diagnosis Date  . CHF (congestive heart failure) (HCC)   . Coronary artery disease   . High cholesterol   . Hypertension   . Smoker    Medications:   (Not in a hospital admission)  Assessment: 70yo male presents to ED c/o CP.  Asked to initiate Heparin for ACS.  Baseline aPTT and protime pending.  No anticoagulants listed on PTA med list.    Goal of Therapy:  Heparin level 0.3-0.7 units/ml Monitor platelets by anticoagulation protocol: Yes   Plan:  Heparin 4000 unit bolus if baseline labs WNL Heparin infusion at 1250 units/hr Heparin level and CBC daily.  Valrie Hart A 04/15/2016,7:26 PM

## 2016-04-15 NOTE — ED Provider Notes (Signed)
[facility]Y DEPT Provider Note   CSN: [id number] Arrival date & time: 04/15/16  1810     History   Chief Complaint Chief Complaint  Patient presents with  . Chest Pain    HPI Stephen Macdonald is a 70 y.o. male.   Chest Pain   This is a recurrent problem. The current episode started 2 days ago. The problem occurs daily. The problem has been gradually worsening. The pain is associated with exertion. The pain is present in the substernal region and lateral region. The pain is moderate. The quality of the pain is described as heavy and pressure-like. The pain radiates to the left jaw, left neck, left shoulder and left arm. Associated symptoms include diaphoresis. Pertinent negatives include no abdominal pain and no nausea. He has tried nitroglycerin for the symptoms. The treatment provided significant relief. Risk factors include being elderly, male gender and obesity.  His past medical history is significant for CAD.    Past Medical History:  Diagnosis Date  . CHF (congestive heart failure) (HCC)   . Coronary artery disease   . High cholesterol   . Hypertension   . Smoker     Patient Active Problem List   Diagnosis Date Noted  . NSTEMI (non-ST elevated myocardial infarction) (HCC) 04/15/2016  . HTN (hypertension) 04/15/2016  . Chronic systolic heart failure (HCC) 04/15/2016  . CKD (chronic kidney disease), stage III 04/15/2016  . BPH (benign prostatic hyperplasia) 04/15/2016  . ST elevation myocardial infarction involving right coronary artery (HCC)   . S/P CABG x 2   . Hyperlipidemia   . NSVT (nonsustained ventricular tachycardia) (HCC)   . Acute inferior myocardial infarction [facility]) 02/12/2015    Past Surgical History:  Procedure Laterality Date  . CARDIAC CATHETERIZATION N/A 02/12/2015   Procedure: Left Heart Cath and Coronary Angiography;  Surgeon: Corky Crafts, MD;  Location: [facility] INVASIVE CV LAB;  Service: Cardiovascular;  Laterality: N/A;  . CARDIAC  CATHETERIZATION N/A 02/12/2015   Procedure: Coronary Stent Intervention;  Surgeon: Corky Crafts, MD;  Location: [facility] INVASIVE CV LAB;  Service: Cardiovascular;  Laterality: N/A;  . CARDIAC CATHETERIZATION  02/12/2015   Procedure: Left Heart Cath and Cors/Grafts Angiography;  Surgeon: Corky Crafts, MD;  Location: [facility] INVASIVE CV LAB;  Service: Cardiovascular;;  . CARDIAC DEFIBRILLATOR PLACEMENT    . CORONARY ARTERY BYPASS GRAFT     x 3  . CORONARY STENT PLACEMENT    . PACEMAKER INSERTION         Home Medications    Prior to Admission medications   Medication Sig Start Date End Date Taking? Authorizing Provider  aspirin EC 81 MG tablet Take 81 mg by mouth every morning.    Yes Historical Provider, MD  atorvastatin (LIPITOR) 80 MG tablet Take 1 tablet (80 mg total) by mouth daily. Patient taking differently: Take 80 mg by mouth at bedtime.  02/16/15  Yes Azalee Course, PA  cholecalciferol (VITAMIN D) 1000 UNITS tablet Take 1,000 Units by mouth at bedtime.    Yes Historical Provider, MD  metoprolol succinate (TOPROL-XL) 25 MG 24 hr tablet Take 25 mg by mouth every morning.   Yes Historical Provider, MD  nitroGLYCERIN (NITROSTAT) 0.4 MG SL tablet Place 1 tablet (0.4 mg total) under the tongue every 5 (five) minutes as needed for chest pain. 02/16/15  Yes Azalee Course, PA  tamsulosin (FLOMAX) 0.4 MG CAPS capsule Take 0.4 mg by mouth every morning.   Yes Historical Provider, MD  valsartan (DIOVAN) 40  MG tablet Take 40 mg by mouth at bedtime.    Yes Historical Provider, MD    Family History History reviewed. No pertinent family history.  Social History Social History  Substance Use Topics  . Smoking status: Current Some Day Smoker    Types: Cigars  . Smokeless tobacco: Not on file  . Alcohol use No     Allergies   Codeine   Review of Systems Review of Systems  Constitutional: Positive for diaphoresis.  Cardiovascular: Positive for chest pain.  Gastrointestinal: Negative for  abdominal pain and nausea.  All other systems reviewed and are negative.    Physical Exam Updated Vital Signs BP 117/81 (BP Location: Right Arm)   Pulse 65   Temp 97.7 F (36.5 C) (Oral)   Resp 15   Ht 6\' 1"  (1.854 m)   Wt 238 lb 5.1 oz (108.1 kg)   SpO2 94%   BMI 31.44 kg/m   Physical Exam  Constitutional: He appears well-developed and well-nourished.  HENT:  Head: Normocephalic and atraumatic.  Eyes: Conjunctivae and EOM are normal.  Neck: Normal range of motion.  Cardiovascular: Normal rate.   Pulmonary/Chest: Effort normal. No respiratory distress.  Abdominal: Soft. He exhibits no distension.  Musculoskeletal: Normal range of motion. He exhibits no edema or deformity.  Neurological: He is alert.  Skin: Skin is warm and dry.  Nursing note and vitals reviewed.    ED Treatments / Results  Labs (all labs ordered are listed, but only abnormal results are displayed) Labs Reviewed  COMPREHENSIVE METABOLIC PANEL - Abnormal; Notable for the following:       Result Value   Sodium 133 (*)    Glucose, Bld 124 (*)    Creatinine, Ser 1.33 (*)    Calcium 8.5 (*)    AST 44 (*)    GFR calc non Af Amer 52 (*)    All other components within normal limits  TROPONIN I - Abnormal; Notable for the following:    Troponin I 2.70 (*)    All other components within normal limits  TROPONIN I - Abnormal; Notable for the following:    Troponin I 2.99 (*)    All other components within normal limits  I-STAT TROPOININ, ED - Abnormal; Notable for the following:    Troponin i, poc 2.09 (*)    All other components within normal limits  MRSA PCR SCREENING  CBC  APTT  PROTIME-INR  HEPARIN LEVEL (UNFRACTIONATED)  CBC  TROPONIN I  TROPONIN I  TSH  HEMOGLOBIN A1C  BRAIN NATRIURETIC PEPTIDE  LIPID PANEL  BASIC METABOLIC PANEL    EKG  EKG Interpretation  Date/Time:  Friday April 15 2016 18:17:27 EST Ventricular Rate:  81 PR Interval:    QRS Duration: 91 QT  Interval:  406 QTC Calculation: 472 R Axis:   -53 Text Interpretation:  Sinus rhythm Probable left atrial enlargement LAD, consider left anterior fascicular block Anterior infarct, old Borderline repolarization abnormality No significant change since last tracing Confirmed by [facility] MD, Barbara Cower [phone]) on 04/15/2016 6:56:31 PM       Radiology Dg Chest Portable 1 View  Result Date: 04/15/2016 CLINICAL DATA:  Patient states right upper chest pain that went across his chest into his left arm today. Hx of CHF, coronary artery disease, hypertension, pacemaker insertion, coronary stent placement, CABG x 3, cardiac cath. Current smoker. EXAM: PORTABLE CHEST 1 VIEW COMPARISON:  11/10/2007 FINDINGS: Left-sided transvenous pacemaker leads to the right atrium, right ventricle, and coronary sinus. Status  post median sternotomy and CABG. The heart is mildly enlarged. There are no focal consolidations or pleural effusions. No pulmonary edema. IMPRESSION: No active disease. Electronically Signed   By: Norva PavlovElizabeth  Brown M.D.   On: 04/15/2016 19:40    Procedures Procedures (including critical care time)  CRITICAL CARE Performed by: Marily MemosMesner, Burt Piatek Total critical care time: 35 minutes Critical care time was exclusive of separately billable procedures and treating other patients. Critical care was necessary to treat or prevent imminent or life-threatening deterioration. Critical care was time spent personally by me on the following activities: development of treatment plan with patient and/or surrogate as well as nursing, discussions with consultants, evaluation of patient's response to treatment, examination of patient, obtaining history from patient or surrogate, ordering and performing treatments and interventions, ordering and review of laboratory studies, ordering and review of radiographic studies, pulse oximetry and re-evaluation of patient's condition.;  Medications Ordered in ED Medications   nitroGLYCERIN (NITROGLYN) 2 % ointment 0.5 inch (0.5 inches Topical Given 04/16/16 0054)  heparin ADULT infusion 100 units/mL (25000 units/23050mL sodium chloride 0.45%) (1,250 Units/hr Intravenous New Bag/Given 04/15/16 1935)  metoprolol succinate (TOPROL-XL) 24 hr tablet 25 mg (not administered)  tamsulosin (FLOMAX) capsule 0.4 mg (not administered)  atorvastatin (LIPITOR) tablet 80 mg (80 mg Oral Given 04/15/16 2259)  cholecalciferol (VITAMIN D) tablet 1,000 Units (1,000 Units Oral Given 04/15/16 2259)  aspirin EC tablet 81 mg (not administered)  nitroGLYCERIN (NITROSTAT) SL tablet 0.4 mg (not administered)  acetaminophen (TYLENOL) tablet 650 mg (not administered)  ondansetron (ZOFRAN) injection 4 mg (not administered)  ticagrelor (BRILINTA) tablet 90 mg (not administered)  irbesartan (AVAPRO) tablet 37.5 mg (37.5 mg Oral Given 04/15/16 2258)  aspirin chewable tablet 324 mg (243 mg Oral Given 04/15/16 1846)  ticagrelor (BRILINTA) tablet 180 mg (180 mg Oral Given 04/15/16 2259)     Initial Impression / Assessment and Plan / ED Course  I have reviewed the triage vital signs and the nursing notes.  Pertinent labs & imaging results that were available during my care of the patient were reviewed by me and considered in my medical decision making (see chart for details).  Clinical Course      NSTEMI. Pain resolved with nitro. ASA/Heparin given. D/w cardiology at [facility] and will transfer for admission to them.   Final Clinical Impressions(s) / ED Diagnoses   Final diagnoses:  NSTEMI (non-ST elevated myocardial infarction) [facility](HCC)     Marily MemosJason Kenneth Lax, MD 04/16/16 [phone]

## 2016-04-15 NOTE — ED Notes (Signed)
CRITICAL VALUE ALERT  Critical value received:  I-stat trop 2.09  Date of notification:  04/15/16  Time of notification:  1847  Critical value read back:yes  Nurse who received alert:  Rory Percy RN  MD notified (1st page):  Mesner  Time of first page:  1847  MD notified (2nd page):  Time of second page:  Responding MD:  Mesner   Time MD responded:  [phone]

## 2016-04-16 ENCOUNTER — Encounter (HOSPITAL_COMMUNITY): Payer: Self-pay

## 2016-04-16 ENCOUNTER — Encounter (HOSPITAL_COMMUNITY)
Admission: EM | Disposition: A | Discharge status: Home / Self Care | Payer: Self-pay | Source: Home / Self Care | Attending: Cardiovascular Disease

## 2016-04-16 DIAGNOSIS — I10 Essential (primary) hypertension: Secondary | ICD-10-CM

## 2016-04-16 DIAGNOSIS — I214 Non-ST elevation (NSTEMI) myocardial infarction: Principal | ICD-10-CM

## 2016-04-16 DIAGNOSIS — I255 Ischemic cardiomyopathy: Secondary | ICD-10-CM

## 2016-04-16 DIAGNOSIS — I5022 Chronic systolic (congestive) heart failure: Secondary | ICD-10-CM

## 2016-04-16 DIAGNOSIS — I25810 Atherosclerosis of coronary artery bypass graft(s) without angina pectoris: Secondary | ICD-10-CM

## 2016-04-16 DIAGNOSIS — E7800 Pure hypercholesterolemia, unspecified: Secondary | ICD-10-CM

## 2016-04-16 HISTORY — PX: CARDIAC CATHETERIZATION: SHX172

## 2016-04-16 LAB — HEPARIN LEVEL (UNFRACTIONATED)
HEPARIN UNFRACTIONATED: 0.42 [IU]/mL (ref 0.30–0.70)
Heparin Unfractionated: 0.27 IU/mL — ABNORMAL LOW (ref 0.30–0.70)

## 2016-04-16 LAB — CBC
HCT: 46.1 % (ref 39.0–52.0)
Hemoglobin: 16.1 g/dL (ref 13.0–17.0)
MCH: 32.1 pg (ref 26.0–34.0)
MCHC: 34.9 g/dL (ref 30.0–36.0)
MCV: 92 fL (ref 78.0–100.0)
PLATELETS: 158 10*3/uL (ref 150–400)
RBC: 5.01 MIL/uL (ref 4.22–5.81)
RDW: 12.2 % (ref 11.5–15.5)
WBC: 5.6 10*3/uL (ref 4.0–10.5)

## 2016-04-16 LAB — LIPID PANEL
Cholesterol: 157 mg/dL (ref 0–200)
HDL: 43 mg/dL (ref >40)
LDL Cholesterol: 95 mg/dL (ref 0–99)
TRIGLYCERIDES: 96 mg/dL (ref <150)
Total CHOL/HDL Ratio: 3.7 RATIO
VLDL: 19 mg/dL (ref 0–40)

## 2016-04-16 LAB — TROPONIN I
TROPONIN I: 3.27 ng/mL — AB (ref <0.03)
TROPONIN I: 2.75 ng/mL — AB (ref <0.03)
Troponin I: 2.99 ng/mL (ref <0.03)

## 2016-04-16 LAB — TSH: TSH: 4.942 u[IU]/mL — AB (ref 0.350–4.500)

## 2016-04-16 LAB — BASIC METABOLIC PANEL
Anion gap: 7 (ref 5–15)
BUN: 13 mg/dL (ref 6–20)
CALCIUM: 9.1 mg/dL (ref 8.9–10.3)
CO2: 26 mmol/L (ref 22–32)
Chloride: 106 mmol/L (ref 101–111)
Creatinine, Ser: 1.17 mg/dL (ref 0.61–1.24)
GFR calc Af Amer: >60 mL/min (ref >60)
GLUCOSE: 91 mg/dL (ref 65–99)
Potassium: 3.9 mmol/L (ref 3.5–5.1)
Sodium: 139 mmol/L (ref 135–145)

## 2016-04-16 LAB — BRAIN NATRIURETIC PEPTIDE: B NATRIURETIC PEPTIDE 5: 175.7 pg/mL — AB (ref 0.0–100.0)

## 2016-04-16 LAB — MRSA PCR SCREENING: MRSA by PCR: NEGATIVE

## 2016-04-16 SURGERY — LEFT HEART CATH AND CORS/GRAFTS ANGIOGRAPHY
Anesthesia: LOCAL

## 2016-04-16 MED ORDER — FENTANYL CITRATE (PF) 100 MCG/2ML IJ SOLN
INTRAMUSCULAR | Status: AC
  Filled 2016-04-16: qty 2

## 2016-04-16 MED ORDER — SODIUM CHLORIDE 0.9 % WEIGHT BASED INFUSION
3.0000 mL/kg/h | INTRAVENOUS | Status: DC
  Administered 2016-04-16: 3 mL/kg/h via INTRAVENOUS

## 2016-04-16 MED ORDER — LIDOCAINE HCL (PF) 1 % IJ SOLN
INTRAMUSCULAR | Status: AC
  Filled 2016-04-16: qty 30

## 2016-04-16 MED ORDER — NITROGLYCERIN 1 MG/10 ML FOR IR/CATH LAB
INTRA_ARTERIAL | Status: AC
  Filled 2016-04-16: qty 10

## 2016-04-16 MED ORDER — LABETALOL HCL 5 MG/ML IV SOLN: 10.0000 mg | INTRAVENOUS | Status: AC | PRN

## 2016-04-16 MED ORDER — SODIUM CHLORIDE 0.9% FLUSH
3.0000 mL | Freq: Two times a day (BID) | INTRAVENOUS | Status: DC
  Administered 2016-04-16 – 2016-04-18 (×5): 3 mL via INTRAVENOUS

## 2016-04-16 MED ORDER — MIDAZOLAM HCL 2 MG/2ML IJ SOLN
INTRAMUSCULAR | Status: AC
  Filled 2016-04-16: qty 2

## 2016-04-16 MED ORDER — SODIUM CHLORIDE 0.9% FLUSH
3.0000 mL | Freq: Two times a day (BID) | INTRAVENOUS | Status: DC
  Administered 2016-04-17 – 2016-04-18 (×2): 3 mL via INTRAVENOUS

## 2016-04-16 MED ORDER — IOPAMIDOL (ISOVUE-370) INJECTION 76%
INTRAVENOUS | Status: AC
  Filled 2016-04-16: qty 125

## 2016-04-16 MED ORDER — ADENOSINE 6 MG/2ML IV SOLN
INTRAVENOUS | Status: AC
  Filled 2016-04-16: qty 2

## 2016-04-16 MED ORDER — BIVALIRUDIN BOLUS VIA INFUSION - CUPID
INTRAVENOUS | Status: DC | PRN
  Administered 2016-04-16: 81.075 mg via INTRAVENOUS

## 2016-04-16 MED ORDER — HEPARIN (PORCINE) IN NACL 2-0.9 UNIT/ML-% IJ SOLN
INTRAMUSCULAR | Status: DC | PRN
  Administered 2016-04-16: 1000 mL via INTRA_ARTERIAL

## 2016-04-16 MED ORDER — SODIUM CHLORIDE 0.9% FLUSH
3.0000 mL | Freq: Two times a day (BID) | INTRAVENOUS | Status: DC
  Administered 2016-04-16: 3 mL via INTRAVENOUS

## 2016-04-16 MED ORDER — ADENOSINE (DIAGNOSTIC) FOR INTRACORONARY USE
INTRAVENOUS | Status: DC | PRN
  Administered 2016-04-16: 30 ug via INTRACORONARY

## 2016-04-16 MED ORDER — HEPARIN (PORCINE) IN NACL 2-0.9 UNIT/ML-% IJ SOLN
INTRAMUSCULAR | Status: AC
  Filled 2016-04-16: qty 1000

## 2016-04-16 MED ORDER — LIDOCAINE HCL (PF) 1 % IJ SOLN
INTRAMUSCULAR | Status: DC | PRN
  Administered 2016-04-16: 18 mL via INTRADERMAL

## 2016-04-16 MED ORDER — SODIUM CHLORIDE 0.9% FLUSH: 3.0000 mL | INTRAVENOUS | Status: DC | PRN

## 2016-04-16 MED ORDER — HEPARIN SODIUM (PORCINE) 5000 UNIT/ML IJ SOLN
5000.0000 [IU] | Freq: Three times a day (TID) | INTRAMUSCULAR | Status: DC
  Administered 2016-04-17 – 2016-04-18 (×4): 5000 [IU] via SUBCUTANEOUS
  Filled 2016-04-16 (×4): qty 1

## 2016-04-16 MED ORDER — IOPAMIDOL (ISOVUE-370) INJECTION 76%
INTRAVENOUS | Status: AC
  Filled 2016-04-16: qty 50

## 2016-04-16 MED ORDER — SODIUM CHLORIDE 0.9 % IV SOLN: 250.0000 mL | INTRAVENOUS | Status: DC | PRN

## 2016-04-16 MED ORDER — SODIUM CHLORIDE 0.9 % IV SOLN
INTRAVENOUS | Status: DC | PRN
  Administered 2016-04-16: 1.75 mg/kg/h via INTRAVENOUS

## 2016-04-16 MED ORDER — MIDAZOLAM HCL 2 MG/2ML IJ SOLN
INTRAMUSCULAR | Status: DC | PRN
  Administered 2016-04-16: 1 mg via INTRAVENOUS

## 2016-04-16 MED ORDER — FUROSEMIDE 10 MG/ML IJ SOLN
20.0000 mg | Freq: Once | INTRAMUSCULAR | Status: AC
  Administered 2016-04-16: 20 mg via INTRAVENOUS
  Filled 2016-04-16: qty 2

## 2016-04-16 MED ORDER — HYDRALAZINE HCL 20 MG/ML IJ SOLN: 5.0000 mg | INTRAMUSCULAR | Status: AC | PRN

## 2016-04-16 MED ORDER — IOPAMIDOL (ISOVUE-370) INJECTION 76%
INTRAVENOUS | Status: DC | PRN
  Administered 2016-04-16: 165 mL via INTRA_ARTERIAL

## 2016-04-16 MED ORDER — SODIUM CHLORIDE 0.9 % WEIGHT BASED INFUSION: 1.0000 mL/kg/h | INTRAVENOUS | Status: DC

## 2016-04-16 MED ORDER — BIVALIRUDIN 250 MG IV SOLR
INTRAVENOUS | Status: AC
  Filled 2016-04-16: qty 250

## 2016-04-16 MED ORDER — FENTANYL CITRATE (PF) 100 MCG/2ML IJ SOLN
INTRAMUSCULAR | Status: DC | PRN
  Administered 2016-04-16: 25 ug via INTRAVENOUS

## 2016-04-16 SURGICAL SUPPLY — 19 items
BALLN ~~LOC~~ EUPHORA RX 4.0X15 (BALLOONS) ×2
BALLOON ~~LOC~~ EUPHORA RX 4.0X15 (BALLOONS) ×1 IMPLANT
CATH EXPO 5F IM (CATHETERS) ×2 IMPLANT
CATH EXPO 5F MPA-1 (CATHETERS) ×2 IMPLANT
CATH INFINITI 5FR MULTPACK ANG (CATHETERS) ×2 IMPLANT
CATH VISTA GUIDE 6FR RCB 90 CM (CATHETERS) ×2 IMPLANT
DEVICE WIRE ANGIOSEAL 6FR (Vascular Products) ×2 IMPLANT
KIT ENCORE 26 ADVANTAGE (KITS) ×2 IMPLANT
KIT HEART LEFT (KITS) ×2 IMPLANT
PACK CARDIAC CATHETERIZATION (CUSTOM PROCEDURE TRAY) ×2 IMPLANT
SET INTRODUCER MICROPUNCT 5F (INTRODUCER) ×2 IMPLANT
SHEATH PINNACLE 5F 10CM (SHEATH) ×2 IMPLANT
SHEATH PINNACLE 6F 10CM (SHEATH) ×2 IMPLANT
STENT PROMUS PREM MR 3.5X28 (Permanent Stent) ×2 IMPLANT
TRANSDUCER W/STOPCOCK (MISCELLANEOUS) ×2 IMPLANT
TUBING CIL FLEX 10 FLL-RA (TUBING) ×2 IMPLANT
WIRE ASAHI GRAND SLAM 180CM (WIRE) ×2 IMPLANT
WIRE EMERALD 3MM-J .035X150CM (WIRE) ×2 IMPLANT
WIRE RUNTHROUGH .014X180CM (WIRE) ×2 IMPLANT

## 2016-04-16 NOTE — Progress Notes (Signed)
Patient Name: Stephen Macdonald Date of Encounter: 04/16/2016  Primary Cardiologist: [facility] Problem List     Active Problems:   Hyperlipidemia   NSTEMI (non-ST elevated myocardial infarction) (HCC)   HTN (hypertension)   Chronic systolic heart failure (HCC)   CKD (chronic kidney disease), stage III   BPH (benign prostatic hyperplasia)     Subjective   Currently chest pain-free with non-ST elevation myocardial infarction. No significant shortness of breath. Recently did have bout of chest pressure with left arm radiation that was concerning to him. This was different from his prior STEMI pain which was more epigastric discomfort he states.  Inpatient Medications    Scheduled Meds: . aspirin EC  81 mg Oral Daily  . atorvastatin  80 mg Oral QHS  . cholecalciferol  1,000 Units Oral QHS  . irbesartan  37.5 mg Oral Daily  . metoprolol succinate  25 mg Oral q morning - 10a  . nitroGLYCERIN  0.5 inch Topical Q6H  . tamsulosin  0.4 mg Oral q morning - 10a  . ticagrelor  90 mg Oral BID   Continuous Infusions: . heparin 1,350 Units/hr (04/16/16 0600)   PRN Meds: acetaminophen, nitroGLYCERIN, ondansetron (ZOFRAN) IV   Vital Signs    Vitals:   04/16/16 0800 04/16/16 0900 04/16/16 1000 04/16/16 1100  BP: 120/81 (!) 144/88 (!) 131/91 (!) 142/82  Pulse: 68 67 70 62  Resp: 16 (!) 26 17 14   Temp:      TempSrc:      SpO2: 94% 91% 94% 96%  Weight:      Height:        Intake/Output Summary (Last 24 hours) at 04/16/16 1145 Last data filed at 04/16/16 0900  Gross per 24 hour  Intake           170.71 ml  Output             2125 ml  Net         -1954.29 ml   Filed Weights   04/15/16 1821 04/15/16 2100  Weight: 240 lb (108.9 kg) 238 lb 5.1 oz (108.1 kg)    Physical Exam    GEN: Well nourished, well developed, in no acute distress.  HEENT: Grossly normal.  Neck: Supple, no JVD, carotid bruits, or masses. Cardiac: RRR, no murmurs, rubs, or gallops. No  clubbing, cyanosis, edema.  Radials/DP/PT 2+ and equal bilaterally. CABG scar. Left radial long incisional scar or bypass. ICD. Respiratory:  Respirations regular and unlabored, clear to auscultation bilaterally. GI: Soft, nontender, nondistended, BS + x 4. Overweight MS: no deformity or atrophy. Skin: warm and dry, no rash. Neuro:  Strength and sensation are intact. Psych: AAOx3.  Normal affect.  Labs    CBC  Recent Labs  04/15/16 1821  WBC 7.1  HGB 15.2  HCT 44.6  MCV 94.5  PLT 175   Basic Metabolic Panel  Recent Labs  04/15/16 1821 04/16/16 0350  NA 133* 139  K 3.5 3.9  CL 101 106  CO2 27 26  GLUCOSE 124* 91  BUN 18 13  CREATININE 1.33* 1.17  CALCIUM 8.5* 9.1   Liver Function Tests  Recent Labs  04/15/16 1821  AST 44*  ALT 33  ALKPHOS 73  BILITOT 1.1  PROT 6.7  ALBUMIN 3.8   No results for input(s): LIPASE, AMYLASE in the last 72 hours. Cardiac Enzymes  Recent Labs  04/15/16 2143 04/16/16 0350 04/16/16 0859  TROPONINI 2.99* 3.27* 2.75*   BNP  Invalid input(s): POCBNP D-Dimer No results for input(s): DDIMER in the last 72 hours. Hemoglobin A1C No results for input(s): HGBA1C in the last 72 hours. Fasting Lipid Panel  Recent Labs  04/16/16 0350  CHOL 157  HDL 43  LDLCALC 95  TRIG 96  CHOLHDL 3.7   Thyroid Function Tests  Recent Labs  04/16/16 0350  TSH 4.942*    Telemetry    No adverse arrhythmias - Personally Reviewed  ECG    T-wave inversion in 3, F, V5, V6 - subtle change from prior EKG 02/13/15 Personally Reviewed  Radiology    Dg Chest Portable 1 View  Result Date: 04/15/2016 CLINICAL DATA:  Patient states right upper chest pain that went across his chest into his left arm today. Hx of CHF, coronary artery disease, hypertension, pacemaker insertion, coronary stent placement, CABG x 3, cardiac cath. Current smoker. EXAM: PORTABLE CHEST 1 VIEW COMPARISON:  11/10/2007 FINDINGS: Left-sided transvenous pacemaker leads to  the right atrium, right ventricle, and coronary sinus. Status post median sternotomy and CABG. The heart is mildly enlarged. There are no focal consolidations or pleural effusions. No pulmonary edema. IMPRESSION: No active disease. Electronically Signed   By: Norva Pavlov M.D.   On: 04/15/2016 19:40    Cardiac Studies   Cardiac cath 02/12/15:  There is severe left ventricular systolic dysfunction.  Severe native three vessel CAD.  Mid SVG to RCA Graft lesion, 100% stenosed which is the culprit vessel. Post intervention with overlapping 3.5 mm Synergy stents with distal embolic protection, there is a 0% residual stenosis.  Patent LIMA to LAD, LIMA to OM Y-graft.  Mid Graft lesion, 100% stenosed. Post intervention, there is a 0% residual stenosis.   Continue aggressive medical therapy.  There is residual severe disease in the diagonal which is not bypassed.  It appears there is a Y graft from the LIMA that supply the circumflex and the LAD.   Signed by Corky Crafts, MD on 02/12/2015 22:46   Echocardiogram 02/16/15 - Left ventricle: The cavity size was severely dilated. Wall   thickness was increased in a pattern of moderate LVH. Systolic   function was severely reduced. The estimated ejection fraction   was in the range of 25% to 30%. Akinesis of the inferior and   apical myocardium. - Mitral valve: There was mild regurgitation. - Left atrium: The atrium was moderately dilated. - Right atrium: The atrium was mildly dilated.  Patient Profile     70 year old male with coronary disease status post CABG 1997, ICD, vein graft intervention SVG to RCA approximate 14 months ago for STEMI presenting with chest pain, non-STEMI currently chest pain-free.  Assessment & Plan    Non-ST elevation myocardial infarction  - Troponin increased to 3.27 now down to 2.75  - Chest pain-free currently but previously was experiencing chest pain/heaviness with radiation down his left arm  which was different from his prior STEMI last October which was more epigastric discomfort.  - Heparin IV  - Aspirin, Brilinta metoprolol, ARB  - Given his current situation, I discussed his case with Dr. Thayer Ohm End of the interventional team and we will activate the cardiac catheterization lab and perform angiogram with potential percutaneous intervention. Risks and benefits of the procedure including stroke, heart attack, death, renal impairment have been discussed. He is willing to proceed.  Ischemic cardiomyopathy/chronic systolic heart failure  - Currently appears comfortable without any increased work of breathing.  - Continue current medications.  Critical care time 35 minutes  spent with patient, review of extensive data, discussion with interventional care team/nursing coordinating care in this gentleman with active myocardial infarction life-threatening.  Signed, Donato SchultzMark Piper Hassebrock, MD  04/16/2016, 11:45 AM

## 2016-04-16 NOTE — Interval H&P Note (Signed)
History and Physical Interval Note:  04/16/2016 12:40 PM  Stephen Macdonald  has presented today for cardiac catheterization, with the diagnosis of NSTEMI. The various methods of treatment have been discussed with the patient and family. After consideration of risks, benefits and other options for treatment, the patient has consented to  Procedure(s): Left Heart Cath and Coronary Angiography (N/A) as a surgical intervention .  The patient's history has been reviewed, patient examined, no change in status, stable for surgery.  I have reviewed the patient's chart and labs.  Questions were answered to the patient's satisfaction.    Cath Lab Visit (complete for each Cath Lab visit)  Clinical Evaluation Leading to the Procedure:   ACS: Yes.   (NSTEMI)  Non-ACS:  N/A  [name]

## 2016-04-16 NOTE — Progress Notes (Signed)
ANTICOAGULATION CONSULT NOTE - Follow Up Consult  Pharmacy Consult for heparin Indication: NSTEMI  Labs:  Recent Labs  04/15/16 1821 04/15/16 2143 04/16/16 0350  HGB 15.2  --   --   HCT 44.6  --   --   PLT 175  --   --   HEPARINUNFRC  --   --  0.27*  CREATININE 1.33*  --   --   TROPONINI 2.70* 2.99*  --     Assessment: 70yo male slightly subtherapeutic on heparin with initial dosing for NSTEMI.  Goal of Therapy:  Heparin level 0.3-0.7 units/ml   Plan:  Will increase heparin gtt by 1 unit/kg/hr to 1350 units/hr and check level w/ next troponin.  Vernard Gambles, PharmD, BCPS  04/16/2016,5:46 AM

## 2016-04-16 NOTE — H&P (View-Only) (Signed)
Patient Name: Stephen Macdonald Date of Encounter: 04/16/2016  Primary Cardiologist: [facility] Problem List     Active Problems:   Hyperlipidemia   NSTEMI (non-ST elevated myocardial infarction) (HCC)   HTN (hypertension)   Chronic systolic heart failure (HCC)   CKD (chronic kidney disease), stage III   BPH (benign prostatic hyperplasia)     Subjective   Currently chest pain-free with non-ST elevation myocardial infarction. No significant shortness of breath. Recently did have bout of chest pressure with left arm radiation that was concerning to him. This was different from his prior STEMI pain which was more epigastric discomfort he states.  Inpatient Medications    Scheduled Meds: . aspirin EC  81 mg Oral Daily  . atorvastatin  80 mg Oral QHS  . cholecalciferol  1,000 Units Oral QHS  . irbesartan  37.5 mg Oral Daily  . metoprolol succinate  25 mg Oral q morning - 10a  . nitroGLYCERIN  0.5 inch Topical Q6H  . tamsulosin  0.4 mg Oral q morning - 10a  . ticagrelor  90 mg Oral BID   Continuous Infusions: . heparin 1,350 Units/hr (04/16/16 0600)   PRN Meds: acetaminophen, nitroGLYCERIN, ondansetron (ZOFRAN) IV   Vital Signs    Vitals:   04/16/16 0800 04/16/16 0900 04/16/16 1000 04/16/16 1100  BP: 120/81 (!) 144/88 (!) 131/91 (!) 142/82  Pulse: 68 67 70 62  Resp: 16 (!) 26 17 14   Temp:      TempSrc:      SpO2: 94% 91% 94% 96%  Weight:      Height:        Intake/Output Summary (Last 24 hours) at 04/16/16 1145 Last data filed at 04/16/16 0900  Gross per 24 hour  Intake           170.71 ml  Output             2125 ml  Net         -1954.29 ml   Filed Weights   04/15/16 1821 04/15/16 2100  Weight: 240 lb (108.9 kg) 238 lb 5.1 oz (108.1 kg)    Physical Exam    GEN: Well nourished, well developed, in no acute distress.  HEENT: Grossly normal.  Neck: Supple, no JVD, carotid bruits, or masses. Cardiac: RRR, no murmurs, rubs, or gallops. No  clubbing, cyanosis, edema.  Radials/DP/PT 2+ and equal bilaterally. CABG scar. Left radial long incisional scar or bypass. ICD. Respiratory:  Respirations regular and unlabored, clear to auscultation bilaterally. GI: Soft, nontender, nondistended, BS + x 4. Overweight MS: no deformity or atrophy. Skin: warm and dry, no rash. Neuro:  Strength and sensation are intact. Psych: AAOx3.  Normal affect.  Labs    CBC  Recent Labs  04/15/16 1821  WBC 7.1  HGB 15.2  HCT 44.6  MCV 94.5  PLT 175   Basic Metabolic Panel  Recent Labs  04/15/16 1821 04/16/16 0350  NA 133* 139  K 3.5 3.9  CL 101 106  CO2 27 26  GLUCOSE 124* 91  BUN 18 13  CREATININE 1.33* 1.17  CALCIUM 8.5* 9.1   Liver Function Tests  Recent Labs  04/15/16 1821  AST 44*  ALT 33  ALKPHOS 73  BILITOT 1.1  PROT 6.7  ALBUMIN 3.8   No results for input(s): LIPASE, AMYLASE in the last 72 hours. Cardiac Enzymes  Recent Labs  04/15/16 2143 04/16/16 0350 04/16/16 0859  TROPONINI 2.99* 3.27* 2.75*   BNP  Invalid input(s): POCBNP D-Dimer No results for input(s): DDIMER in the last 72 hours. Hemoglobin A1C No results for input(s): HGBA1C in the last 72 hours. Fasting Lipid Panel  Recent Labs  04/16/16 0350  CHOL 157  HDL 43  LDLCALC 95  TRIG 96  CHOLHDL 3.7   Thyroid Function Tests  Recent Labs  04/16/16 0350  TSH 4.942*    Telemetry    No adverse arrhythmias - Personally Reviewed  ECG    T-wave inversion in 3, F, V5, V6 - subtle change from prior EKG 02/13/15 Personally Reviewed  Radiology    Dg Chest Portable 1 View  Result Date: 04/15/2016 CLINICAL DATA:  Patient states right upper chest pain that went across his chest into his left arm today. Hx of CHF, coronary artery disease, hypertension, pacemaker insertion, coronary stent placement, CABG x 3, cardiac cath. Current smoker. EXAM: PORTABLE CHEST 1 VIEW COMPARISON:  11/10/2007 FINDINGS: Left-sided transvenous pacemaker leads to  the right atrium, right ventricle, and coronary sinus. Status post median sternotomy and CABG. The heart is mildly enlarged. There are no focal consolidations or pleural effusions. No pulmonary edema. IMPRESSION: No active disease. Electronically Signed   By: Norva Pavlov M.D.   On: 04/15/2016 19:40    Cardiac Studies   Cardiac cath 02/12/15:  There is severe left ventricular systolic dysfunction.  Severe native three vessel CAD.  Mid SVG to RCA Graft lesion, 100% stenosed which is the culprit vessel. Post intervention with overlapping 3.5 mm Synergy stents with distal embolic protection, there is a 0% residual stenosis.  Patent LIMA to LAD, LIMA to OM Y-graft.  Mid Graft lesion, 100% stenosed. Post intervention, there is a 0% residual stenosis.   Continue aggressive medical therapy.  There is residual severe disease in the diagonal which is not bypassed.  It appears there is a Y graft from the LIMA that supply the circumflex and the LAD.   Signed by Corky Crafts, MD on 02/12/2015 22:46   Echocardiogram 02/16/15 - Left ventricle: The cavity size was severely dilated. Wall   thickness was increased in a pattern of moderate LVH. Systolic   function was severely reduced. The estimated ejection fraction   was in the range of 25% to 30%. Akinesis of the inferior and   apical myocardium. - Mitral valve: There was mild regurgitation. - Left atrium: The atrium was moderately dilated. - Right atrium: The atrium was mildly dilated.  Patient Profile     70 year old male with coronary disease status post CABG 1997, ICD, vein graft intervention SVG to RCA approximate 14 months ago for STEMI presenting with chest pain, non-STEMI currently chest pain-free.  Assessment & Plan    Non-ST elevation myocardial infarction  - Troponin increased to 3.27 now down to 2.75  - Chest pain-free currently but previously was experiencing chest pain/heaviness with radiation down his left arm  which was different from his prior STEMI last October which was more epigastric discomfort.  - Heparin IV  - Aspirin, Brilinta metoprolol, ARB  - Given his current situation, I discussed his case with Dr. Thayer Ohm End of the interventional team and we will activate the cardiac catheterization lab and perform angiogram with potential percutaneous intervention. Risks and benefits of the procedure including stroke, heart attack, death, renal impairment have been discussed. He is willing to proceed.  Ischemic cardiomyopathy/chronic systolic heart failure  - Currently appears comfortable without any increased work of breathing.  - Continue current medications.  Critical care time 35 minutes  spent with patient, review of extensive data, discussion with interventional care team/nursing coordinating care in this gentleman with active myocardial infarction life-threatening.  Signed, Donato SchultzMark Terril Amaro, MD  04/16/2016, 11:45 AM

## 2016-04-16 NOTE — Progress Notes (Signed)
ANTICOAGULATION CONSULT NOTE - Follow Up Consult  Pharmacy Consult for heparin Indication: NSTEMI  Allergies  Allergen Reactions  . Codeine Other (See Comments)    Tingling skin. "makes my skin crawl"    Patient Measurements: Height: 6\' 1"  (185.4 cm) Weight: 238 lb 5.1 oz (108.1 kg) IBW/kg (Calculated) : 79.9 Heparin Dosing Weight: 102 kg  Vital Signs: Temp: 97.4 F (36.3 C) (12/30 0732) Temp Source: Oral (12/30 0732) BP: 144/88 (12/30 0900) Pulse Rate: 67 (12/30 0900)  Labs:  Recent Labs  04/15/16 1821 04/15/16 2143 04/16/16 0350 04/16/16 0859  HGB 15.2  --   --   --   HCT 44.6  --   --   --   PLT 175  --   --   --   HEPARINUNFRC  --   --  0.27* 0.42  CREATININE 1.33*  --  1.17  --   TROPONINI 2.70* 2.99* 3.27* 2.75*    Estimated Creatinine Clearance: 74.7 mL/min (by C-G formula based on SCr of 1.17 mg/dL).  Assessment:  70 y/o M with NSTEMI. Pharmacy consulted to dose heparin. Heparin level therapeutic. CBC stable.   Goal of Therapy:  Heparin level 0.3-0.7 units/ml Monitor platelets by anticoagulation protocol: Yes   Plan:  Continue heparin IV at 1350 units/hr Confirmatory 6 hr heparin level Daily heparin level, CBC Monitor for S&S of bleed  Sandi Carne, PharmD, BCPS Pharmacy Resident Pager: [phone] 04/16/2016,10:26 AM

## 2016-04-17 ENCOUNTER — Inpatient Hospital Stay (HOSPITAL_COMMUNITY): Payer: Medicare Other

## 2016-04-17 DIAGNOSIS — I219 Acute myocardial infarction, unspecified: Secondary | ICD-10-CM

## 2016-04-17 LAB — BASIC METABOLIC PANEL
ANION GAP: 7 (ref 5–15)
BUN: 14 mg/dL (ref 6–20)
CALCIUM: 8.8 mg/dL — AB (ref 8.9–10.3)
CO2: 25 mmol/L (ref 22–32)
CREATININE: 1.21 mg/dL (ref 0.61–1.24)
Chloride: 106 mmol/L (ref 101–111)
GFR calc Af Amer: >60 mL/min (ref >60)
GFR, EST NON AFRICAN AMERICAN: 58 mL/min — AB (ref >60)
GLUCOSE: 102 mg/dL — AB (ref 65–99)
Potassium: 3.6 mmol/L (ref 3.5–5.1)
Sodium: 138 mmol/L (ref 135–145)

## 2016-04-17 LAB — HEMOGLOBIN A1C
Hgb A1c MFr Bld: 5.6 % (ref 4.8–5.6)
MEAN PLASMA GLUCOSE: 114 mg/dL

## 2016-04-17 LAB — ECHOCARDIOGRAM COMPLETE
Height: 73 in
WEIGHTICAEL: 3813.08 [oz_av]

## 2016-04-17 LAB — CBC
HCT: 44.7 % (ref 39.0–52.0)
Hemoglobin: 16 g/dL (ref 13.0–17.0)
MCH: 32.5 pg (ref 26.0–34.0)
MCHC: 35.8 g/dL (ref 30.0–36.0)
MCV: 90.7 fL (ref 78.0–100.0)
PLATELETS: 174 10*3/uL (ref 150–400)
RBC: 4.93 MIL/uL (ref 4.22–5.81)
RDW: 12.1 % (ref 11.5–15.5)
WBC: 7.5 10*3/uL (ref 4.0–10.5)

## 2016-04-17 MED ORDER — PERFLUTREN LIPID MICROSPHERE
1.0000 mL | INTRAVENOUS | Status: AC | PRN
  Administered 2016-04-17: 2 mL via INTRAVENOUS
  Filled 2016-04-17: qty 10

## 2016-04-17 MED ORDER — PERFLUTREN LIPID MICROSPHERE
INTRAVENOUS | Status: AC
  Filled 2016-04-17: qty 10

## 2016-04-17 MED ORDER — FUROSEMIDE 10 MG/ML IJ SOLN
40.0000 mg | Freq: Once | INTRAMUSCULAR | Status: AC
  Administered 2016-04-17: 40 mg via INTRAVENOUS
  Filled 2016-04-17: qty 4

## 2016-04-17 NOTE — Progress Notes (Signed)
Patient arrived to 2W room 30 in no apparent distress.  Patient was oriented to unit and room to include phone and call light.  Telemetry monitor was applied and CCMD notified.  Will continue to monitor.

## 2016-04-17 NOTE — Progress Notes (Signed)
  Echocardiogram 2D Echocardiogram with Definity has been performed.  Stephen Macdonald 04/17/2016, 1:34 PM

## 2016-04-17 NOTE — Progress Notes (Signed)
Subjective: Pateint comfortable  No CP  No SOB at rest   Objective: Vitals:   04/17/16 0000 04/17/16 0100 04/17/16 0521 04/17/16 0834  BP: 109/71 (!) 97/58 (!) 85/62 120/74  Pulse: 60 64 67 68  Resp: 13 16 17    Temp:   97.4 F (36.3 C)   TempSrc:   Oral   SpO2: 95% 90% 92%   Weight:      Height:       Weight change:   Intake/Output Summary (Last 24 hours) at 04/17/16 1312 Last data filed at 04/17/16 0600  Gross per 24 hour  Intake                0 ml  Output             1800 ml  Net            -1800 ml    ? Complete I/O    General: Alert, awake, oriented x3, in no acute distress Neck:  JVP is normal Heart: Regular rate and rhythm, without murmurs, rubs, gallops.  Lungs: Clear to auscultation.  No rales or wheezes. Exemities:  No edema.   Neuro: Grossly intact, nonfocal.   Lab Results: Results for orders placed or performed during the hospital encounter of 04/15/16 (from the past 24 hour(s))  CBC     Status: None   Collection Time: 04/16/16  4:25 PM  Result Value Ref Range   WBC 5.6 4.0 - 10.5 K/uL   RBC 5.01 4.22 - 5.81 MIL/uL   Hemoglobin 16.1 13.0 - 17.0 g/dL   HCT 16.146.1 09.639.0 - 04.552.0 %   MCV 92.0 78.0 - 100.0 fL   MCH 32.1 26.0 - 34.0 pg   MCHC 34.9 30.0 - 36.0 g/dL   RDW 40.912.2 81.111.5 - 91.415.5 %   Platelets 158 150 - 400 K/uL  CBC     Status: None   Collection Time: 04/17/16  2:57 AM  Result Value Ref Range   WBC 7.5 4.0 - 10.5 K/uL   RBC 4.93 4.22 - 5.81 MIL/uL   Hemoglobin 16.0 13.0 - 17.0 g/dL   HCT 78.244.7 95.639.0 - 21.352.0 %   MCV 90.7 78.0 - 100.0 fL   MCH 32.5 26.0 - 34.0 pg   MCHC 35.8 30.0 - 36.0 g/dL   RDW 08.612.1 57.811.5 - 46.915.5 %   Platelets 174 150 - 400 K/uL  Basic metabolic panel     Status: Abnormal   Collection Time: 04/17/16  2:57 AM  Result Value Ref Range   Sodium 138 135 - 145 mmol/L   Potassium 3.6 3.5 - 5.1 mmol/L   Chloride 106 101 - 111 mmol/L   CO2 25 22 - 32 mmol/L   Glucose, Bld 102 (H) 65 - 99 mg/dL   BUN 14 6 - 20 mg/dL   Creatinine,  Ser 6.291.21 0.61 - 1.24 mg/dL   Calcium 8.8 (L) 8.9 - 10.3 mg/dL   GFR calc non Af Amer 58 (L) >60 mL/min   GFR calc Af Amer >60 >60 mL/min   Anion gap 7 5 - 15    Studies/Results: No results found.  Medications:REviewed     @PROBHOSP @ 1  NSTEMI  Pt has severe 3 V CAD Patent LIMA t oLAD;  Free radial graft to OM coming from LIMA; Patent SVG to PDA with 90% stenosis in distal SVG; Patent stent in mid SVG to PDA Patient underwent PCI/DES (PRomus) to SVG to PDA  LVEDP noted to be  27.   REcomm:  Aggressive secondary prevention of 80% D1 and rPLSA lesions  Will give IV lasix x1today  Follow response  Echo pending  Just done  WIll review   2  HL  High dose lipitor     Plan to transfer to tele  Cardiac rehab to see  Note pt follows at [facility].    LOS: 2 days   Dietrich Pates 04/17/2016, 1:12 PM

## 2016-04-18 ENCOUNTER — Encounter (HOSPITAL_COMMUNITY): Payer: Self-pay | Admitting: Nurse Practitioner

## 2016-04-18 DIAGNOSIS — I2510 Atherosclerotic heart disease of native coronary artery without angina pectoris: Secondary | ICD-10-CM

## 2016-04-18 DIAGNOSIS — I255 Ischemic cardiomyopathy: Secondary | ICD-10-CM

## 2016-04-18 LAB — CBC
HEMATOCRIT: 45.4 % (ref 39.0–52.0)
Hemoglobin: 16.1 g/dL (ref 13.0–17.0)
MCH: 32.3 pg (ref 26.0–34.0)
MCHC: 35.5 g/dL (ref 30.0–36.0)
MCV: 91 fL (ref 78.0–100.0)
Platelets: 191 10*3/uL (ref 150–400)
RBC: 4.99 MIL/uL (ref 4.22–5.81)
RDW: 12.3 % (ref 11.5–15.5)
WBC: 7.9 10*3/uL (ref 4.0–10.5)

## 2016-04-18 LAB — BASIC METABOLIC PANEL
Anion gap: 11 (ref 5–15)
BUN: 14 mg/dL (ref 6–20)
CO2: 21 mmol/L — AB (ref 22–32)
CREATININE: 1.62 mg/dL — AB (ref 0.61–1.24)
Calcium: 9.3 mg/dL (ref 8.9–10.3)
Chloride: 104 mmol/L (ref 101–111)
GFR calc Af Amer: 48 mL/min — ABNORMAL LOW (ref >60)
GFR calc non Af Amer: 41 mL/min — ABNORMAL LOW (ref >60)
Glucose, Bld: 145 mg/dL — ABNORMAL HIGH (ref 65–99)
Potassium: 3.9 mmol/L (ref 3.5–5.1)
Sodium: 136 mmol/L (ref 135–145)

## 2016-04-18 MED ORDER — TICAGRELOR 90 MG PO TABS: 90.0000 mg | ORAL_TABLET | Freq: Two times a day (BID) | ORAL | 0 refills | Status: DC

## 2016-04-18 MED ORDER — POTASSIUM CHLORIDE ER 10 MEQ PO TBCR: 20.0000 meq | EXTENDED_RELEASE_TABLET | Freq: Every day | ORAL | 3 refills | Status: DC

## 2016-04-18 MED ORDER — POTASSIUM CHLORIDE ER 20 MEQ PO TBCR: 20.0000 meq | EXTENDED_RELEASE_TABLET | Freq: Every day | ORAL | 3 refills | Status: DC

## 2016-04-18 MED ORDER — FUROSEMIDE 40 MG PO TABS: 40.0000 mg | ORAL_TABLET | Freq: Every day | ORAL | 3 refills | Status: DC

## 2016-04-18 MED ORDER — TICAGRELOR 90 MG PO TABS: 90.0000 mg | ORAL_TABLET | Freq: Two times a day (BID) | ORAL | 6 refills | Status: DC

## 2016-04-18 NOTE — Progress Notes (Signed)
Pt ambulated with nurse in hallway. Tolerated well. No c/o. Denied SOB or CP during walk.

## 2016-04-18 NOTE — Progress Notes (Signed)
Luke, PA up on unit at this time, pager not working reason for no return on pages. Informed him of pt's labs resulting and need for discharge order.

## 2016-04-18 NOTE — Progress Notes (Signed)
Subjective: Patient feels well.  No chest pain, shortness of breath, or edema.  Objective: Vitals:   04/17/16 1700 04/17/16 1732 04/17/16 2050 04/18/16 0427  BP:  96/69 (!) 99/59 116/66  Pulse:  73 66 70  Resp:  18 18 18   Temp: 98.3 F (36.8 C) 98.1 F (36.7 C) 97.7 F (36.5 C) 97.7 F (36.5 C)  TempSrc: Oral Oral Oral Oral  SpO2:  100% 97% 95%  Weight:      Height:       Weight change:   Intake/Output Summary (Last 24 hours) at 04/18/16 1101 Last data filed at 04/18/16 0300  Gross per 24 hour  Intake                0 ml  Output              425 ml  Net             -425 ml    General: Alert, awake, oriented x3, in no acute distress Neck:  JVP ~8-10 cm with +HJR. Heart: Regular rate and rhythm, without murmurs, rubs, gallops.  Lungs: Clear to auscultation.  No rales or wheezes. Exemities:  No edema.  Right groin soft and non-tender with 2+ femoral pulse.  No hematoma.  2+ right pedal pulses. Neuro: Grossly intact, nonfocal.  Lab Results: Results for orders placed or performed during the hospital encounter of 04/15/16 (from the past 24 hour(s))  CBC     Status: None   Collection Time: 04/18/16  2:46 AM  Result Value Ref Range   WBC 7.9 4.0 - 10.5 K/uL   RBC 4.99 4.22 - 5.81 MIL/uL   Hemoglobin 16.1 13.0 - 17.0 g/dL   HCT 16.1 09.6 - 04.5 %   MCV 91.0 78.0 - 100.0 fL   MCH 32.3 26.0 - 34.0 pg   MCHC 35.5 30.0 - 36.0 g/dL   RDW 40.9 81.1 - 91.4 %   Platelets 191 150 - 400 K/uL    Studies/Results: TTE (04/17/16): - Procedure narrative: Transthoracic echocardiography. Image   quality was adequate. The study was technically difficult, as a   result of poor sound wave transmission. Intravenous contrast   (Definity) was administered. - Left ventricle: The cavity size was mildly to moderately dilated.   Wall thickness was normal. The estimated ejection fraction was   20%. Doppler parameters are consistent with abnormal left   ventricular relaxation (grade 1  diastolic dysfunction). - Regional wall motion abnormality: Akinesis of the mid-apical   anterior, mid anteroseptal, apical inferior, basal inferolateral,   mid anterolateral, and apical myocardium; severe hypokinesis of   the basal anteroseptal and mid inferior myocardium. - Left atrium: The atrium was mildly dilated. - Right ventricle: Pacer wire or catheter noted in right ventricle.   Systolic function was reduced. - Tricuspid valve: There was mild regurgitation.  Medications:REviewed     1  NSTEMI  Pt has severe 3 V CAD Patent LIMA to LAD;  Free radial graft to OM coming from LIMA; Patent SVG to PDA with 90% stenosis in distal SVG; Patent stent in mid SVG to PDA Patient underwent PCI/DES (PRomus) to SVG to PDA  LVEDP noted to be 27.    Continue DAPT with ASA and ticagrelor for at least 12 months, ideally indefinitely.  2.  Ischemic cardiomyopathy with severe systolic heart failure: Patient without edema and upper normal JVP.  Lungs clear.  Symptoms at baseline (NYHA class II).  Echo again shows severe LV  contractile dysfunction (EF ~20%) as well as some RV dysfunction.  Start Lasix 40 mg PO daily and KCl 20 mEq daily  Recheck BMP today  Continue current doses of metoprolol and irbesartan  Defer starting aldosterone antagonist to primary cardiologist [facility])  3.  Hyperlipidemia:   Continue high-intensity statin therapy  4.  Disposition:  If BMP normal and able to get supply of ticagrelor for patient, will plan discharge today and close follow-up at [facility].  Yvonne Kendall, MD [facility] Pager: [phone]

## 2016-04-18 NOTE — Progress Notes (Addendum)
Further delay in discharge due to unit printer broken resulting in inability to print d/c instructions. Several attempts made to print from other units with no success.  RN started reviewing instructions on the computer with pt and preparing him for discharge, charge nurse finally able to get instructions printed for pt. Copy of instructions, scripts for brilinta, brilinta pharm card, stent card and information on stent --all given to pt. Education provided, handouts given, CHF education also reviewed with pt and pt able to teach back, pt knows when to call MD. . Pt d/c'd with family ambulating (steady gait and walked several times on unit today independently), pt declined wheelchair, walked with family out, pt had all belongings at discharge.

## 2016-04-18 NOTE — Discharge Instructions (Signed)
**  PLEASE REMEMBER TO BRING ALL OF YOUR MEDICATIONS TO EACH OF YOUR FOLLOW-UP OFFICE VISITS.  NO HEAVY LIFTING X 2 WEEKS. NO SEXUAL ACTIVITY X 2 WEEKS. NO DRIVING X 1 WEEK. NO SOAKING BATHS, HOT TUBS, POOLS, ETC., X 7 DAYS.  Groin Site Care Refer to this sheet in the next few weeks. These instructions provide you with information on caring for yourself after your procedure. Your caregiver may also give you more specific instructions. Your treatment has been planned according to current medical practices, but problems sometimes occur. Call your caregiver if you have any problems or questions after your procedure. HOME CARE INSTRUCTIONS  You may shower 24 hours after the procedure. Remove the bandage (dressing) and gently wash the site with plain soap and water. Gently pat the site dry.   Do not apply powder or lotion to the site.   Do not sit in a bathtub, swimming pool, or whirlpool for 5 to 7 days.   No bending, squatting, or lifting anything over 10 pounds (4.5 kg) as directed by your caregiver.   Inspect the site at least twice daily.   Do not drive home if you are discharged the same day of the procedure. Have someone else drive you.   You may drive 24 hours after the procedure unless otherwise instructed by your caregiver.  What to expect:  Any bruising will usually fade within 1 to 2 weeks.   Blood that collects in the tissue (hematoma) may be painful to the touch. It should usually decrease in size and tenderness within 1 to 2 weeks.  SEEK IMMEDIATE MEDICAL CARE IF:  You have unusual pain at the groin site or down the affected leg.   You have redness, warmth, swelling, or pain at the groin site.   You have drainage (other than a small amount of blood on the dressing).   You have chills.   You have a fever or persistent symptoms for more than 72 hours.   You have a fever and your symptoms suddenly get worse.   Your leg becomes pale, cool, tingly, or numb.  You have  heavy bleeding from the site. Hold pressure on the site. .     10 Habits of Highly Healthy People  [facility] wants to help you get well and stay well.  Live a longer, healthier life by practicing healthy habits every day.  1.  Visit your primary care provider regularly. 2.  Make time for family and friends.  Healthy relationships are important. 3.  Take medications as directed by your provider. 4.  Maintain a healthy weight and a trim waistline. 5.  Eat healthy meals and snacks, rich in fruits, vegetables, whole grains, and lean proteins. 6.  Get moving every day - aim for 150 minutes of moderate physical activity each week. 7.  Don't smoke. 8.  Avoid alcohol or drink in moderation. 9.  Manage stress through meditation or mindful relaxation. 10.  Get seven to nine hours of quality sleep each night.  Want more information on healthy habits?  To learn more about these and other healthy habits, visit [email or web address]. _____________

## 2016-04-18 NOTE — Progress Notes (Signed)
Gardiner Coins, NP not here after 1300 today, Sherre Poot here now, message sent via amion texting system, and have paged him directly. Waiting response with d/c order and/or return call. Pt informed.

## 2016-04-18 NOTE — Discharge Summary (Signed)
Discharge Summary    Patient ID: Stephen Macdonald,  MRN: [id number], DOB/AGE: [birth year] 71 y.o.  Admit date: 04/15/2016 Discharge date: 04/18/2016  Primary Care Provider: DONDIEGO,RICHARD M Primary Cardiologist: [facility]  Discharge Diagnoses    Principal Problem:   NSTEMI (non-ST elevated myocardial infarction) [facility])  **S/p PCI/DES of the Graft  RPDA this admission. Active Problems:   CAD (coronary artery disease)   HTN (hypertension)   Chronic systolic heart failure (HCC)   CKD (chronic kidney disease), stage III   Cardiomyopathy, ischemic   Hyperlipidemia   BPH (benign prostatic hyperplasia)  Allergies Allergies  Allergen Reactions  . Codeine Other (See Comments)    Tingling skin. "makes my skin crawl"    Diagnostic Studies/Procedures    Cardiac Catheterization and Percutaneous Coronary Intervention 12.30.2017  Coronary Findings  Dominance: Right  Left Main  LM lesion, 20% stenosed. Distal tapering of LMCA  Left Anterior Descending  Mid LAD lesion, 100% stenosed. The lesion is type C and located at the major branch.  Second Diagonal [employer] 2nd Diag to 2nd Diag lesion, 80% stenosed.  Ramus Intermedius  Vessel is small.  Ramus lesion, 60% stenosed.  Left Circumflex  First Obtuse Marginal Branch  1st Mrg lesion, 90% stenosed.  Right Coronary Artery  Dist RCA filled by collaterals from Mid RCA.  Prox RCA lesion, 60% stenosed.  Mid RCA lesion, 100% stenosed.  Right Posterior Atrioventricular Branch  Post Atrio lesion, 80% stenosed. The lesion is not significantly changed from prior catheterization in 01/2015.  Graft Angiography  Free Graft to RPDA  and is large.  Mid Graft lesion, 15% stenosed. The lesion was previously treated using a drug eluting stent between 1-2 years ago. Previously placed stent displays restenosis. 10-20% focal in-stent restenosis.  Dist Graft lesion, 90% stenosed. Culprit lesion. The lesion is type C and eccentric. The  lesion is calcified. Lesion lies at a sharp bend in the distal SVG and is only well-seen in the LAO caudal projection.  Angioplasty: The distal Graft  RPDA was successfully stented using a 3.5 x 28 mm Promus Premier DES.   There is a 30% residual stenosis post intervention.  LIMA Graft to 1st Mrg, Dist LAD  and is normal in caliber.  _____________  2D Echocardiogram 12.31.2017  Study Conclusions   - Procedure narrative: Transthoracic echocardiography. Image   quality was adequate. The study was technically difficult, as a   result of poor sound wave transmission. Intravenous contrast   (Definity) was administered. - Left ventricle: The cavity size was mildly to moderately dilated.   Wall thickness was normal. The estimated ejection fraction was   20%. Doppler parameters are consistent with abnormal left   ventricular relaxation (grade 1 diastolic dysfunction). - Regional wall motion abnormality: Akinesis of the mid-apical   anterior, mid anteroseptal, apical inferior, basal inferolateral,   mid anterolateral, and apical myocardium; severe hypokinesis of   the basal anteroseptal and mid inferior myocardium. - Left atrium: The atrium was mildly dilated. - Right ventricle: Pacer wire or catheter noted in right ventricle.   Systolic function was reduced. - Tricuspid valve: There was mild regurgitation. _____________   History of Present Illness     71 y/o ? with a h/o CAD s/p CABG in 1997, ICM s/p AICD, HTN, HL, CKD III, and BPH.  In 01/2015 he suffered a MI and underwent stenting of the VG  RPDA with synergy drug eluting stents.  He was maintained on brilinta  and subsequently followed at the [facility].  He came off of brilinta after completion of 12 months of therapy and was doing well until several days prior to admission, when he began to experience progressive chest discomfort similar to prior angina.  He presented to the [facility] ER on 12/29 and his troponin was elevated @ 2.70.  He  was placed on heparin, ASA, and nitropaste and transferred to [facility] for further evaluation.  Hospital Course     Consultants: None   Patient eventually peaked his troponin at 3.27.  He continued to have intermittent chest discomfort radiating to his left arm and for that reason, he underwent urgent catheterization on 12/30, revealing native multivessel CAD with patent LIMA  OM1  LAD, and a new distal stenosis in the graft  RPDA.  The previously placed proximal graft  RPDA stent had only 15% in-stent restenosis.  The distal graft  RPDA was then successfully intervened upon and stented using a 3.5 x 28 mm Promus Premier DES.  Stephen Macdonald tolerated the procedure well and was subsequently monitored in cardiac ICU.  He remained stable without recurrent chest pain and was transferred out to the floor on 12/31.  He has been ambulating without recurrent symptoms or limitations and will be discharged home today in good condition.  He was felt to have mild volume overload on exam this AM and so daily lasix and KCl have been added to his regimen.  We have asked him to f/u @ the [place] within the week.  Addendum (Dr. Okey Dupre, 04/19/16 @ 08:45 AM): BMP drawn prior to discharged notable for increase in creatinine from 1.2 -> 1.6.  We will contact the patient today and instruct him to have a BMP drawn today or tomorrow to reassess his renal function and to not begin taking furosemide and KCl until after repeat labs have been drawn and he has heard from [place] or his medical team at the [facility]. _____________  Discharge Vitals Blood pressure 116/66, pulse 70, temperature 97.7 F (36.5 C), temperature source Oral, resp. rate 18, height 6\' 1"  (1.854 m), weight 238 lb 5.1 oz (108.1 kg), SpO2 95 %.  Filed Weights   04/15/16 1821 04/15/16 2100  Weight: 240 lb (108.9 kg) 238 lb 5.1 oz (108.1 kg)    Labs & Radiologic Studies    CBC  Recent Labs  04/17/16 0257 04/18/16 0246  WBC 7.5 7.9  HGB 16.0 16.1  HCT 44.7 45.4   MCV 90.7 91.0  PLT 174 191   Basic Metabolic Panel  Recent Labs  04/16/16 0350 04/17/16 0257  NA 139 138  K 3.9 3.6  CL 106 106  CO2 26 25  GLUCOSE 91 102*  BUN 13 14  CREATININE 1.17 1.21  CALCIUM 9.1 8.8*   Liver Function Tests  Recent Labs  04/15/16 1821  AST 44*  ALT 33  ALKPHOS 73  BILITOT 1.1  PROT 6.7  ALBUMIN 3.8   Cardiac Enzymes  Recent Labs  04/15/16 2143 04/16/16 0350 04/16/16 0859  TROPONINI 2.99* 3.27* 2.75*   Hemoglobin A1C  Recent Labs  04/16/16 0350  HGBA1C 5.6   Fasting Lipid Panel  Recent Labs  04/16/16 0350  CHOL 157  HDL 43  LDLCALC 95  TRIG 96  CHOLHDL 3.7   Thyroid Function Tests  Recent Labs  04/16/16 0350  TSH 4.942*   _____________  Dg Chest Portable 1 View  Result Date: 04/15/2016 CLINICAL DATA:  Patient states right upper chest  pain that went across his chest into his left arm today. Hx of CHF, coronary artery disease, hypertension, pacemaker insertion, coronary stent placement, CABG x 3, cardiac cath. Current smoker. EXAM: PORTABLE CHEST 1 VIEW COMPARISON:  11/10/2007 FINDINGS: Left-sided transvenous pacemaker leads to the right atrium, right ventricle, and coronary sinus. Status post median sternotomy and CABG. The heart is mildly enlarged. There are no focal consolidations or pleural effusions. No pulmonary edema. IMPRESSION: No active disease. Electronically Signed   By: Norva Pavlov M.D.   On: 04/15/2016 19:40   Disposition   Pt is being discharged home today in good condition.  Follow-up Plans & Appointments    Follow-up Information    [facility] Follow up.   Specialty:  General Practice Why:  Please follow-up with your cardiologist within the next 7 days. Contact information: [address]  [facility] [address] [phone]           Discharge Medications   Current Discharge Medication List    START taking these medications   Details  furosemide (LASIX) 40 MG tablet  Take 1 tablet (40 mg total) by mouth daily. Qty: 30 tablet, Refills: 3    potassium chloride 20 MEQ TBCR Take 20 mEq by mouth daily. Qty: 30 tablet, Refills: 3    ticagrelor (BRILINTA) 90 MG TABS tablet Take 1 tablet (90 mg total) by mouth 2 (two) times daily. Qty: 60 tablet, Refills: 6      CONTINUE these medications which have NOT CHANGED   Details  aspirin EC 81 MG tablet Take 81 mg by mouth every morning.     atorvastatin (LIPITOR) 80 MG tablet Take 1 tablet (80 mg total) by mouth daily. Qty: 30 tablet, Refills: 11    cholecalciferol (VITAMIN D) 1000 UNITS tablet Take 1,000 Units by mouth at bedtime.     metoprolol succinate (TOPROL-XL) 25 MG 24 hr tablet Take 25 mg by mouth every morning.    nitroGLYCERIN (NITROSTAT) 0.4 MG SL tablet Place 1 tablet (0.4 mg total) under the tongue every 5 (five) minutes as needed for chest pain. Qty: 25 tablet, Refills: 3    tamsulosin (FLOMAX) 0.4 MG CAPS capsule Take 0.4 mg by mouth every morning.    valsartan (DIOVAN) 40 MG tablet Take 40 mg by mouth at bedtime.          Aspirin prescribed at discharge?  Yes High Intensity Statin Prescribed? (Lipitor 40-80mg  or Crestor 20-40mg ): Yes Beta Blocker Prescribed? Yes For EF <40%, was ACEI/ARB Prescribed? Yes ADP Receptor Inhibitor Prescribed? (i.e. Plavix etc.-Includes Medically Managed Patients): Yes For EF <40%, Aldosterone Inhibitor Prescribed? No: [facility] to outpt cardiologist. Was EF assessed during THIS hospitalization? Yes Was Cardiac Rehab II ordered? (Included Medically managed Patients): Yes   Outstanding Labs/Studies   F/u BMET @ [facility] f/u visit w/in 1 wk.  Duration of Discharge Encounter   Greater than 30 minutes including physician time.  Signed, Nicolasa Ducking NP 04/18/2016, 12:10 PM

## 2016-04-18 NOTE — Progress Notes (Addendum)
Labs finally drawn after 1300, labs have finally resulted at this time of 1455. Will contact NP Thayer Ohm.

## 2016-04-18 NOTE — Progress Notes (Signed)
Paged PA Corine Shelter, waiting return call.

## 2016-04-19 ENCOUNTER — Encounter (HOSPITAL_COMMUNITY): Payer: Self-pay | Admitting: Internal Medicine

## 2016-04-19 ENCOUNTER — Telehealth: Payer: Self-pay | Admitting: Physician Assistant

## 2016-04-19 ENCOUNTER — Other Ambulatory Visit: Payer: Self-pay | Admitting: Physician Assistant

## 2016-04-19 DIAGNOSIS — N179 Acute kidney failure, unspecified: Secondary | ICD-10-CM

## 2016-04-19 LAB — POCT ACTIVATED CLOTTING TIME: Activated Clotting Time: 384 seconds

## 2016-04-19 MED FILL — Nitroglycerin IV Soln 100 MCG/ML in D5W: INTRA_ARTERIAL | Qty: 10 | Status: AC

## 2016-04-19 NOTE — Telephone Encounter (Signed)
Pt Scr came back as elevated to 1.62 after he discharged. Dr. Okey Dupre advised to hold lasix and Kdur. Get BMET tomorrow. F/u with cardiologist at [facility] this week. Updated the patient and he is agree with this.

## 2016-04-20 ENCOUNTER — Telehealth (HOSPITAL_COMMUNITY): Payer: Self-pay | Admitting: Nurse Practitioner

## 2016-04-20 ENCOUNTER — Other Ambulatory Visit (INDEPENDENT_AMBULATORY_CARE_PROVIDER_SITE_OTHER): Payer: Medicare Other

## 2016-04-20 DIAGNOSIS — N179 Acute kidney failure, unspecified: Secondary | ICD-10-CM

## 2016-04-21 ENCOUNTER — Telehealth: Payer: Self-pay | Admitting: Physician Assistant

## 2016-04-21 LAB — BASIC METABOLIC PANEL
BUN / CREAT RATIO: 13 (ref 10–24)
BUN: 15 mg/dL (ref 8–27)
CALCIUM: 9.2 mg/dL (ref 8.6–10.2)
CO2: 23 mmol/L (ref 18–29)
Chloride: 102 mmol/L (ref 96–106)
Creatinine, Ser: 1.2 mg/dL (ref 0.76–1.27)
GFR calc Af Amer: 70 mL/min/{1.73_m2} (ref >59)
GFR, EST NON AFRICAN AMERICAN: 60 mL/min/{1.73_m2} (ref >59)
Glucose: 97 mg/dL (ref 65–99)
POTASSIUM: 4.4 mmol/L (ref 3.5–5.2)
SODIUM: 141 mmol/L (ref 134–144)

## 2016-04-21 NOTE — Telephone Encounter (Signed)
New Message ° ° °Patient returning phone call. Unsure who called °

## 2016-04-21 NOTE — Telephone Encounter (Signed)
Reviewed results of recent labs with pt who stated understanding.

## 2016-04-25 ENCOUNTER — Telehealth: Payer: Self-pay | Admitting: Internal Medicine

## 2016-04-25 NOTE — Telephone Encounter (Signed)
S/w Aq[place]ah at Cardiac Rehab.  She said she spoke with patient and [facility] f[facility]. Patient prefers to wait to start rehab after he sees his [place] doctor. Stephen Macdonald said she will call us back if patient decides he wants rehab sooner, then we can take necessary steps in ordering this for the patient.

## 2016-04-25 NOTE — Telephone Encounter (Signed)
Pattricia Boss [place]/Aqauah caling from Cardiac [facility] asking if we can please place in a referral for them and have Dr End Sign it She states patient saw Dr End in hospital Please advise

## 2016-07-21 ENCOUNTER — Encounter (HOSPITAL_COMMUNITY): Payer: Self-pay

## 2016-07-21 ENCOUNTER — Encounter (HOSPITAL_COMMUNITY)
Admission: RE | Admit: 2016-07-21 | Discharge: 2016-07-21 | Disposition: A | Discharge status: Home / Self Care | Payer: Medicare Other | Source: Ambulatory Visit | Attending: Cardiology | Admitting: Cardiology

## 2016-07-21 VITALS — BP 112/70 | HR 76 | Ht 73.0 in | Wt 239.9 lb

## 2016-07-21 DIAGNOSIS — E7800 Pure hypercholesterolemia, unspecified: Secondary | ICD-10-CM | POA: Diagnosis not present

## 2016-07-21 DIAGNOSIS — I130 Hypertensive heart and chronic kidney disease with heart failure and stage 1 through stage 4 chronic kidney disease, or unspecified chronic kidney disease: Secondary | ICD-10-CM | POA: Diagnosis not present

## 2016-07-21 DIAGNOSIS — I214 Non-ST elevation (NSTEMI) myocardial infarction: Secondary | ICD-10-CM | POA: Insufficient documentation

## 2016-07-21 DIAGNOSIS — Z955 Presence of coronary angioplasty implant and graft: Secondary | ICD-10-CM | POA: Diagnosis not present

## 2016-07-21 DIAGNOSIS — Z7982 Long term (current) use of aspirin: Secondary | ICD-10-CM | POA: Diagnosis not present

## 2016-07-21 DIAGNOSIS — Z79899 Other long term (current) drug therapy: Secondary | ICD-10-CM | POA: Insufficient documentation

## 2016-07-21 DIAGNOSIS — F17210 Nicotine dependence, cigarettes, uncomplicated: Secondary | ICD-10-CM | POA: Insufficient documentation

## 2016-07-21 DIAGNOSIS — I5042 Chronic combined systolic (congestive) and diastolic (congestive) heart failure: Secondary | ICD-10-CM | POA: Insufficient documentation

## 2016-07-21 DIAGNOSIS — I2510 Atherosclerotic heart disease of native coronary artery without angina pectoris: Secondary | ICD-10-CM | POA: Diagnosis not present

## 2016-07-21 DIAGNOSIS — N183 Chronic kidney disease, stage 3 (moderate): Secondary | ICD-10-CM | POA: Diagnosis not present

## 2016-07-21 NOTE — Progress Notes (Signed)
Cardiac/Pulmonary Rehab Medication Review by a Pharmacist  Does the patient  feel that his/her medications are working for him/her?  yes  Has the patient been experiencing any side effects to the medications prescribed?  no  Does the patient measure his/her own blood pressure or blood glucose at home?  yes   Does the patient have any problems obtaining medications due to transportation or finances?   no  Understanding of regimen: good Understanding of indications: fair Potential of compliance: excellent  Questions asked to Determine Patient Understanding of Medication Regimen:  1. What is the name of the medication?  2. What is the medication used for?  3. When should it be taken?  4. How much should be taken?  5. How will you take it?  6. What side effects should you report?  Understanding Defined as: Excellent: All questions above are correct Good: Questions 1-4 are correct Fair: Questions 1-2 are correct  Poor: 1 or none of the above questions are correct   Pharmacist comments: Pt gets his medications from [place].  Started a new medication but can't remember the name of it.  Will try to get name from [place].  Pt also states he hasn't taken fluid pill (furosemide) in years.  No adverse effects from medications noted.    Valrie Hart A 07/21/2016 9:42 AM

## 2016-07-21 NOTE — Progress Notes (Signed)
Cardiac Individual Treatment Plan  Patient Details  Name: NYKEEM CITRO MRN: [id number] Date of Birth: May 24, 1945 Referring Provider:     CARDIAC REHAB PHASE II ORIENTATION from 07/21/2016 in [place]  Referring Provider  Dr. Janese Banks      Initial Encounter Date:    CARDIAC REHAB PHASE II ORIENTATION from 07/21/2016 in [place]  Date  07/21/16  Referring Provider  Dr. Janese Banks      Visit Diagnosis: NSTEMI (non-ST elevated myocardial infarction) [facility])  Status post coronary artery stent placement  Patient's Home Medications on Admission:  Current Outpatient Prescriptions:  .  aspirin EC 81 MG tablet, Take 81 mg by mouth every morning. , Disp: , Rfl:  .  atorvastatin (LIPITOR) 80 MG tablet, Take 1 tablet (80 mg total) by mouth daily. (Patient taking differently: Take 80 mg by mouth at bedtime. ), Disp: 30 tablet, Rfl: 11 .  cholecalciferol (VITAMIN D) 1000 UNITS tablet, Take 1,000 Units by mouth at bedtime. , Disp: , Rfl:  .  furosemide (LASIX) 40 MG tablet, Take 1 tablet (40 mg total) by mouth daily. (Patient taking differently: Take 40 mg by mouth daily as needed. ), Disp: 30 tablet, Rfl: 3 .  metoprolol succinate (TOPROL-XL) 25 MG 24 hr tablet, Take 25 mg by mouth every morning., Disp: , Rfl:  .  nitroGLYCERIN (NITROSTAT) 0.4 MG SL tablet, Place 1 tablet (0.4 mg total) under the tongue every 5 (five) minutes as needed for chest pain., Disp: 25 tablet, Rfl: 3 .  potassium chloride 20 MEQ TBCR, Take 20 mEq by mouth daily. (Patient taking differently: Take 20 mEq by mouth daily as needed. ), Disp: 30 tablet, Rfl: 3 .  tamsulosin (FLOMAX) 0.4 MG CAPS capsule, Take 0.4 mg by mouth every morning., Disp: , Rfl:  .  ticagrelor (BRILINTA) 90 MG TABS tablet, Take 1 tablet (90 mg total) by mouth 2 (two) times daily., Disp: 60 tablet, Rfl: 6 .  valsartan (DIOVAN) 40 MG tablet, Take 40 mg by mouth at bedtime. , Disp: , Rfl:   Past Medical History: Past  Medical History:  Diagnosis Date  . Chronic combined systolic and diastolic CHF, NYHA class 3 ([place])    a. 01/2015 Echo: EF 35-30%; b. 03/2016 Echo: Ef 20%, Gr1DD, mid-apical anterior, mid anteroseptal, apical inferior, basal inferolateral, mid anterolateral, and apical AK, basal anteroseptal and mid inferior severe HK, midlly dil LA, mild TR.  . CKD (chronic kidney disease), stage III 04/15/2016  . Coronary artery disease    a. 1997 s/p CABG;  b. 01/2015 MI/PCI: G->PDA;  c. 03/2016 NSTEMI/PCI: LM 20, LAD 148m D2 80, RI 60, LCX ok, OM1 90, RCA 60p, 1070mRPAV 80, G->RPDA 15p ISR, 90d (3.5x28 Promus Premier DES), LIMA->OM1->dLAD nl.  . High cholesterol   . Hypertension   . Ischemic cardiomyopathy    a. 01/2015 Echo: EF 25-30%; b. s/p AICD;  c. 03/2016 Echo: Ef 20%, Gr1DD.  . Marland Kitchenmoker     Tobacco Use: History  Smoking Status  . Current Some Day Smoker  . Types: Cigars, Cigarettes  Smokeless Tobacco  . Not on file    Comment: Discussed cessation and shared we have classes that he could attend.     Labs: Recent Review Flowsheet Data    Labs for ITP Cardiac and Pulmonary Rehab Latest Ref Rng & Units 02/12/2015 04/16/2016   Cholestrol 0 - 200 mg/dL - 157   LDLCALC 0 - 99 mg/dL - 95   HDL >40  mg/dL - 43   Trlycerides <782 mg/dL - 96   Hemoglobin N5A 4.8 - 5.6 % - 5.6   TCO2 0 - 100 mmol/L 18 -      Capillary Blood Glucose: No results found for: GLUCAP   Exercise Target Goals: Date: 07/21/16  Exercise Program Goal: Individual exercise prescription set with THRR, safety & activity barriers. Participant demonstrates ability to understand and report RPE using BORG scale, to self-measure pulse accurately, and to acknowledge the importance of the exercise prescription.  Exercise Prescription Goal: Starting with aerobic activity 30 plus minutes a day, 3 days per week for initial exercise prescription. Provide home exercise prescription and guidelines that participant acknowledges  understanding prior to discharge.  Activity Barriers & Risk Stratification:   6 Minute Walk:     6 Minute Walk    Row Name 07/21/16 0959         6 Minute Walk   Phase Initial     Distance 1700 feet     Distance % Change 0 %     Walk Time 6 minutes     # of Rest Breaks 0     MPH 3.21     METS 3.46     RPE 9     Perceived Dyspnea  9     VO2 Peak 11.54     Symptoms No     Resting HR 76 bpm     Resting BP 112/70     Max Ex. HR 100 bpm     Max Ex. BP 124/70     2 Minute Post BP 110/68        Oxygen Initial Assessment:   Oxygen Re-Evaluation:   Oxygen Discharge (Final Oxygen Re-Evaluation):   Initial Exercise Prescription:     Initial Exercise Prescription - 07/21/16 1000      Date of Initial Exercise RX and Referring Provider   Date 07/21/16   Referring Provider Dr. Smith Robert     Treadmill   MPH 2.5   Grade 0   Minutes 20   METs 2.9     NuStep   Level 2   SPM 25   Minutes 15   METs 2     Prescription Details   Frequency (times per week) 3   Duration Progress to 30 minutes of continuous aerobic without signs/symptoms of physical distress     Intensity   THRR 40-80% of Max Heartrate 105-120-134   Ratings of Perceived Exertion 11-13   Perceived Dyspnea 0-4     Progression   Progression Continue progressive overload as per policy without signs/symptoms or physical distress.     Resistance Training   Training Prescription Yes   Weight 1   Reps 10-15      Perform Capillary Blood Glucose checks as needed.  Exercise Prescription Changes:   Exercise Comments:   Exercise Goals and Review:      Exercise Goals    Row Name 07/21/16 1134             Exercise Goals   Increase Physical Activity Yes       Intervention Provide advice, education, support and counseling about physical activity/exercise needs.;Develop an individualized exercise prescription for aerobic and resistive training based on initial evaluation findings, risk  stratification, comorbidities and participant's personal goals.       Expected Outcomes Achievement of increased cardiorespiratory fitness and enhanced flexibility, muscular endurance and strength shown through measurements of functional capacity and personal statement of participant.  Increase Strength and Stamina Yes       Intervention Provide advice, education, support and counseling about physical activity/exercise needs.;Develop an individualized exercise prescription for aerobic and resistive training based on initial evaluation findings, risk stratification, comorbidities and participant's personal goals.       Expected Outcomes Achievement of increased cardiorespiratory fitness and enhanced flexibility, muscular endurance and strength shown through measurements of functional capacity and personal statement of participant.          Exercise Goals Re-Evaluation :    Discharge Exercise Prescription (Final Exercise Prescription Changes):   Nutrition:  Target Goals: Understanding of nutrition guidelines, daily intake of sodium 1500mg , cholesterol 200mg , calories 30% from fat and 7% or less from saturated fats, daily to have 5 or more servings of fruits and vegetables.  Biometrics:     Pre Biometrics - 07/21/16 1002      Pre Biometrics   Height 6\' 1"  (1.854 m)   Weight 239 lb 13.8 oz (108.8 kg)   Waist Circumference 41 inches   Hip Circumference 40.5 inches   Waist to Hip Ratio 1.01 %   BMI (Calculated) 31.7   Triceps Skinfold 8 mm   % Body Fat 26.5 %   Grip Strength 92.67 kg   Flexibility 10 in   Single Leg Stand 14 seconds       Nutrition Therapy Plan and Nutrition Goals:   Nutrition Discharge: Rate Your Plate Scores:   Nutrition Goals Re-Evaluation:   Nutrition Goals Discharge (Final Nutrition Goals Re-Evaluation):   Psychosocial: Target Goals: Acknowledge presence or absence of significant depression and/or stress, maximize coping skills, provide  positive support system. Participant is able to verbalize types and ability to use techniques and skills needed for reducing stress and depression.  Initial Review & Psychosocial Screening:     Initial Psych Review & Screening - 07/21/16 1141      Initial Review   Current issues with Current Depression     Family Dynamics   Good Support System? Yes     Barriers   Psychosocial barriers to participate in program Psychosocial barriers identified (see note)  He is a little down because of his recent heart problems. He says he is feeling better.      Screening Interventions   Interventions Encouraged to exercise      Quality of Life Scores:     Quality of Life - 07/21/16 1002      Quality of Life Scores   Health/Function Pre 16.23 %   Socioeconomic Pre 24.75 %   Psych/Spiritual Pre 21.14 %   Family Pre 28.8 %   GLOBAL Pre 20.73 %      PHQ-9: Recent Review Flowsheet Data    Depression screen [facility] 2/9 07/21/2016 11/05/2015 06/16/2015   Decreased Interest 1 1 1    Down, Depressed, Hopeless 0 0 1   PHQ - 2 Score 1 1 2    Altered sleeping 0 0 0   Tired, decreased energy 2 1 0   Change in appetite 0 0 0   Feeling bad or failure about yourself  0 0 0   Trouble concentrating 0 0 0   Moving slowly or fidgety/restless 0 0 0   Suicidal thoughts 0 0 0   PHQ-9 Score 3 2 2    Difficult doing work/chores Somewhat difficult Somewhat difficult Somewhat difficult     Interpretation of Total Score  Total Score Depression Severity:  1-4 = Minimal depression, 5-9 = Mild depression, 10-14 = Moderate depression, 15-19 =  Moderately severe depression, 20-27 = Severe depression   Psychosocial Evaluation and Intervention:     Psychosocial Evaluation - 07/21/16 1142      Psychosocial Evaluation & Interventions   Interventions Encouraged to exercise with the program and follow exercise prescription   Comments Patient retired [employer] with grown children. Just a little depression.  His current  health issues has him stressed. Involved in community game like dominoes. He also plays the guitar.    Continue Psychosocial Services  No Follow up required      Psychosocial Re-Evaluation:     Psychosocial Re-Evaluation    Row Name 07/21/16 1144             Psychosocial Re-Evaluation   Current issues with Current Depression       Interventions Encouraged to attend Pulmonary Rehabilitation for the exercise       Continue Psychosocial Services  No Follow up required          Psychosocial Discharge (Final Psychosocial Re-Evaluation):     Psychosocial Re-Evaluation - 07/21/16 1144      Psychosocial Re-Evaluation   Current issues with Current Depression   Interventions Encouraged to attend Pulmonary Rehabilitation for the exercise   Continue Psychosocial Services  No Follow up required      Vocational Rehabilitation: Provide vocational rehab assistance to qualifying candidates.   Vocational Rehab Evaluation & Intervention:     Vocational Rehab - 07/21/16 1130      Initial Vocational Rehab Evaluation & Intervention   Assessment shows need for Vocational Rehabilitation No      Education: Education Goals: Education classes will be provided on a weekly basis, covering required topics. Participant will state understanding/return demonstration of topics presented.  Learning Barriers/Preferences:     Learning Barriers/Preferences - 07/21/16 1129      Learning Barriers/Preferences   Learning Barriers None   Learning Preferences Skilled Demonstration;Individual Instruction;Group Instruction      Education Topics: Hypertension, Hypertension Reduction -Define heart disease and high blood pressure. Discus how high blood pressure affects the body and ways to reduce high blood pressure.   CARDIAC REHAB PHASE II EXERCISE from 09/16/2015 in [place] CARDIAC REHABILITATION  Date  08/19/15  Educator  Hart [place]  Instruction Review Code  2- meets goals/outcomes       Exercise and Your Heart -Discuss why it is important to exercise, the FITT principles of exercise, normal and abnormal responses to exercise, and how to exercise safely.   CARDIAC REHAB PHASE II EXERCISE from 09/16/2015 in [place] CARDIAC REHABILITATION  Date  08/26/15  Educator  Hart [place]  Instruction Review Code  2- meets goals/outcomes      Angina -Discuss definition of angina, causes of angina, treatment of angina, and how to decrease risk of having angina.   CARDIAC REHAB PHASE II EXERCISE from 09/16/2015 in [place] CARDIAC REHABILITATION  Date  09/02/15  Educator  DCoad  Instruction Review Code  2- meets goals/outcomes      Cardiac Medications -Review what the following cardiac medications are used for, how they affect the body, and side effects that may occur when taking the medications.  Medications include Aspirin, Beta blockers, calcium channel blockers, ACE Inhibitors, angiotensin receptor blockers, diuretics, digoxin, and antihyperlipidemics.   Congestive Heart Failure -Discuss the definition of CHF, how to live with CHF, the signs and symptoms of CHF, and how keep track of weight and sodium intake.   CARDIAC REHAB PHASE II EXERCISE from 09/16/2015 in [facility] CARDIAC REHABILITATION  Date  09/16/15  Educator  DC  Instruction Review Code  2- meets goals/outcomes      Heart Disease and Intimacy -Discus the effect sexual activity has on the heart, how changes occur during intimacy as we age, and safety during sexual activity.   CARDIAC REHAB PHASE II EXERCISE from 09/16/2015 in [place] CARDIAC REHABILITATION  Date  06/24/15  Educator  Hart Rochester  Instruction Review Code  2- meets goals/outcomes      Smoking Cessation / COPD -Discuss different methods to quit smoking, the health benefits of quitting smoking, and the definition of COPD.   CARDIAC REHAB PHASE II EXERCISE from 09/16/2015 in [place] CARDIAC REHABILITATION  Date  07/01/15  Educator   Hart Rochester  Instruction Review Code  2- meets goals/outcomes      Nutrition I: Fats -Discuss the types of cholesterol, what cholesterol does to the heart, and how cholesterol levels can be controlled.   CARDIAC REHAB PHASE II EXERCISE from 09/16/2015 in [place] CARDIAC REHABILITATION  Date  07/08/15  Educator  Hart Rochester  Instruction Review Code  2- meets goals/outcomes      Nutrition II: Labels -Discuss the different components of food labels and how to read food label   CARDIAC REHAB PHASE II EXERCISE from 09/16/2015 in [place] CARDIAC REHABILITATION  Date  07/15/15  Educator  Hart Rochester  Instruction Review Code  2- meets goals/outcomes      Heart Parts and Heart Disease -Discuss the anatomy of the heart, the pathway of blood circulation through the heart, and these are affected by heart disease.   CARDIAC REHAB PHASE II EXERCISE from 09/16/2015 in [place] CARDIAC REHABILITATION  Date  07/22/15  Educator  Hart Rochester  Instruction Review Code  2- meets goals/outcomes      Stress I: Signs and Symptoms -Discuss the causes of stress, how stress may lead to anxiety and depression, and ways to limit stress.   CARDIAC REHAB PHASE II EXERCISE from 09/16/2015 in [place] CARDIAC REHABILITATION  Date  07/29/15  Educator  Hart Rochester  Instruction Review Code  2- meets goals/outcomes      Stress II: Relaxation -Discuss different types of relaxation techniques to limit stress.   CARDIAC REHAB PHASE II EXERCISE from 09/16/2015 in [place] CARDIAC REHABILITATION  Date  08/05/15  Educator  Hart Rochester  Instruction Review Code  2- meets goals/outcomes      Warning Signs of Stroke / TIA -Discuss definition of a stroke, what the signs and symptoms are of a stroke, and how to identify when someone is having stroke.   CARDIAC REHAB PHASE II EXERCISE from 09/16/2015 in [place] CARDIAC REHABILITATION  Date  08/12/15  Educator  Hart Rochester  Instruction Review Code  2- meets  goals/outcomes      Knowledge Questionnaire Score:     Knowledge Questionnaire Score - 07/21/16 1130      Knowledge Questionnaire Score   Pre Score 27/28      Core Components/Risk Factors/Patient Goals at Admission:     Personal Goals and Risk Factors at Admission - 07/21/16 1135      Core Components/Risk Factors/Patient Goals on Admission    Weight Management Yes   Intervention Weight Management/Obesity: Establish reasonable short term and long term weight goals.   Admit Weight 240 lb (108.9 kg)   Goal Weight: Short Term 235 lb (106.6 kg)   Goal Weight: Long Term 230 lb (104.3 kg)   Expected Outcomes Short Term:  Continue to assess and modify interventions until short term weight is achieved;Long Term: Adherence to nutrition and physical activity/exercise program aimed toward attainment of established weight goal   Tobacco Cessation Yes   Number of packs per day --  he has cut down to 1 cigar/day   Intervention Assist the participant in steps to quit. Provide individualized education and counseling about committing to Tobacco Cessation, relapse prevention, and pharmacological support that can be provided by physician.   Expected Outcomes Short Term: Will demonstrate readiness to quit, by selecting a quit date.;Long Term: Complete abstinence from all tobacco products for at least 12 months from quit date.   Lipids --  They are now under control   Personal Goal Other Yes   Personal Goal Regain his funcational capacity. Lose 10 lbs.    Intervention Attend CR 3xweek and supplement exercise at home 2 x week.    Expected Outcomes Achieve personal goals.       Core Components/Risk Factors/Patient Goals Review:      Goals and Risk Factor Review    Row Name 07/21/16 1140             Core Components/Risk Factors/Patient Goals Review   Personal Goals Review Weight Management/Obesity          Core Components/Risk Factors/Patient Goals at Discharge (Final Review):       Goals and Risk Factor Review - 07/21/16 1140      Core Components/Risk Factors/Patient Goals Review   Personal Goals Review Weight Management/Obesity      ITP Comments:   Comments: Patient arrived for 1st visit/orientation/education at 0800. Patient was referred to CR by Dr. Smith Robert from [facility] due to NSTEMI (I21.4) and Stent Placement (Z95.5). During orientation advised patient on arrival and appointment times what to wear, what to do before, during and after exercise. Reviewed attendance and class policy. Talked about inclement weather and class consultation policy. Pt is scheduled to return Cardiac Rehab on 07/25/16 at 0930. Pt was advised to come to class 15 minutes before class starts. Patient was also given instructions on meeting with the dietician and attending the Family Structure classes. Pt is eager to get started. Patient participated in warm up stretches followed by light weights and resistance bands. No abnormal S/S. Patient was able to complete 6 minute walk test. Patient was measured for the equipment. Discussed equipment safety with patient. Took patient pre-anthropometric measurements. Patient finished visit at 10:00.

## 2016-07-21 NOTE — Progress Notes (Signed)
Cardiac Individual Treatment Plan  Patient Details  Name: Stephen Macdonald MRN: [id number] Date of Birth: May 24, 1945 Referring Provider:     CARDIAC REHAB PHASE II ORIENTATION from 07/21/2016 in [place]  Referring Provider  Dr. Janese Banks      Initial Encounter Date:    CARDIAC REHAB PHASE II ORIENTATION from 07/21/2016 in [place]  Date  07/21/16  Referring Provider  Dr. Janese Banks      Visit Diagnosis: NSTEMI (non-ST elevated myocardial infarction) [facility])  Status post coronary artery stent placement  Patient's Home Medications on Admission:  Current Outpatient Prescriptions:  .  aspirin EC 81 MG tablet, Take 81 mg by mouth every morning. , Disp: , Rfl:  .  atorvastatin (LIPITOR) 80 MG tablet, Take 1 tablet (80 mg total) by mouth daily. (Patient taking differently: Take 80 mg by mouth at bedtime. ), Disp: 30 tablet, Rfl: 11 .  cholecalciferol (VITAMIN D) 1000 UNITS tablet, Take 1,000 Units by mouth at bedtime. , Disp: , Rfl:  .  furosemide (LASIX) 40 MG tablet, Take 1 tablet (40 mg total) by mouth daily. (Patient taking differently: Take 40 mg by mouth daily as needed. ), Disp: 30 tablet, Rfl: 3 .  metoprolol succinate (TOPROL-XL) 25 MG 24 hr tablet, Take 25 mg by mouth every morning., Disp: , Rfl:  .  nitroGLYCERIN (NITROSTAT) 0.4 MG SL tablet, Place 1 tablet (0.4 mg total) under the tongue every 5 (five) minutes as needed for chest pain., Disp: 25 tablet, Rfl: 3 .  potassium chloride 20 MEQ TBCR, Take 20 mEq by mouth daily. (Patient taking differently: Take 20 mEq by mouth daily as needed. ), Disp: 30 tablet, Rfl: 3 .  tamsulosin (FLOMAX) 0.4 MG CAPS capsule, Take 0.4 mg by mouth every morning., Disp: , Rfl:  .  ticagrelor (BRILINTA) 90 MG TABS tablet, Take 1 tablet (90 mg total) by mouth 2 (two) times daily., Disp: 60 tablet, Rfl: 6 .  valsartan (DIOVAN) 40 MG tablet, Take 40 mg by mouth at bedtime. , Disp: , Rfl:   Past Medical History: Past  Medical History:  Diagnosis Date  . Chronic combined systolic and diastolic CHF, NYHA class 3 ([place])    a. 01/2015 Echo: EF 35-30%; b. 03/2016 Echo: Ef 20%, Gr1DD, mid-apical anterior, mid anteroseptal, apical inferior, basal inferolateral, mid anterolateral, and apical AK, basal anteroseptal and mid inferior severe HK, midlly dil LA, mild TR.  . CKD (chronic kidney disease), stage III 04/15/2016  . Coronary artery disease    a. 1997 s/p CABG;  b. 01/2015 MI/PCI: G->PDA;  c. 03/2016 NSTEMI/PCI: LM 20, LAD 148m D2 80, RI 60, LCX ok, OM1 90, RCA 60p, 1070mRPAV 80, G->RPDA 15p ISR, 90d (3.5x28 Promus Premier DES), LIMA->OM1->dLAD nl.  . High cholesterol   . Hypertension   . Ischemic cardiomyopathy    a. 01/2015 Echo: EF 25-30%; b. s/p AICD;  c. 03/2016 Echo: Ef 20%, Gr1DD.  . Marland Kitchenmoker     Tobacco Use: History  Smoking Status  . Current Some Day Smoker  . Types: Cigars, Cigarettes  Smokeless Tobacco  . Not on file    Comment: Discussed cessation and shared we have classes that he could attend.     Labs: Recent Review Flowsheet Data    Labs for ITP Cardiac and Pulmonary Rehab Latest Ref Rng & Units 02/12/2015 04/16/2016   Cholestrol 0 - 200 mg/dL - 157   LDLCALC 0 - 99 mg/dL - 95   HDL >40  mg/dL - 43   Trlycerides <213 mg/dL - 96   Hemoglobin Y8M 4.8 - 5.6 % - 5.6   TCO2 0 - 100 mmol/L 18 -      Capillary Blood Glucose: No results found for: GLUCAP   Exercise Target Goals: Date: 07/21/16  Exercise Program Goal: Individual exercise prescription set with THRR, safety & activity barriers. Participant demonstrates ability to understand and report RPE using BORG scale, to self-measure pulse accurately, and to acknowledge the importance of the exercise prescription.  Exercise Prescription Goal: Starting with aerobic activity 30 plus minutes a day, 3 days per week for initial exercise prescription. Provide home exercise prescription and guidelines that participant acknowledges  understanding prior to discharge.  Activity Barriers & Risk Stratification:   6 Minute Walk:     6 Minute Walk    Row Name 07/21/16 0959         6 Minute Walk   Phase Initial     Distance 1700 feet     Distance % Change 0 %     Walk Time 6 minutes     # of Rest Breaks 0     MPH 3.21     METS 3.46     RPE 9     Perceived Dyspnea  9     VO2 Peak 11.54     Symptoms No     Resting HR 76 bpm     Resting BP 112/70     Max Ex. HR 100 bpm     Max Ex. BP 124/70     2 Minute Post BP 110/68        Oxygen Initial Assessment:   Oxygen Re-Evaluation:   Oxygen Discharge (Final Oxygen Re-Evaluation):   Initial Exercise Prescription:     Initial Exercise Prescription - 07/21/16 1000      Date of Initial Exercise RX and Referring Provider   Date 07/21/16   Referring Provider Dr. Smith Robert     Treadmill   MPH 2.5   Grade 0   Minutes 20   METs 2.9     NuStep   Level 2   SPM 25   Minutes 15   METs 2     Prescription Details   Frequency (times per week) 3   Duration Progress to 30 minutes of continuous aerobic without signs/symptoms of physical distress     Intensity   THRR 40-80% of Max Heartrate 105-120-134   Ratings of Perceived Exertion 11-13   Perceived Dyspnea 0-4     Progression   Progression Continue progressive overload as per policy without signs/symptoms or physical distress.     Resistance Training   Training Prescription Yes   Weight 1   Reps 10-15      Perform Capillary Blood Glucose checks as needed.  Exercise Prescription Changes:   Exercise Comments:   Exercise Goals and Review:     Exercise Goals    Row Name 07/21/16 1134             Exercise Goals   Increase Physical Activity Yes       Intervention Provide advice, education, support and counseling about physical activity/exercise needs.;Develop an individualized exercise prescription for aerobic and resistive training based on initial evaluation findings, risk  stratification, comorbidities and participant's personal goals.       Expected Outcomes Achievement of increased cardiorespiratory fitness and enhanced flexibility, muscular endurance and strength shown through measurements of functional capacity and personal statement of participant.  Increase Strength and Stamina Yes       Intervention Provide advice, education, support and counseling about physical activity/exercise needs.;Develop an individualized exercise prescription for aerobic and resistive training based on initial evaluation findings, risk stratification, comorbidities and participant's personal goals.       Expected Outcomes Achievement of increased cardiorespiratory fitness and enhanced flexibility, muscular endurance and strength shown through measurements of functional capacity and personal statement of participant.          Exercise Goals Re-Evaluation :    Discharge Exercise Prescription (Final Exercise Prescription Changes):   Nutrition:  Target Goals: Understanding of nutrition guidelines, daily intake of sodium 1500mg , cholesterol 200mg , calories 30% from fat and 7% or less from saturated fats, daily to have 5 or more servings of fruits and vegetables.  Biometrics:     Pre Biometrics - 07/21/16 1002      Pre Biometrics   Height 6\' 1"  (1.854 m)   Weight 239 lb 13.8 oz (108.8 kg)   Waist Circumference 41 inches   Hip Circumference 40.5 inches   Waist to Hip Ratio 1.01 %   BMI (Calculated) 31.7   Triceps Skinfold 8 mm   % Body Fat 26.5 %   Grip Strength 92.67 kg   Flexibility 10 in   Single Leg Stand 14 seconds       Nutrition Therapy Plan and Nutrition Goals:   Nutrition Discharge: Rate Your Plate Scores:   Nutrition Goals Re-Evaluation:   Nutrition Goals Discharge (Final Nutrition Goals Re-Evaluation):   Psychosocial: Target Goals: Acknowledge presence or absence of significant depression and/or stress, maximize coping skills, provide  positive support system. Participant is able to verbalize types and ability to use techniques and skills needed for reducing stress and depression.  Initial Review & Psychosocial Screening:     Initial Psych Review & Screening - 07/21/16 1141      Initial Review   Current issues with Current Depression     Family Dynamics   Good Support System? Yes     Barriers   Psychosocial barriers to participate in program Psychosocial barriers identified (see note)  He is a little down because of his recent heart problems. He says he is feeling better.      Screening Interventions   Interventions Encouraged to exercise      Quality of Life Scores:     Quality of Life - 07/21/16 1002      Quality of Life Scores   Health/Function Pre 16.23 %   Socioeconomic Pre 24.75 %   Psych/Spiritual Pre 21.14 %   Family Pre 28.8 %   GLOBAL Pre 20.73 %      PHQ-9: Recent Review Flowsheet Data    Depression screen [facility] 2/9 07/21/2016 11/05/2015 06/16/2015   Decreased Interest 1 1 1    Down, Depressed, Hopeless 0 0 1   PHQ - 2 Score 1 1 2    Altered sleeping 0 0 0   Tired, decreased energy 2 1 0   Change in appetite 0 0 0   Feeling bad or failure about yourself  0 0 0   Trouble concentrating 0 0 0   Moving slowly or fidgety/restless 0 0 0   Suicidal thoughts 0 0 0   PHQ-9 Score 3 2 2    Difficult doing work/chores Somewhat difficult Somewhat difficult Somewhat difficult     Interpretation of Total Score  Total Score Depression Severity:  1-4 = Minimal depression, 5-9 = Mild depression, 10-14 = Moderate depression, 15-19 =  Moderately severe depression, 20-27 = Severe depression   Psychosocial Evaluation and Intervention:     Psychosocial Evaluation - 07/21/16 1142      Psychosocial Evaluation & Interventions   Interventions Encouraged to exercise with the program and follow exercise prescription   Comments Patient retired [place] with grown children. Just a little depression.  His current  health issues has him stressed. Involved in community game like dominoes. He also plays the guitar.    Continue Psychosocial Services  No Follow up required      Psychosocial Re-Evaluation:     Psychosocial Re-Evaluation    Row Name 07/21/16 1144             Psychosocial Re-Evaluation   Current issues with Current Depression       Interventions Encouraged to attend Pulmonary Rehabilitation for the exercise       Continue Psychosocial Services  No Follow up required          Psychosocial Discharge (Final Psychosocial Re-Evaluation):     Psychosocial Re-Evaluation - 07/21/16 1144      Psychosocial Re-Evaluation   Current issues with Current Depression   Interventions Encouraged to attend Pulmonary Rehabilitation for the exercise   Continue Psychosocial Services  No Follow up required      Vocational Rehabilitation: Provide vocational rehab assistance to qualifying candidates.   Vocational Rehab Evaluation & Intervention:     Vocational Rehab - 07/21/16 1130      Initial Vocational Rehab Evaluation & Intervention   Assessment shows need for Vocational Rehabilitation No      Education: Education Goals: Education classes will be provided on a weekly basis, covering required topics. Participant will state understanding/return demonstration of topics presented.  Learning Barriers/Preferences:     Learning Barriers/Preferences - 07/21/16 1129      Learning Barriers/Preferences   Learning Barriers None   Learning Preferences Skilled Demonstration;Individual Instruction;Group Instruction      Education Topics: Hypertension, Hypertension Reduction -Define heart disease and high blood pressure. Discus how high blood pressure affects the body and ways to reduce high blood pressure.   CARDIAC REHAB PHASE II EXERCISE from 09/16/2015 in [place] CARDIAC REHABILITATION  Date  08/19/15  Educator  Hart Rochester  Instruction Review Code  2- meets goals/outcomes       Exercise and Your Heart -Discuss why it is important to exercise, the FITT principles of exercise, normal and abnormal responses to exercise, and how to exercise safely.   CARDIAC REHAB PHASE II EXERCISE from 09/16/2015 in [place] CARDIAC REHABILITATION  Date  08/26/15  Educator  Hart Rochester  Instruction Review Code  2- meets goals/outcomes      Angina -Discuss definition of angina, causes of angina, treatment of angina, and how to decrease risk of having angina.   CARDIAC REHAB PHASE II EXERCISE from 09/16/2015 in [place] CARDIAC REHABILITATION  Date  09/02/15  Educator  DCoad  Instruction Review Code  2- meets goals/outcomes      Cardiac Medications -Review what the following cardiac medications are used for, how they affect the body, and side effects that may occur when taking the medications.  Medications include Aspirin, Beta blockers, calcium channel blockers, ACE Inhibitors, angiotensin receptor blockers, diuretics, digoxin, and antihyperlipidemics.   Congestive Heart Failure -Discuss the definition of CHF, how to live with CHF, the signs and symptoms of CHF, and how keep track of weight and sodium intake.   CARDIAC REHAB PHASE II EXERCISE from 09/16/2015 in [facility] CARDIAC REHABILITATION  Date  09/16/15  Educator  DC  Instruction Review Code  2- meets goals/outcomes      Heart Disease and Intimacy -Discus the effect sexual activity has on the heart, how changes occur during intimacy as we age, and safety during sexual activity.   CARDIAC REHAB PHASE II EXERCISE from 09/16/2015 in [place] CARDIAC REHABILITATION  Date  06/24/15  Educator  Hart Rochester  Instruction Review Code  2- meets goals/outcomes      Smoking Cessation / COPD -Discuss different methods to quit smoking, the health benefits of quitting smoking, and the definition of COPD.   CARDIAC REHAB PHASE II EXERCISE from 09/16/2015 in [place] CARDIAC REHABILITATION  Date  07/01/15  Educator   Hart Rochester  Instruction Review Code  2- meets goals/outcomes      Nutrition I: Fats -Discuss the types of cholesterol, what cholesterol does to the heart, and how cholesterol levels can be controlled.   CARDIAC REHAB PHASE II EXERCISE from 09/16/2015 in [place] CARDIAC REHABILITATION  Date  07/08/15  Educator  Hart Rochester  Instruction Review Code  2- meets goals/outcomes      Nutrition II: Labels -Discuss the different components of food labels and how to read food label   CARDIAC REHAB PHASE II EXERCISE from 09/16/2015 in [place] CARDIAC REHABILITATION  Date  07/15/15  Educator  Hart Rochester  Instruction Review Code  2- meets goals/outcomes      Heart Parts and Heart Disease -Discuss the anatomy of the heart, the pathway of blood circulation through the heart, and these are affected by heart disease.   CARDIAC REHAB PHASE II EXERCISE from 09/16/2015 in [place] CARDIAC REHABILITATION  Date  07/22/15  Educator  Hart Rochester  Instruction Review Code  2- meets goals/outcomes      Stress I: Signs and Symptoms -Discuss the causes of stress, how stress may lead to anxiety and depression, and ways to limit stress.   CARDIAC REHAB PHASE II EXERCISE from 09/16/2015 in [place] CARDIAC REHABILITATION  Date  07/29/15  Educator  Hart Rochester  Instruction Review Code  2- meets goals/outcomes      Stress II: Relaxation -Discuss different types of relaxation techniques to limit stress.   CARDIAC REHAB PHASE II EXERCISE from 09/16/2015 in [place] CARDIAC REHABILITATION  Date  08/05/15  Educator  Hart Rochester  Instruction Review Code  2- meets goals/outcomes      Warning Signs of Stroke / TIA -Discuss definition of a stroke, what the signs and symptoms are of a stroke, and how to identify when someone is having stroke.   CARDIAC REHAB PHASE II EXERCISE from 09/16/2015 in [place] CARDIAC REHABILITATION  Date  08/12/15  Educator  Hart Rochester  Instruction Review Code  2- meets  goals/outcomes      Knowledge Questionnaire Score:     Knowledge Questionnaire Score - 07/21/16 1130      Knowledge Questionnaire Score   Pre Score 27/28      Core Components/Risk Factors/Patient Goals at Admission:     Personal Goals and Risk Factors at Admission - 07/21/16 1135      Core Components/Risk Factors/Patient Goals on Admission    Weight Management Yes   Intervention Weight Management/Obesity: Establish reasonable short term and long term weight goals.   Admit Weight 240 lb (108.9 kg)   Goal Weight: Short Term 235 lb (106.6 kg)   Goal Weight: Long Term 230 lb (104.3 kg)   Expected Outcomes Short Term:  Continue to assess and modify interventions until short term weight is achieved;Long Term: Adherence to nutrition and physical activity/exercise program aimed toward attainment of established weight goal   Tobacco Cessation Yes   Number of packs per day --  he has cut down to 1 cigar/day   Intervention Assist the participant in steps to quit. Provide individualized education and counseling about committing to Tobacco Cessation, relapse prevention, and pharmacological support that can be provided by physician.   Expected Outcomes Short Term: Will demonstrate readiness to quit, by selecting a quit date.;Long Term: Complete abstinence from all tobacco products for at least 12 months from quit date.   Lipids --  They are now under control   Personal Goal Other Yes   Personal Goal Regain his funcational capacity. Lose 10 lbs.    Intervention Attend CR 3xweek and supplement exercise at home 2 x week.    Expected Outcomes Achieve personal goals.       Core Components/Risk Factors/Patient Goals Review:      Goals and Risk Factor Review    Row Name 07/21/16 1140             Core Components/Risk Factors/Patient Goals Review   Personal Goals Review Weight Management/Obesity          Core Components/Risk Factors/Patient Goals at Discharge (Final Review):       Goals and Risk Factor Review - 07/21/16 1140      Core Components/Risk Factors/Patient Goals Review   Personal Goals Review Weight Management/Obesity      ITP Comments:     ITP Comments    Row Name 07/21/16 1419           ITP Comments Patient new to program. Plans to start Monday 07/25/16.          Comments: ITP 30 Day REVIEW Patient new to program. Plans to start Monday 07/25/16.

## 2016-07-25 ENCOUNTER — Encounter (HOSPITAL_COMMUNITY)
Admission: RE | Admit: 2016-07-25 | Discharge: 2016-07-25 | Disposition: A | Discharge status: Home / Self Care | Payer: Medicare Other | Source: Ambulatory Visit | Attending: Cardiology | Admitting: Cardiology

## 2016-07-25 DIAGNOSIS — Z955 Presence of coronary angioplasty implant and graft: Secondary | ICD-10-CM

## 2016-07-25 DIAGNOSIS — I214 Non-ST elevation (NSTEMI) myocardial infarction: Secondary | ICD-10-CM | POA: Diagnosis not present

## 2016-07-25 NOTE — Progress Notes (Signed)
Daily Session Note  Patient Details  Name: Stephen Macdonald MRN: [id number] Date of Birth: [birth year] Referring Provider:     [place] from 07/21/2016 in [place]  Referring Provider  Dr. Janese Banks      Encounter Date: 07/25/2016  Check In:     Session Check In - 07/25/16 0930      Check-In   Location [facility]-Cardiac & Pulmonary Rehab   Staff Present Russella Dar, MS, EP, [facility], Exercise Physiologist;Gregory Luther Parody, BS, EP, Exercise Physiologist   Supervising physician immediately available to respond to emergencies See telemetry face sheet for immediately available MD   Medication changes reported     No   Fall or balance concerns reported    No   Warm-up and Cool-down Performed as group-led instruction   Resistance Training Performed Yes   VAD Patient? No     Pain Assessment   Currently in Pain? No/denies   Pain Score 0-No pain   Multiple Pain Sites No      Capillary Blood Glucose: No results found for this or any previous visit (from the past 24 hour(s)).    History  Smoking Status  . Current Some Day Smoker  . Types: Cigars, Cigarettes  Smokeless Tobacco  . Not on file    Comment: Discussed cessation and shared we have classes that he could attend.     Goals Met:  Independence with exercise equipment Exercise tolerated well No report of cardiac concerns or symptoms Strength training completed today  Goals Unmet:  Not Applicable  Comments: Check out 1030.   Dr. Kate Sable is Medical Director for [facility] Cardiac and Pulmonary Rehab.

## 2016-07-27 ENCOUNTER — Encounter (HOSPITAL_COMMUNITY)
Admission: RE | Admit: 2016-07-27 | Discharge: 2016-07-27 | Disposition: A | Discharge status: Home / Self Care | Payer: Medicare Other | Source: Ambulatory Visit | Attending: Cardiology | Admitting: Cardiology

## 2016-07-27 DIAGNOSIS — I214 Non-ST elevation (NSTEMI) myocardial infarction: Secondary | ICD-10-CM

## 2016-07-27 DIAGNOSIS — Z955 Presence of coronary angioplasty implant and graft: Secondary | ICD-10-CM

## 2016-07-27 NOTE — Progress Notes (Signed)
Daily Session Note  Patient Details  Name: Stephen Macdonald MRN: [id number] Date of Birth: [birth year] Referring Provider:     CARDIAC REHAB PHASE II ORIENTATION from 07/21/2016 in [place]  Referring Provider  Dr. Janese Banks      Encounter Date: 07/27/2016  Check In:     Session Check In - 07/27/16 0930      Check-In   Location [facility]-Card[facility] Pul[facility]   Staff Present Russella Dar, MS, EP, [facility], Exercise Physiologist;Gregory Luther Parody, BS, EP, Exercise Physiologist;Lylian Sanagustin Wynetta Emery, RN, BSN   Supervising physician immediately available to respond to emergencies See telemetry face sheet for immediately available MD   Medication changes reported     No   Fall or balance concerns reported    No   Warm-up and Cool-down Performed as group-led instruction   Resistance Training Performed Yes   VAD Patient? No     Pain Assessment   Currently in Pain? No/denies   Pain Score 0-No pain   Multiple Pain Sites No      Capillary Blood Glucose: No results found for this or any previous visit (from the past 24 hour(s)).    History  Smoking Status  . Current Some Day Smoker  . Types: Cigars, Cigarettes  Smokeless Tobacco  . Not on file    Comment: Discussed cessation and shared we have classes that he could attend.     Goals Met:  Independence with exercise equipment Exercise tolerated well No report of cardiac concerns or symptoms Strength training completed today  Goals Unmet:  Not Applicable  Comments: Check out 1030.   Dr. Kate Sable is Medical Director for [facility] Cardiac and Pulmonary Rehab.

## 2016-07-29 ENCOUNTER — Encounter (HOSPITAL_COMMUNITY)
Admission: RE | Admit: 2016-07-29 | Discharge: 2016-07-29 | Disposition: A | Discharge status: Home / Self Care | Payer: Medicare Other | Source: Ambulatory Visit | Attending: Cardiology | Admitting: Cardiology

## 2016-07-29 DIAGNOSIS — I214 Non-ST elevation (NSTEMI) myocardial infarction: Secondary | ICD-10-CM

## 2016-07-29 DIAGNOSIS — Z955 Presence of coronary angioplasty implant and graft: Secondary | ICD-10-CM

## 2016-07-29 NOTE — Progress Notes (Signed)
Daily Session Note  Patient Details  Name: Stephen Macdonald MRN: [id number] Date of Birth: 03-Oct-1945 Referring Provider:     [place] from 07/21/2016 in [place]  Referring Provider  Dr. Janese Banks      Encounter Date: 07/29/2016  Check In:     Session Check In - 07/29/16 0954      Check-In   Location [facility]-Card[facility] Pul[facility]   Staff Present Diane Angelina Pih, MS, EP, [facility], Exercise Physiologist;Charnetta Wulff Wynetta Emery, RN, BSN;Gregory Cowan, BS, EP, Exercise Physiologist   Supervising physician immediately available to respond to emergencies See telemetry face sheet for immediately available MD   Medication changes reported     No   Warm-up and Cool-down Performed as group-led instruction   Resistance Training Performed Yes   VAD Patient? No     Pain Assessment   Currently in Pain? No/denies   Pain Score 0-No pain   Multiple Pain Sites No      Capillary Blood Glucose: No results found for this or any previous visit (from the past 24 hour(s)).    History  Smoking Status  . Current Some Day Smoker  . Types: Cigars, Cigarettes  Smokeless Tobacco  . Not on file    Comment: Discussed cessation and shared we have classes that he could attend.     Goals Met:  Independence with exercise equipment Exercise tolerated well No report of cardiac concerns or symptoms Strength training completed today  Goals Unmet:  Not Applicable  Comments: Check out 1030.   Dr. Kate Sable is Medical Director for [facility] Cardiac and Pulmonary Rehab.

## 2016-08-01 ENCOUNTER — Encounter (HOSPITAL_COMMUNITY)
Admission: RE | Admit: 2016-08-01 | Discharge: 2016-08-01 | Disposition: A | Discharge status: Home / Self Care | Payer: Medicare Other | Source: Ambulatory Visit | Attending: Cardiology | Admitting: Cardiology

## 2016-08-01 DIAGNOSIS — Z955 Presence of coronary angioplasty implant and graft: Secondary | ICD-10-CM

## 2016-08-01 DIAGNOSIS — I214 Non-ST elevation (NSTEMI) myocardial infarction: Secondary | ICD-10-CM | POA: Diagnosis not present

## 2016-08-01 NOTE — Progress Notes (Signed)
Daily Session Note  Patient Details  Name: Stephen Macdonald MRN: [id number] Date of Birth: 08/13/1945 Referring Provider:     [place] from 07/21/2016 in [place]  Referring Provider  Dr. Janese Banks      Encounter Date: 08/01/2016  Check In:     Session Check In - 08/01/16 0930      Check-In   Location [facility]   Staff Present Aundra Dubin, RN, BSN;Gregory Luther Parody, BS, EP, Exercise Physiologist   Supervising physician immediately available to respond to emergencies See telemetry face sheet for immediately available MD   Medication changes reported     No   Fall or balance concerns reported    No   Warm-up and Cool-down Performed as group-led instruction   Resistance Training Performed Yes   VAD Patient? No     Pain Assessment   Currently in Pain? No/denies   Pain Score 0-No pain   Multiple Pain Sites No      Capillary Blood Glucose: No results found for this or any previous visit (from the past 24 hour(s)).    History  Smoking Status  . Current Some Day Smoker  . Types: Cigars, Cigarettes  Smokeless Tobacco  . Not on file    Comment: Discussed cessation and shared we have classes that he could attend.     Goals Met:  Independence with exercise equipment Exercise tolerated well No report of cardiac concerns or symptoms Strength training completed today  Goals Unmet:  Not Applicable  Comments: Check out 1030.   Dr. Kate Sable is Medical Director for [facility] Cardiac and Pulmonary Rehab.

## 2016-08-03 ENCOUNTER — Encounter (HOSPITAL_COMMUNITY)
Admission: RE | Admit: 2016-08-03 | Discharge: 2016-08-03 | Disposition: A | Discharge status: Home / Self Care | Payer: Medicare Other | Source: Ambulatory Visit | Attending: Cardiology | Admitting: Cardiology

## 2016-08-03 DIAGNOSIS — I214 Non-ST elevation (NSTEMI) myocardial infarction: Secondary | ICD-10-CM

## 2016-08-03 DIAGNOSIS — Z955 Presence of coronary angioplasty implant and graft: Secondary | ICD-10-CM

## 2016-08-03 NOTE — Progress Notes (Signed)
Daily Session Note  Patient Details  Name: Stephen Macdonald MRN: [id number] Date of Birth: 11-Feb-1946 Referring Provider:     [facility] from 07/21/2016 in [place]  Referring Provider  Dr. Janese Banks      Encounter Date: 08/03/2016  Check In:     Session Check In - 08/03/16 0930      Check-In   Location [facility]   Staff Present Aundra Dubin, RN, BSN;Gregory Luther Parody, BS, EP, Exercise Physiologist   Supervising physician immediately available to respond to emergencies See telemetry face sheet for immediately available MD   Medication changes reported     No   Fall or balance concerns reported    No   Warm-up and Cool-down Performed as group-led instruction   Resistance Training Performed Yes   VAD Patient? No     Pain Assessment   Currently in Pain? No/denies   Pain Score 0-No pain   Multiple Pain Sites No      Capillary Blood Glucose: No results found for this or any previous visit (from the past 24 hour(s)).    History  Smoking Status  . Current Some Day Smoker  . Types: Cigars, Cigarettes  Smokeless Tobacco  . Not on file    Comment: Discussed cessation and shared we have classes that he could attend.     Goals Met:  Independence with exercise equipment Exercise tolerated well No report of cardiac concerns or symptoms Strength training completed today  Goals Unmet:  Not Applicable  Comments: Check out 1030.   Dr. Kate Sable is Medical Director for [facility] and [facility] Re[facility].

## 2016-08-05 ENCOUNTER — Encounter (HOSPITAL_COMMUNITY)
Admission: RE | Admit: 2016-08-05 | Discharge: 2016-08-05 | Disposition: A | Discharge status: Home / Self Care | Payer: Medicare Other | Source: Ambulatory Visit | Attending: Cardiology | Admitting: Cardiology

## 2016-08-05 DIAGNOSIS — I214 Non-ST elevation (NSTEMI) myocardial infarction: Secondary | ICD-10-CM

## 2016-08-05 DIAGNOSIS — Z955 Presence of coronary angioplasty implant and graft: Secondary | ICD-10-CM

## 2016-08-05 NOTE — Progress Notes (Signed)
Daily Session Note  Patient Details  Name: Stephen Macdonald MRN: [id number] Date of Birth: 1946/01/19 Referring Provider:     CARDIAC REHAB PHASE II ORIENTATION from 07/21/2016 in [place]  Referring Provider  Dr. Janese Banks      Encounter Date: 08/05/2016  Check In:     Session Check In - 08/05/16 0930      Check-In   Location [facility]   Staff Present Suzanne Boron, BS, EP, Exercise Physiologist;Cassadee Vanzandt Wynetta Emery, RN, BSN   Supervising physician immediately available to respond to emergencies See telemetry face sheet for immediately available MD   Medication changes reported     No   Fall or balance concerns reported    No   Warm-up and Cool-down Performed as group-led instruction   Resistance Training Performed Yes   VAD Patient? No     Pain Assessment   Currently in Pain? No/denies   Pain Score 0-No pain   Multiple Pain Sites No      Capillary Blood Glucose: No results found for this or any previous visit (from the past 24 hour(s)).    History  Smoking Status  . Current Some Day Smoker  . Types: Cigars, Cigarettes  Smokeless Tobacco  . Not on file    Comment: Discussed cessation and shared we have classes that he could attend.     Goals Met:  Independence with exercise equipment Exercise tolerated well No report of cardiac concerns or symptoms Strength training completed today  Goals Unmet:  Not Applicable  Comments: Check out 1030.   Dr. Kate Sable is Medical Director for [facility] Cardiac and Pulmonary Rehab.

## 2016-08-08 ENCOUNTER — Encounter (HOSPITAL_COMMUNITY)
Admission: RE | Admit: 2016-08-08 | Discharge: 2016-08-08 | Disposition: A | Discharge status: Home / Self Care | Payer: Medicare Other | Source: Ambulatory Visit | Attending: Cardiology | Admitting: Cardiology

## 2016-08-08 DIAGNOSIS — I214 Non-ST elevation (NSTEMI) myocardial infarction: Secondary | ICD-10-CM | POA: Diagnosis not present

## 2016-08-08 DIAGNOSIS — Z955 Presence of coronary angioplasty implant and graft: Secondary | ICD-10-CM

## 2016-08-08 NOTE — Progress Notes (Signed)
Daily Session Note  Patient Details  Name: Stephen Macdonald MRN: [id number] Date of Birth: 1945/04/30 Referring Provider:     CARDIAC REHAB PHASE II ORIENTATION from 07/21/2016 in [place]  Referring Provider  Dr. Janese Banks      Encounter Date: 08/08/2016  Check In:     Session Check In - 08/08/16 0930      Check-In   Location [facility]   Staff Present Suzanne Boron, BS, EP, Exercise Physiologist;Varetta Chavers Wynetta Emery, RN, BSN   Supervising physician immediately available to respond to emergencies See telemetry face sheet for immediately available MD   Medication changes reported     No   Fall or balance concerns reported    No   Warm-up and Cool-down Performed as group-led instruction   Resistance Training Performed Yes   VAD Patient? No     Pain Assessment   Currently in Pain? No/denies   Pain Score 0-No pain   Multiple Pain Sites No      Capillary Blood Glucose: No results found for this or any previous visit (from the past 24 hour(s)).    History  Smoking Status  . Current Some Day Smoker  . Types: Cigars, Cigarettes  Smokeless Tobacco  . Not on file    Comment: Discussed cessation and shared we have classes that he could attend.     Goals Met:  Independence with exercise equipment Exercise tolerated well No report of cardiac concerns or symptoms Strength training completed today  Goals Unmet:  Not Applicable  Comments: Check out 1030.   Dr. Kate Sable is Medical Director for [facility] Cardiac and Pulmonary Rehab.

## 2016-08-10 ENCOUNTER — Encounter (HOSPITAL_COMMUNITY)
Admission: RE | Admit: 2016-08-10 | Discharge: 2016-08-10 | Disposition: A | Discharge status: Home / Self Care | Payer: Medicare Other | Source: Ambulatory Visit | Attending: Cardiology | Admitting: Cardiology

## 2016-08-10 DIAGNOSIS — Z955 Presence of coronary angioplasty implant and graft: Secondary | ICD-10-CM

## 2016-08-10 DIAGNOSIS — I214 Non-ST elevation (NSTEMI) myocardial infarction: Secondary | ICD-10-CM | POA: Diagnosis not present

## 2016-08-10 NOTE — Progress Notes (Signed)
Daily Session Note  Patient Details  Name: Stephen Macdonald MRN: [id number] Date of Birth: 18-Dec-1945 Referring Provider:     [place] from 07/21/2016 in [place]  Referring Provider  Dr. Janese Banks      Encounter Date: 08/10/2016  Check In:     Session Check In - 08/10/16 0930      Check-In   Location [facility]   Staff Present Aundra Dubin, RN, BSN;Gregory Luther Parody, BS, EP, Exercise Physiologist;Diane Coad, MS, EP, [facility], Exercise Physiologist   Supervising physician immediately available to respond to emergencies See telemetry face sheet for immediately available MD   Medication changes reported     No   Fall or balance concerns reported    No   Tobacco Cessation No Change   Warm-up and Cool-down Performed as group-led instruction   Resistance Training Performed Yes   VAD Patient? No     Pain Assessment   Currently in Pain? No/denies   Pain Score 0-No pain   Multiple Pain Sites No      Capillary Blood Glucose: No results found for this or any previous visit (from the past 24 hour(s)).    History  Smoking Status  . Current Some Day Smoker  . Types: Cigars, Cigarettes  Smokeless Tobacco  . Not on file    Comment: Discussed cessation and shared we have classes that he could attend.     Goals Met:  Independence with exercise equipment Exercise tolerated well No report of cardiac concerns or symptoms Strength training completed today  Goals Unmet:  Not Applicable  Comments: Check out 1030.   Dr. Kate Sable is Medical Director for [facility] Cardiac and Pulmonary Rehab.

## 2016-08-10 NOTE — Progress Notes (Deleted)
Cardiac Individual Treatment Plan  Patient Details  Name: Stephen Macdonald MRN: [id number] Date of Birth: May 24, 1945 Referring Provider:     CARDIAC REHAB PHASE II ORIENTATION from 07/21/2016 in [place]  Referring Provider  Dr. Janese Banks      Initial Encounter Date:    CARDIAC REHAB PHASE II ORIENTATION from 07/21/2016 in [place]  Date  07/21/16  Referring Provider  Dr. Janese Banks      Visit Diagnosis: NSTEMI (non-ST elevated myocardial infarction) [facility])  Status post coronary artery stent placement  Patient's Home Medications on Admission:  Current Outpatient Prescriptions:  .  aspirin EC 81 MG tablet, Take 81 mg by mouth every morning. , Disp: , Rfl:  .  atorvastatin (LIPITOR) 80 MG tablet, Take 1 tablet (80 mg total) by mouth daily. (Patient taking differently: Take 80 mg by mouth at bedtime. ), Disp: 30 tablet, Rfl: 11 .  cholecalciferol (VITAMIN D) 1000 UNITS tablet, Take 1,000 Units by mouth at bedtime. , Disp: , Rfl:  .  furosemide (LASIX) 40 MG tablet, Take 1 tablet (40 mg total) by mouth daily. (Patient taking differently: Take 40 mg by mouth daily as needed. ), Disp: 30 tablet, Rfl: 3 .  metoprolol succinate (TOPROL-XL) 25 MG 24 hr tablet, Take 25 mg by mouth every morning., Disp: , Rfl:  .  nitroGLYCERIN (NITROSTAT) 0.4 MG SL tablet, Place 1 tablet (0.4 mg total) under the tongue every 5 (five) minutes as needed for chest pain., Disp: 25 tablet, Rfl: 3 .  potassium chloride 20 MEQ TBCR, Take 20 mEq by mouth daily. (Patient taking differently: Take 20 mEq by mouth daily as needed. ), Disp: 30 tablet, Rfl: 3 .  tamsulosin (FLOMAX) 0.4 MG CAPS capsule, Take 0.4 mg by mouth every morning., Disp: , Rfl:  .  ticagrelor (BRILINTA) 90 MG TABS tablet, Take 1 tablet (90 mg total) by mouth 2 (two) times daily., Disp: 60 tablet, Rfl: 6 .  valsartan (DIOVAN) 40 MG tablet, Take 40 mg by mouth at bedtime. , Disp: , Rfl:   Past Medical History: Past  Medical History:  Diagnosis Date  . Chronic combined systolic and diastolic CHF, NYHA class 3 ([place])    a. 01/2015 Echo: EF 35-30%; b. 03/2016 Echo: Ef 20%, Gr1DD, mid-apical anterior, mid anteroseptal, apical inferior, basal inferolateral, mid anterolateral, and apical AK, basal anteroseptal and mid inferior severe HK, midlly dil LA, mild TR.  . CKD (chronic kidney disease), stage III 04/15/2016  . Coronary artery disease    a. 1997 s/p CABG;  b. 01/2015 MI/PCI: G->PDA;  c. 03/2016 NSTEMI/PCI: LM 20, LAD 148m D2 80, RI 60, LCX ok, OM1 90, RCA 60p, 1070mRPAV 80, G->RPDA 15p ISR, 90d (3.5x28 Promus Premier DES), LIMA->OM1->dLAD nl.  . High cholesterol   . Hypertension   . Ischemic cardiomyopathy    a. 01/2015 Echo: EF 25-30%; b. s/p AICD;  c. 03/2016 Echo: Ef 20%, Gr1DD.  . Marland Kitchenmoker     Tobacco Use: History  Smoking Status  . Current Some Day Smoker  . Types: Cigars, Cigarettes  Smokeless Tobacco  . Not on file    Comment: Discussed cessation and shared we have classes that he could attend.     Labs: Recent Review Flowsheet Data    Labs for ITP Cardiac and Pulmonary Rehab Latest Ref Rng & Units 02/12/2015 04/16/2016   Cholestrol 0 - 200 mg/dL - 157   LDLCALC 0 - 99 mg/dL - 95   HDL >40  mg/dL - 43   Trlycerides <235 mg/dL - 96   Hemoglobin T6R 4.8 - 5.6 % - 5.6   TCO2 0 - 100 mmol/L 18 -      Capillary Blood Glucose: No results found for: GLUCAP   Exercise Target Goals:    Exercise Program Goal: Individual exercise prescription set with THRR, safety & activity barriers. Participant demonstrates ability to understand and report RPE using BORG scale, to self-measure pulse accurately, and to acknowledge the importance of the exercise prescription.  Exercise Prescription Goal: Starting with aerobic activity 30 plus minutes a day, 3 days per week for initial exercise prescription. Provide home exercise prescription and guidelines that participant acknowledges understanding  prior to discharge.  Activity Barriers & Risk Stratification:   6 Minute Walk:     6 Minute Walk    Row Name 07/21/16 0959         6 Minute Walk   Phase Initial     Distance 1700 feet     Distance % Change 0 %     Walk Time 6 minutes     # of Rest Breaks 0     MPH 3.21     METS 3.46     RPE 9     Perceived Dyspnea  9     VO2 Peak 11.54     Symptoms No     Resting HR 76 bpm     Resting BP 112/70     Max Ex. HR 100 bpm     Max Ex. BP 124/70     2 Minute Post BP 110/68        Oxygen Initial Assessment:   Oxygen Re-Evaluation:   Oxygen Discharge (Final Oxygen Re-Evaluation):   Initial Exercise Prescription:     Initial Exercise Prescription - 07/21/16 1000      Date of Initial Exercise RX and Referring Provider   Date 07/21/16   Referring Provider Dr. Smith Robert     Treadmill   MPH 2.5   Grade 0   Minutes 20   METs 2.9     NuStep   Level 2   SPM 25   Minutes 15   METs 2     Prescription Details   Frequency (times per week) 3   Duration Progress to 30 minutes of continuous aerobic without signs/symptoms of physical distress     Intensity   THRR 40-80% of Max Heartrate 105-120-134   Ratings of Perceived Exertion 11-13   Perceived Dyspnea 0-4     Progression   Progression Continue progressive overload as per policy without signs/symptoms or physical distress.     Resistance Training   Training Prescription Yes   Weight 1   Reps 10-15      Perform Capillary Blood Glucose checks as needed.  Exercise Prescription Changes:      Exercise Prescription Changes    Row Name 08/08/16 1400             Response to Exercise   Blood Pressure (Admit) 110/70       Blood Pressure (Exercise) 100/60       Blood Pressure (Exit) 108/60       Heart Rate (Admit) 57 bpm       Heart Rate (Exercise) 116 bpm       Heart Rate (Exit) 66 bpm       Rating of Perceived Exertion (Exercise) 9       Duration Progress to 30 minutes of  aerobic without  signs/symptoms of physical distress       Intensity THRR unchanged         Progression   Progression Continue to progress workloads to maintain intensity without signs/symptoms of physical distress.         Resistance Training   Training Prescription Yes       Weight 2       Reps 10-15         Treadmill   MPH 2.7       Grade 0       Minutes 15       METs 3         NuStep   Level 3       SPM 84       Minutes 20       METs 3.7         Home Exercise Plan   Plans to continue exercise at Home (comment)       Frequency Add 2 additional days to program exercise sessions.          Exercise Comments:      Exercise Comments    Row Name 08/08/16 1420           Exercise Comments Patient is doing very well in CR.           Exercise Goals and Review:      Exercise Goals    Row Name 07/21/16 1134             Exercise Goals   Increase Physical Activity Yes       Intervention Provide advice, education, support and counseling about physical activity/exercise needs.;Develop an individualized exercise prescription for aerobic and resistive training based on initial evaluation findings, risk stratification, comorbidities and participant's personal goals.       Expected Outcomes Achievement of increased cardiorespiratory fitness and enhanced flexibility, muscular endurance and strength shown through measurements of functional capacity and personal statement of participant.       Increase Strength and Stamina Yes       Intervention Provide advice, education, support and counseling about physical activity/exercise needs.;Develop an individualized exercise prescription for aerobic and resistive training based on initial evaluation findings, risk stratification, comorbidities and participant's personal goals.       Expected Outcomes Achievement of increased cardiorespiratory fitness and enhanced flexibility, muscular endurance and strength shown through measurements of functional  capacity and personal statement of participant.          Exercise Goals Re-Evaluation :     Exercise Goals Re-Evaluation    Row Name 08/10/16 1519             Exercise Goal Re-Evaluation   Exercise Goals Review Increase Physical Activity;Increase Strenth and Stamina  Walk further; do more around the house.        Comments After completing 31 sessions, patient is doing more around the house. His strength and stamina have increased but his attendance has been inconsistent impeding his progress.        Expected Outcomes Patient will complete the program with increased strength, stamina, and activity.           Discharge Exercise Prescription (Final Exercise Prescription Changes):     Exercise Prescription Changes - 08/08/16 1400      Response to Exercise   Blood Pressure (Admit) 110/70   Blood Pressure (Exercise) 100/60   Blood Pressure (Exit) 108/60   Heart Rate (Admit) 57 bpm   Heart Rate (Exercise) 116  bpm   Heart Rate (Exit) 66 bpm   Rating of Perceived Exertion (Exercise) 9   Duration Progress to 30 minutes of  aerobic without signs/symptoms of physical distress   Intensity THRR unchanged     Progression   Progression Continue to progress workloads to maintain intensity without signs/symptoms of physical distress.     Resistance Training   Training Prescription Yes   Weight 2   Reps 10-15     Treadmill   MPH 2.7   Grade 0   Minutes 15   METs 3     NuStep   Level 3   SPM 84   Minutes 20   METs 3.7     Home Exercise Plan   Plans to continue exercise at Home (comment)   Frequency Add 2 additional days to program exercise sessions.      Nutrition:  Target Goals: Understanding of nutrition guidelines, daily intake of sodium 1500mg , cholesterol 200mg , calories 30% from fat and 7% or less from saturated fats, daily to have 5 or more servings of fruits and vegetables.  Biometrics:     Pre Biometrics - 07/21/16 1002      Pre Biometrics   Height 6'  1" (1.854 m)   Weight 239 lb 13.8 oz (108.8 kg)   Waist Circumference 41 inches   Hip Circumference 40.5 inches   Waist to Hip Ratio 1.01 %   BMI (Calculated) 31.7   Triceps Skinfold 8 mm   % Body Fat 26.5 %   Grip Strength 92.67 kg   Flexibility 10 in   Single Leg Stand 14 seconds       Nutrition Therapy Plan and Nutrition Goals:   Nutrition Discharge: Rate Your Plate Scores:   Nutrition Goals Re-Evaluation:   Nutrition Goals Discharge (Final Nutrition Goals Re-Evaluation):   Psychosocial: Target Goals: Acknowledge presence or absence of significant depression and/or stress, maximize coping skills, provide positive support system. Participant is able to verbalize types and ability to use techniques and skills needed for reducing stress and depression.  Initial Review & Psychosocial Screening:     Initial Psych Review & Screening - 07/21/16 1141      Initial Review   Current issues with Current Depression     Family Dynamics   Good Support System? Yes     Barriers   Psychosocial barriers to participate in program Psychosocial barriers identified (see note)  He is a little down because of his recent heart problems. He says he is feeling better.      Screening Interventions   Interventions Encouraged to exercise      Quality of Life Scores:     Quality of Life - 07/21/16 1002      Quality of Life Scores   Health/Function Pre 16.23 %   Socioeconomic Pre 24.75 %   Psych/Spiritual Pre 21.14 %   Family Pre 28.8 %   GLOBAL Pre 20.73 %      PHQ-9: Recent Review Flowsheet Data    Depression screen [facility] 2/9 07/21/2016 11/05/2015 06/16/2015   Decreased Interest 1 1 1    Down, Depressed, Hopeless 0 0 1   PHQ - 2 Score 1 1 2    Altered sleeping 0 0 0   Tired, decreased energy 2 1 0   Change in appetite 0 0 0   Feeling bad or failure about yourself  0 0 0   Trouble concentrating 0 0 0   Moving slowly or fidgety/restless 0 0 0   Suicidal  thoughts 0 0 0   PHQ-9  Score 3 2 2    Difficult doing work/chores Somewhat difficult Somewhat difficult Somewhat difficult     Interpretation of Total Score  Total Score Depression Severity:  1-4 = Minimal depression, 5-9 = Mild depression, 10-14 = Moderate depression, 15-19 = Moderately severe depression, 20-27 = Severe depression   Psychosocial Evaluation and Intervention:     Psychosocial Evaluation - 07/21/16 1142      Psychosocial Evaluation & Interventions   Interventions Encouraged to exercise with the program and follow exercise prescription   Comments 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 Just a little depression.  His current health issues has him stressed. Involved in community game like dominoes. He also plays the guitar.    Continue Psychosocial Services  No Follow up required      Psychosocial Re-Evaluation:     Psychosocial Re-Evaluation    Row Name 07/21/16 1144 08/10/16 1521           Psychosocial Re-Evaluation   Current issues with Current Depression Current Depression      Expected Outcomes  - Patient will have improved QOL and PHQ-9 scores at discharge.       Interventions Encouraged to attend Pulmonary Rehabilitation for the exercise Encouraged to attend Cardiac Rehabilitation for the exercise  Patient on anti-depressant to treat depression.      Continue Psychosocial Services  No Follow up required No Follow up required         Psychosocial Discharge (Final Psychosocial Re-Evaluation):     Psychosocial Re-Evaluation - 08/10/16 1521      Psychosocial Re-Evaluation   Current issues with Current Depression   Expected Outcomes Patient will have improved QOL and PHQ-9 scores at discharge.    Interventions Encouraged to attend Cardiac Rehabilitation for the exercise  Patient on anti-depressant to treat depression.   Continue Psychosocial Services  No Follow up required      Vocational Rehabilitation: Provide vocational rehab assistance to qualifying candidates.    Vocational Rehab Evaluation & Intervention:     Vocational Rehab - 07/21/16 1130      Initial Vocational Rehab Evaluation & Intervention   Assessment shows need for Vocational Rehabilitation No      Education: Education Goals: Education classes will be provided on a weekly basis, covering required topics. Participant will state understanding/return demonstration of topics presented.  Learning Barriers/Preferences:     Learning Barriers/Preferences - 07/21/16 1129      Learning Barriers/Preferences   Learning Barriers None   Learning Preferences Skilled Demonstration;Individual Instruction;Group Instruction      Education Topics: Hypertension, Hypertension Reduction -Define heart disease and high blood pressure. Discus how high blood pressure affects the body and ways to reduce high blood pressure.   CARDIAC REHAB PHASE II EXERCISE from 09/16/2015 in [place] CARDIAC REHABILITATION  Date  08/19/15  Educator  Hart Rochester  Instruction Review Code  2- meets goals/outcomes      Exercise and Your Heart -Discuss why it is important to exercise, the FITT principles of exercise, normal and abnormal responses to exercise, and how to exercise safely.   CARDIAC REHAB PHASE II EXERCISE from 09/16/2015 in [place] CARDIAC REHABILITATION  Date  08/26/15  Educator  Hart Rochester  Instruction Review Code  2- meets goals/outcomes      Angina -Discuss definition of angina, causes of angina, treatment of angina, and how to decrease risk of having angina.   CARDIAC REHAB PHASE II EXERCISE from 09/16/2015 in [facility] CARDIAC  REHABILITATION  Date  09/02/15  Educator  DCoad  Instruction Review Code  2- meets goals/outcomes      Cardiac Medications -Review what the following cardiac medications are used for, how they affect the body, and side effects that may occur when taking the medications.  Medications include Aspirin, Beta blockers, calcium channel blockers, ACE Inhibitors,  angiotensin receptor blockers, diuretics, digoxin, and antihyperlipidemics.   Congestive Heart Failure -Discuss the definition of CHF, how to live with CHF, the signs and symptoms of CHF, and how keep track of weight and sodium intake.   CARDIAC REHAB PHASE II EXERCISE from 09/16/2015 in [place] [facility] CARDIAC REHABILITATION  Date  09/16/15  Educator  DC  Instruction Review Code  2- meets goals/outcomes      Heart Disease and Intimacy -Discus the effect sexual activity has on the heart, how changes occur during intimacy as we age, and safety during sexual activity.   CARDIAC REHAB PHASE II EXERCISE from 09/16/2015 in [place] CARDIAC REHABILITATION  Date  06/24/15  Educator  Hart Rochester  Instruction Review Code  2- meets goals/outcomes      Smoking Cessation / COPD -Discuss different methods to quit smoking, the health benefits of quitting smoking, and the definition of COPD.   CARDIAC REHAB PHASE II EXERCISE from 09/16/2015 in [place] CARDIAC REHABILITATION  Date  07/01/15  Educator  Hart Rochester  Instruction Review Code  2- meets goals/outcomes      Nutrition I: Fats -Discuss the types of cholesterol, what cholesterol does to the heart, and how cholesterol levels can be controlled.   CARDIAC REHAB PHASE II EXERCISE from 09/16/2015 in [place] CARDIAC REHABILITATION  Date  07/08/15  Educator  Hart Rochester  Instruction Review Code  2- meets goals/outcomes      Nutrition II: Labels -Discuss the different components of food labels and how to read food label   CARDIAC REHAB PHASE II EXERCISE from 09/16/2015 in [place] CARDIAC REHABILITATION  Date  07/15/15  Educator  Hart Rochester  Instruction Review Code  2- meets goals/outcomes      Heart Parts and Heart Disease -Discuss the anatomy of the heart, the pathway of blood circulation through the heart, and these are affected by heart disease.   CARDIAC REHAB PHASE II EXERCISE from 09/16/2015 in [place] CARDIAC REHABILITATION   Date  07/22/15  Educator  Hart Rochester  Instruction Review Code  2- meets goals/outcomes      Stress I: Signs and Symptoms -Discuss the causes of stress, how stress may lead to anxiety and depression, and ways to limit stress.   CARDIAC REHAB PHASE II EXERCISE from 09/16/2015 in [place] CARDIAC REHABILITATION  Date  07/29/15  Educator  Hart Rochester  Instruction Review Code  2- meets goals/outcomes      Stress II: Relaxation -Discuss different types of relaxation techniques to limit stress.   CARDIAC REHAB PHASE II EXERCISE from 09/16/2015 in [place] CARDIAC REHABILITATION  Date  08/05/15  Educator  Hart Rochester  Instruction Review Code  2- meets goals/outcomes      Warning Signs of Stroke / TIA -Discuss definition of a stroke, what the signs and symptoms are of a stroke, and how to identify when someone is having stroke.   CARDIAC REHAB PHASE II EXERCISE from 09/16/2015 in [place] CARDIAC REHABILITATION  Date  08/12/15  Educator  Hart Rochester  Instruction Review Code  2- meets goals/outcomes      Knowledge Questionnaire Score:  Knowledge Questionnaire Score - 07/21/16 1130      Knowledge Questionnaire Score   Pre Score 27/28      Core Components/Risk Factors/Patient Goals at Admission:     Personal Goals and Risk Factors at Admission - 07/21/16 1135      Core Components/Risk Factors/Patient Goals on Admission    Weight Management Yes   Intervention Weight Management/Obesity: Establish reasonable short term and long term weight goals.   Admit Weight 240 lb (108.9 kg)   Goal Weight: Short Term 235 lb (106.6 kg)   Goal Weight: Long Term 230 lb (104.3 kg)   Expected Outcomes Short Term: Continue to assess and modify interventions until short term weight is achieved;Long Term: Adherence to nutrition and physical activity/exercise program aimed toward attainment of established weight goal   Tobacco Cessation Yes   Number of packs per day --  he has cut down to 1  cigar/day   Intervention Assist the participant in steps to quit. Provide individualized education and counseling about committing to Tobacco Cessation, relapse prevention, and pharmacological support that can be provided by physician.   Expected Outcomes Short Term: Will demonstrate readiness to quit, by selecting a quit date.;Long Term: Complete abstinence from all tobacco products for at least 12 months from quit date.   Lipids --  They are now under control   Personal Goal Other Yes   Personal Goal Regain his funcational capacity. Lose 10 lbs.    Intervention Attend CR 3xweek and supplement exercise at home 2 x week.    Expected Outcomes Achieve personal goals.       Core Components/Risk Factors/Patient Goals Review:      Goals and Risk Factor Review    Row Name 07/21/16 1140 08/10/16 1518           Core Components/Risk Factors/Patient Goals Review   Personal Goals Review Weight Management/Obesity Weight Management/Obesity      Review  - Patient has completed 31 sessions gaining 2.2 lbs. His attendance has been inconsistent.       Expected Outcomes  - Patient will complete the program meeting his personal goals.          Core Components/Risk Factors/Patient Goals at Discharge (Final Review):      Goals and Risk Factor Review - 08/10/16 1518      Core Components/Risk Factors/Patient Goals Review   Personal Goals Review Weight Management/Obesity   Review Patient has completed 31 sessions gaining 2.2 lbs. His attendance has been inconsistent.    Expected Outcomes Patient will complete the program meeting his personal goals.       ITP Comments:     ITP Comments    Row Name 07/21/16 1419           ITP Comments Patient new to program. Plans to start Monday 07/25/16.          Comments: ITP 30 Day REVIEW Patient doing well in the program. Will continue to monitor for progress.

## 2016-08-10 NOTE — Progress Notes (Signed)
Cardiac Individual Treatment Plan  Patient Details  Name: Stephen Macdonald MRN: [id number] Date of Birth: May 24, 1945 Referring Provider:     CARDIAC REHAB PHASE II ORIENTATION from 07/21/2016 in [place]  Referring Provider  Dr. Janese Banks      Initial Encounter Date:    CARDIAC REHAB PHASE II ORIENTATION from 07/21/2016 in [place]  Date  07/21/16  Referring Provider  Dr. Janese Banks      Visit Diagnosis: NSTEMI (non-ST elevated myocardial infarction) [facility])  Status post coronary artery stent placement  Patient's Home Medications on Admission:  Current Outpatient Prescriptions:  .  aspirin EC 81 MG tablet, Take 81 mg by mouth every morning. , Disp: , Rfl:  .  atorvastatin (LIPITOR) 80 MG tablet, Take 1 tablet (80 mg total) by mouth daily. (Patient taking differently: Take 80 mg by mouth at bedtime. ), Disp: 30 tablet, Rfl: 11 .  cholecalciferol (VITAMIN D) 1000 UNITS tablet, Take 1,000 Units by mouth at bedtime. , Disp: , Rfl:  .  furosemide (LASIX) 40 MG tablet, Take 1 tablet (40 mg total) by mouth daily. (Patient taking differently: Take 40 mg by mouth daily as needed. ), Disp: 30 tablet, Rfl: 3 .  metoprolol succinate (TOPROL-XL) 25 MG 24 hr tablet, Take 25 mg by mouth every morning., Disp: , Rfl:  .  nitroGLYCERIN (NITROSTAT) 0.4 MG SL tablet, Place 1 tablet (0.4 mg total) under the tongue every 5 (five) minutes as needed for chest pain., Disp: 25 tablet, Rfl: 3 .  potassium chloride 20 MEQ TBCR, Take 20 mEq by mouth daily. (Patient taking differently: Take 20 mEq by mouth daily as needed. ), Disp: 30 tablet, Rfl: 3 .  tamsulosin (FLOMAX) 0.4 MG CAPS capsule, Take 0.4 mg by mouth every morning., Disp: , Rfl:  .  ticagrelor (BRILINTA) 90 MG TABS tablet, Take 1 tablet (90 mg total) by mouth 2 (two) times daily., Disp: 60 tablet, Rfl: 6 .  valsartan (DIOVAN) 40 MG tablet, Take 40 mg by mouth at bedtime. , Disp: , Rfl:   Past Medical History: Past  Medical History:  Diagnosis Date  . Chronic combined systolic and diastolic CHF, NYHA class 3 ([place])    a. 01/2015 Echo: EF 35-30%; b. 03/2016 Echo: Ef 20%, Gr1DD, mid-apical anterior, mid anteroseptal, apical inferior, basal inferolateral, mid anterolateral, and apical AK, basal anteroseptal and mid inferior severe HK, midlly dil LA, mild TR.  . CKD (chronic kidney disease), stage III 04/15/2016  . Coronary artery disease    a. 1997 s/p CABG;  b. 01/2015 MI/PCI: G->PDA;  c. 03/2016 NSTEMI/PCI: LM 20, LAD 148m D2 80, RI 60, LCX ok, OM1 90, RCA 60p, 1070mRPAV 80, G->RPDA 15p ISR, 90d (3.5x28 Promus Premier DES), LIMA->OM1->dLAD nl.  . High cholesterol   . Hypertension   . Ischemic cardiomyopathy    a. 01/2015 Echo: EF 25-30%; b. s/p AICD;  c. 03/2016 Echo: Ef 20%, Gr1DD.  . Marland Kitchenmoker     Tobacco Use: History  Smoking Status  . Current Some Day Smoker  . Types: Cigars, Cigarettes  Smokeless Tobacco  . Not on file    Comment: Discussed cessation and shared we have classes that he could attend.     Labs: Recent Review Flowsheet Data    Labs for ITP Cardiac and Pulmonary Rehab Latest Ref Rng & Units 02/12/2015 04/16/2016   Cholestrol 0 - 200 mg/dL - 157   LDLCALC 0 - 99 mg/dL - 95   HDL >40  mg/dL - 43   Trlycerides <859 mg/dL - 96   Hemoglobin M9P 4.8 - 5.6 % - 5.6   TCO2 0 - 100 mmol/L 18 -      Capillary Blood Glucose: No results found for: GLUCAP   Exercise Target Goals:    Exercise Program Goal: Individual exercise prescription set with THRR, safety & activity barriers. Participant demonstrates ability to understand and report RPE using BORG scale, to self-measure pulse accurately, and to acknowledge the importance of the exercise prescription.  Exercise Prescription Goal: Starting with aerobic activity 30 plus minutes a day, 3 days per week for initial exercise prescription. Provide home exercise prescription and guidelines that participant acknowledges understanding  prior to discharge.  Activity Barriers & Risk Stratification:   6 Minute Walk:     6 Minute Walk    Row Name 07/21/16 0959         6 Minute Walk   Phase Initial     Distance 1700 feet     Distance % Change 0 %     Walk Time 6 minutes     # of Rest Breaks 0     MPH 3.21     METS 3.46     RPE 9     Perceived Dyspnea  9     VO2 Peak 11.54     Symptoms No     Resting HR 76 bpm     Resting BP 112/70     Max Ex. HR 100 bpm     Max Ex. BP 124/70     2 Minute Post BP 110/68        Oxygen Initial Assessment:   Oxygen Re-Evaluation:   Oxygen Discharge (Final Oxygen Re-Evaluation):   Initial Exercise Prescription:     Initial Exercise Prescription - 07/21/16 1000      Date of Initial Exercise RX and Referring Provider   Date 07/21/16   Referring Provider Dr. Smith Robert     Treadmill   MPH 2.5   Grade 0   Minutes 20   METs 2.9     NuStep   Level 2   SPM 25   Minutes 15   METs 2     Prescription Details   Frequency (times per week) 3   Duration Progress to 30 minutes of continuous aerobic without signs/symptoms of physical distress     Intensity   THRR 40-80% of Max Heartrate 105-120-134   Ratings of Perceived Exertion 11-13   Perceived Dyspnea 0-4     Progression   Progression Continue progressive overload as per policy without signs/symptoms or physical distress.     Resistance Training   Training Prescription Yes   Weight 1   Reps 10-15      Perform Capillary Blood Glucose checks as needed.  Exercise Prescription Changes:      Exercise Prescription Changes    Row Name 08/08/16 1400             Response to Exercise   Blood Pressure (Admit) 110/70       Blood Pressure (Exercise) 100/60       Blood Pressure (Exit) 108/60       Heart Rate (Admit) 57 bpm       Heart Rate (Exercise) 116 bpm       Heart Rate (Exit) 66 bpm       Rating of Perceived Exertion (Exercise) 9       Duration Progress to 30 minutes of  aerobic without  signs/symptoms of physical distress       Intensity THRR unchanged         Progression   Progression Continue to progress workloads to maintain intensity without signs/symptoms of physical distress.         Resistance Training   Training Prescription Yes       Weight 2       Reps 10-15         Treadmill   MPH 2.7       Grade 0       Minutes 15       METs 3         NuStep   Level 3       SPM 84       Minutes 20       METs 3.7         Home Exercise Plan   Plans to continue exercise at Home (comment)       Frequency Add 2 additional days to program exercise sessions.          Exercise Comments:      Exercise Comments    Row Name 08/08/16 1420           Exercise Comments Patient is doing very well in CR.           Exercise Goals and Review:      Exercise Goals    Row Name 07/21/16 1134             Exercise Goals   Increase Physical Activity Yes       Intervention Provide advice, education, support and counseling about physical activity/exercise needs.;Develop an individualized exercise prescription for aerobic and resistive training based on initial evaluation findings, risk stratification, comorbidities and participant's personal goals.       Expected Outcomes Achievement of increased cardiorespiratory fitness and enhanced flexibility, muscular endurance and strength shown through measurements of functional capacity and personal statement of participant.       Increase Strength and Stamina Yes       Intervention Provide advice, education, support and counseling about physical activity/exercise needs.;Develop an individualized exercise prescription for aerobic and resistive training based on initial evaluation findings, risk stratification, comorbidities and participant's personal goals.       Expected Outcomes Achievement of increased cardiorespiratory fitness and enhanced flexibility, muscular endurance and strength shown through measurements of functional  capacity and personal statement of participant.          Exercise Goals Re-Evaluation :     Exercise Goals Re-Evaluation    Row Name 08/10/16 1519             Exercise Goal Re-Evaluation   Exercise Goals Review Increase Physical Activity;Increase Strenth and Stamina  Get functional again.         Comments Patient has completed 9 sessions. He has progress well and says he has more energy to do things around the house.        Expected Outcomes Patient will complete the program with increased strength, stamina, and activity.           Discharge Exercise Prescription (Final Exercise Prescription Changes):     Exercise Prescription Changes - 08/08/16 1400      Response to Exercise   Blood Pressure (Admit) 110/70   Blood Pressure (Exercise) 100/60   Blood Pressure (Exit) 108/60   Heart Rate (Admit) 57 bpm   Heart Rate (Exercise) 116 bpm   Heart Rate (Exit) 66 bpm  Rating of Perceived Exertion (Exercise) 9   Duration Progress to 30 minutes of  aerobic without signs/symptoms of physical distress   Intensity THRR unchanged     Progression   Progression Continue to progress workloads to maintain intensity without signs/symptoms of physical distress.     Resistance Training   Training Prescription Yes   Weight 2   Reps 10-15     Treadmill   MPH 2.7   Grade 0   Minutes 15   METs 3     NuStep   Level 3   SPM 84   Minutes 20   METs 3.7     Home Exercise Plan   Plans to continue exercise at Home (comment)   Frequency Add 2 additional days to program exercise sessions.      Nutrition:  Target Goals: Understanding of nutrition guidelines, daily intake of sodium 1500mg , cholesterol 200mg , calories 30% from fat and 7% or less from saturated fats, daily to have 5 or more servings of fruits and vegetables.  Biometrics:     Pre Biometrics - 07/21/16 1002      Pre Biometrics   Height 6\' 1"  (1.854 m)   Weight 239 lb 13.8 oz (108.8 kg)   Waist Circumference 41  inches   Hip Circumference 40.5 inches   Waist to Hip Ratio 1.01 %   BMI (Calculated) 31.7   Triceps Skinfold 8 mm   % Body Fat 26.5 %   Grip Strength 92.67 kg   Flexibility 10 in   Single Leg Stand 14 seconds       Nutrition Therapy Plan and Nutrition Goals:   Nutrition Discharge: Rate Your Plate Scores:   Nutrition Goals Re-Evaluation:   Nutrition Goals Discharge (Final Nutrition Goals Re-Evaluation):   Psychosocial: Target Goals: Acknowledge presence or absence of significant depression and/or stress, maximize coping skills, provide positive support system. Participant is able to verbalize types and ability to use techniques and skills needed for reducing stress and depression.  Initial Review & Psychosocial Screening:     Initial Psych Review & Screening - 07/21/16 1141      Initial Review   Current issues with Current Depression     Family Dynamics   Good Support System? Yes     Barriers   Psychosocial barriers to participate in program Psychosocial barriers identified (see note)  He is a little down because of his recent heart problems. He says he is feeling better.      Screening Interventions   Interventions Encouraged to exercise      Quality of Life Scores:     Quality of Life - 07/21/16 1002      Quality of Life Scores   Health/Function Pre 16.23 %   Socioeconomic Pre 24.75 %   Psych/Spiritual Pre 21.14 %   Family Pre 28.8 %   GLOBAL Pre 20.73 %      PHQ-9: Recent Review Flowsheet Data    Depression screen [facility] 2/9 07/21/2016 11/05/2015 06/16/2015   Decreased Interest 1 1 1    Down, Depressed, Hopeless 0 0 1   PHQ - 2 Score 1 1 2    Altered sleeping 0 0 0   Tired, decreased energy 2 1 0   Change in appetite 0 0 0   Feeling bad or failure about yourself  0 0 0   Trouble concentrating 0 0 0   Moving slowly or fidgety/restless 0 0 0   Suicidal thoughts 0 0 0   PHQ-9 Score 3 2  2   Difficult doing work/chores Somewhat difficult Somewhat  difficult Somewhat difficult     Interpretation of Total Score  Total Score Depression Severity:  1-4 = Minimal depression, 5-9 = Mild depression, 10-14 = Moderate depression, 15-19 = Moderately severe depression, 20-27 = Severe depression   Psychosocial Evaluation and Intervention:     Psychosocial Evaluation - 07/21/16 1142      Psychosocial Evaluation & Interventions   Interventions Encouraged to exercise with the program and follow exercise prescription   Comments Patient retired [employer] with grown children. Just a little depression.  His current health issues has him stressed. Involved in community game like dominoes. He also plays the guitar.    Continue Psychosocial Services  No Follow up required      Psychosocial Re-Evaluation:     Psychosocial Re-Evaluation    Row Name 07/21/16 1144 08/10/16 1521           Psychosocial Re-Evaluation   Current issues with Current Depression None Identified      Expected Outcomes  - Patient will have improved QOL and PHQ-9 scores at discharge.       Interventions Encouraged to attend Pulmonary Rehabilitation for the exercise Encouraged to attend Cardiac Rehabilitation for the exercise  .      Continue Psychosocial Services  No Follow up required No Follow up required         Psychosocial Discharge (Final Psychosocial Re-Evaluation):     Psychosocial Re-Evaluation - 08/10/16 1521      Psychosocial Re-Evaluation   Current issues with None Identified   Expected Outcomes Patient will have improved QOL and PHQ-9 scores at discharge.    Interventions Encouraged to attend Cardiac Rehabilitation for the exercise  .   Continue Psychosocial Services  No Follow up required      Vocational Rehabilitation: Provide vocational rehab assistance to qualifying candidates.   Vocational Rehab Evaluation & Intervention:     Vocational Rehab - 07/21/16 1130      Initial Vocational Rehab Evaluation & Intervention   Assessment shows need  for Vocational Rehabilitation No      Education: Education Goals: Education classes will be provided on a weekly basis, covering required topics. Participant will state understanding/return demonstration of topics presented.  Learning Barriers/Preferences:     Learning Barriers/Preferences - 07/21/16 1129      Learning Barriers/Preferences   Learning Barriers None   Learning Preferences Skilled Demonstration;Individual Instruction;Group Instruction      Education Topics: Hypertension, Hypertension Reduction -Define heart disease and high blood pressure. Discus how high blood pressure affects the body and ways to reduce high blood pressure.   CARDIAC REHAB PHASE II EXERCISE from 09/16/2015 in [place] CARDIAC REHABILITATION  Date  08/19/15  Educator  Hart Rochester  Instruction Review Code  2- meets goals/outcomes      Exercise and Your Heart -Discuss why it is important to exercise, the FITT principles of exercise, normal and abnormal responses to exercise, and how to exercise safely.   CARDIAC REHAB PHASE II EXERCISE from 09/16/2015 in [place] CARDIAC REHABILITATION  Date  08/26/15  Educator  Hart Rochester  Instruction Review Code  2- meets goals/outcomes      Angina -Discuss definition of angina, causes of angina, treatment of angina, and how to decrease risk of having angina.   CARDIAC REHAB PHASE II EXERCISE from 09/16/2015 in [place] CARDIAC REHABILITATION  Date  09/02/15  Educator  [facility]  Instruction Review Code  2- meets goals/outcomes  Cardiac Medications -Review what the following cardiac medications are used for, how they affect the body, and side effects that may occur when taking the medications.  Medications include Aspirin, Beta blockers, calcium channel blockers, ACE Inhibitors, angiotensin receptor blockers, diuretics, digoxin, and antihyperlipidemics.   Congestive Heart Failure -Discuss the definition of CHF, how to live with CHF, the signs and  symptoms of CHF, and how keep track of weight and sodium intake.   CARDIAC REHAB PHASE II EXERCISE from 09/16/2015 in [place] [facility]IAC REHABILITATION  Date  09/16/15  Educator  [facility]  Instruction Review Code  2- meets goals/outcomes      Heart Disease and Intimacy -Discus the effect sexual activity has on the heart, how changes occur during intimacy as we age, and safety during sexual activity.   CARDIAC REHAB PHASE II EXERCISE from 09/16/2015 in [place] CARDIAC REHABILITATION  Date  06/24/15  Educator  Hart Rochester  Instruction Review Code  2- meets goals/outcomes      Smoking Cessation / COPD -Discuss different methods to quit smoking, the health benefits of quitting smoking, and the definition of COPD.   CARDIAC REHAB PHASE II EXERCISE from 09/16/2015 in [place] CARDIAC REHABILITATION  Date  07/01/15  Educator  Hart Rochester  Instruction Review Code  2- meets goals/outcomes      Nutrition I: Fats -Discuss the types of cholesterol, what cholesterol does to the heart, and how cholesterol levels can be controlled.   CARDIAC REHAB PHASE II EXERCISE from 09/16/2015 in [place] CARDIAC REHABILITATION  Date  07/08/15  Educator  Hart Rochester  Instruction Review Code  2- meets goals/outcomes      Nutrition II: Labels -Discuss the different components of food labels and how to read food label   CARDIAC REHAB PHASE II EXERCISE from 09/16/2015 in [place] CARDIAC REHABILITATION  Date  07/15/15  Educator  Hart Rochester  Instruction Review Code  2- meets goals/outcomes      Heart Parts and Heart Disease -Discuss the anatomy of the heart, the pathway of blood circulation through the heart, and these are affected by heart disease.   CARDIAC REHAB PHASE II EXERCISE from 09/16/2015 in [place] CARDIAC REHABILITATION  Date  07/22/15  Educator  Hart Rochester  Instruction Review Code  2- meets goals/outcomes      Stress I: Signs and Symptoms -Discuss the causes of stress, how stress may  lead to anxiety and depression, and ways to limit stress.   CARDIAC REHAB PHASE II EXERCISE from 09/16/2015 in [place] CARDIAC REHABILITATION  Date  07/29/15  Educator  Hart Rochester  Instruction Review Code  2- meets goals/outcomes      Stress II: Relaxation -Discuss different types of relaxation techniques to limit stress.   CARDIAC REHAB PHASE II EXERCISE from 09/16/2015 in [place] CARDIAC REHABILITATION  Date  08/05/15  Educator  Hart Rochester  Instruction Review Code  2- meets goals/outcomes      Warning Signs of Stroke / TIA -Discuss definition of a stroke, what the signs and symptoms are of a stroke, and how to identify when someone is having stroke.   CARDIAC REHAB PHASE II EXERCISE from 09/16/2015 in [place] CARDIAC REHABILITATION  Date  08/12/15  Educator  Hart Rochester  Instruction Review Code  2- meets goals/outcomes      Knowledge Questionnaire Score:     Knowledge Questionnaire Score - 07/21/16 1130      Knowledge Questionnaire Score   Pre Score 27/28  Core Components/Risk Factors/Patient Goals at Admission:     Personal Goals and Risk Factors at Admission - 07/21/16 1135      Core Components/Risk Factors/Patient Goals on Admission    Weight Management Yes   Intervention Weight Management/Obesity: Establish reasonable short term and long term weight goals.   Admit Weight 240 lb (108.9 kg)   Goal Weight: Short Term 235 lb (106.6 kg)   Goal Weight: Long Term 230 lb (104.3 kg)   Expected Outcomes Short Term: Continue to assess and modify interventions until short term weight is achieved;Long Term: Adherence to nutrition and physical activity/exercise program aimed toward attainment of established weight goal   Tobacco Cessation Yes   Number of packs per day --  he has cut down to 1 cigar/day   Intervention Assist the participant in steps to quit. Provide individualized education and counseling about committing to Tobacco Cessation, relapse prevention,  and pharmacological support that can be provided by physician.   Expected Outcomes Short Term: Will demonstrate readiness to quit, by selecting a quit date.;Long Term: Complete abstinence from all tobacco products for at least 12 months from quit date.   Lipids --  They are now under control   Personal Goal Other Yes   Personal Goal Regain his funcational capacity. Lose 10 lbs.    Intervention Attend CR 3xweek and supplement exercise at home 2 x week.    Expected Outcomes Achieve personal goals.       Core Components/Risk Factors/Patient Goals Review:      Goals and Risk Factor Review    Row Name 07/21/16 1140 08/10/16 1518           Core Components/Risk Factors/Patient Goals Review   Personal Goals Review Weight Management/Obesity Weight Management/Obesity      Review  - Patient has completed 9 sessions losing 4 lbs.       Expected Outcomes  - Patient will complete the program meeting his personal goals.          Core Components/Risk Factors/Patient Goals at Discharge (Final Review):      Goals and Risk Factor Review - 08/10/16 1518      Core Components/Risk Factors/Patient Goals Review   Personal Goals Review Weight Management/Obesity   Review Patient has completed 9 sessions losing 4 lbs.    Expected Outcomes Patient will complete the program meeting his personal goals.       ITP Comments:     ITP Comments    Row Name 07/21/16 1419           ITP Comments Patient new to program. Plans to start Monday 07/25/16.          Comments: ITP 30 Day REVIEW Patient doing well in the program. Will continue to monitor for progress.

## 2016-08-12 ENCOUNTER — Encounter (HOSPITAL_COMMUNITY)
Admission: RE | Admit: 2016-08-12 | Discharge: 2016-08-12 | Disposition: A | Discharge status: Home / Self Care | Payer: Medicare Other | Source: Ambulatory Visit | Attending: Cardiology | Admitting: Cardiology

## 2016-08-12 DIAGNOSIS — I214 Non-ST elevation (NSTEMI) myocardial infarction: Secondary | ICD-10-CM

## 2016-08-12 NOTE — Progress Notes (Signed)
Daily Session Note  Patient Details  Name: Stephen Macdonald MRN: [id number] Date of Birth: 1946-04-03 Referring Provider:     [place] from 07/21/2016 in [place]  Referring Provider  Dr. Janese Banks      Encounter Date: 08/12/2016  Check In:     Session Check In - 08/12/16 0943      Check-In   Location [facility]   Staff Present Russella Dar, MS, EP, [facility], Exercise Physiologist;Joahan Swatzell Luther Parody, BS, EP, Exercise Physiologist   Supervising physician immediately available to respond to emergencies See telemetry face sheet for immediately available MD   Medication changes reported     No   Fall or balance concerns reported    No   Warm-up and Cool-down Performed as group-led instruction   Resistance Training Performed Yes   VAD Patient? No     Pain Assessment   Currently in Pain? No/denies   Pain Score 0-No pain   Multiple Pain Sites No      Capillary Blood Glucose: No results found for this or any previous visit (from the past 24 hour(s)).    History  Smoking Status  . Current Some Day Smoker  . Types: Cigars, Cigarettes  Smokeless Tobacco  . Not on file    Comment: Discussed cessation and shared we have classes that he could attend.     Goals Met:  Independence with exercise equipment Exercise tolerated well No report of cardiac concerns or symptoms Strength training completed today  Goals Unmet:  Not Applicable  Comments: Check out 1030   Dr. Kate Sable is Medical Director for [place] and Pulmonary Rehab.

## 2016-08-15 ENCOUNTER — Encounter (HOSPITAL_COMMUNITY)
Admission: RE | Admit: 2016-08-15 | Discharge: 2016-08-15 | Disposition: A | Discharge status: Home / Self Care | Payer: Medicare Other | Source: Ambulatory Visit | Attending: Cardiology | Admitting: Cardiology

## 2016-08-15 DIAGNOSIS — I214 Non-ST elevation (NSTEMI) myocardial infarction: Secondary | ICD-10-CM

## 2016-08-15 NOTE — Progress Notes (Signed)
Daily Session Note  Patient Details  Name: Stephen Macdonald MRN: [id number] Date of Birth: Mar 13, 1946 Referring Provider:     [place] from 07/21/2016 in [place]  Referring Provider  Dr. Janese Banks      Encounter Date: 08/15/2016  Check In:     Session Check In - 08/15/16 1115      Check-In   Location [facility]   Staff Present Russella Dar, MS, EP, [facility], Exercise Physiologist;Chassie Pennix Luther Parody, BS, EP, Exercise Physiologist   Supervising physician immediately available to respond to emergencies See telemetry face sheet for immediately available MD   Medication changes reported     No   Fall or balance concerns reported    No   Warm-up and Cool-down Performed as group-led instruction   Resistance Training Performed Yes   VAD Patient? No     Pain Assessment   Currently in Pain? No/denies   Pain Score 0-No pain   Multiple Pain Sites No      Capillary Blood Glucose: No results found for this or any previous visit (from the past 24 hour(s)).    History  Smoking Status  . Current Some Day Smoker  . Types: Cigars, Cigarettes  Smokeless Tobacco  . Not on file    Comment: Discussed cessation and shared we have classes that he could attend.     Goals Met:  Independence with exercise equipment Exercise tolerated well No report of cardiac concerns or symptoms Strength training completed today  Goals Unmet:  Not Applicable  Comments: Check out 1200   Dr. Kate Sable is Medical Director for [place] and Pulmonary Rehab.

## 2016-08-17 ENCOUNTER — Encounter (HOSPITAL_COMMUNITY)
Admission: RE | Admit: 2016-08-17 | Discharge: 2016-08-17 | Disposition: A | Discharge status: Home / Self Care | Payer: Medicare Other | Source: Ambulatory Visit | Attending: Cardiology | Admitting: Cardiology

## 2016-08-17 DIAGNOSIS — F17210 Nicotine dependence, cigarettes, uncomplicated: Secondary | ICD-10-CM | POA: Insufficient documentation

## 2016-08-17 DIAGNOSIS — I130 Hypertensive heart and chronic kidney disease with heart failure and stage 1 through stage 4 chronic kidney disease, or unspecified chronic kidney disease: Secondary | ICD-10-CM | POA: Diagnosis not present

## 2016-08-17 DIAGNOSIS — Z955 Presence of coronary angioplasty implant and graft: Secondary | ICD-10-CM

## 2016-08-17 DIAGNOSIS — Z7982 Long term (current) use of aspirin: Secondary | ICD-10-CM | POA: Diagnosis not present

## 2016-08-17 DIAGNOSIS — I5042 Chronic combined systolic (congestive) and diastolic (congestive) heart failure: Secondary | ICD-10-CM | POA: Insufficient documentation

## 2016-08-17 DIAGNOSIS — I2510 Atherosclerotic heart disease of native coronary artery without angina pectoris: Secondary | ICD-10-CM | POA: Diagnosis not present

## 2016-08-17 DIAGNOSIS — Z79899 Other long term (current) drug therapy: Secondary | ICD-10-CM | POA: Insufficient documentation

## 2016-08-17 DIAGNOSIS — I214 Non-ST elevation (NSTEMI) myocardial infarction: Secondary | ICD-10-CM | POA: Insufficient documentation

## 2016-08-17 DIAGNOSIS — E7800 Pure hypercholesterolemia, unspecified: Secondary | ICD-10-CM | POA: Insufficient documentation

## 2016-08-17 DIAGNOSIS — N183 Chronic kidney disease, stage 3 (moderate): Secondary | ICD-10-CM | POA: Insufficient documentation

## 2016-08-17 NOTE — Progress Notes (Signed)
Daily Session Note  Patient Details  Name: Stephen Macdonald MRN: [id number] Date of Birth: [birth year] Referring Provider:     CARDIAC REHAB PHASE II ORIENTATION from 07/21/2016 in [place]  Referring Provider  Dr. Janese Banks      Encounter Date: 08/17/2016  Check In:     Session Check In - 08/17/16 0930      Check-In   Location [facility]   Staff Present Russella Dar, MS, EP, [facility], Exercise Physiologist;Imaan Padgett Wynetta Emery, RN, BSN   Supervising physician immediately available to respond to emergencies See telemetry face sheet for immediately available MD   Medication changes reported     No   Fall or balance concerns reported    No   Warm-up and Cool-down Performed as group-led instruction   Resistance Training Performed Yes   VAD Patient? No     Pain Assessment   Currently in Pain? No/denies   Pain Score 0-No pain   Multiple Pain Sites No      Capillary Blood Glucose: No results found for this or any previous visit (from the past 24 hour(s)).    History  Smoking Status  . Current Some Day Smoker  . Types: Cigars, Cigarettes  Smokeless Tobacco  . Not on file    Comment: Discussed cessation and shared we have classes that he could attend.     Goals Met:  Independence with exercise equipment Exercise tolerated well No report of cardiac concerns or symptoms Strength training completed today  Goals Unmet:  Not Applicable  Comments: Check out 1030.   Dr. Kate Sable is Medical Director for [facility] Cardiac and Pulmonary Rehab.

## 2016-08-19 ENCOUNTER — Encounter (HOSPITAL_COMMUNITY)
Admission: RE | Admit: 2016-08-19 | Discharge: 2016-08-19 | Disposition: A | Discharge status: Home / Self Care | Payer: Medicare Other | Source: Ambulatory Visit | Attending: Cardiology | Admitting: Cardiology

## 2016-08-19 DIAGNOSIS — I214 Non-ST elevation (NSTEMI) myocardial infarction: Secondary | ICD-10-CM | POA: Diagnosis not present

## 2016-08-19 NOTE — Progress Notes (Signed)
Daily Session Note  Patient Details  Name: Stephen Macdonald MRN: [id number] Date of Birth: [birth year] Referring Provider:     CARDIAC REHAB PHASE II ORIENTATION from 07/21/2016 in [place]  Referring Provider  Dr. Janese Banks      Encounter Date: 08/19/2016  Check In:     Session Check In - 08/19/16 0930      Check-In   Location [facility]   Staff Present Russella Dar, MS, EP, [facility], Exercise Physiologist;Debra Wynetta Emery, RN, BSN   Supervising physician immediately available to respond to emergencies See telemetry face sheet for immediately available MD   Medication changes reported     No   Fall or balance concerns reported    No   Tobacco Cessation No Change   Warm-up and Cool-down Performed as group-led instruction   Resistance Training Performed Yes   VAD Patient? No     Pain Assessment   Currently in Pain? No/denies   Pain Score 0-No pain   Multiple Pain Sites No      Capillary Blood Glucose: No results found for this or any previous visit (from the past 24 hour(s)).    History  Smoking Status  . Current Some Day Smoker  . Types: Cigars, Cigarettes  Smokeless Tobacco  . Not on file    Comment: Discussed cessation and shared we have classes that he could attend.     Goals Met:  Independence with exercise equipment Exercise tolerated well No report of cardiac concerns or symptoms Strength training completed today  Goals Unmet:  Not Applicable  Comments: Check out 1030   Dr. Kate Sable is Medical Director for [place] and Pulmonary Rehab.

## 2016-08-19 NOTE — Progress Notes (Signed)
Daily Session Note  Patient Details  Name: Stephen Macdonald MRN: [id number] Date of Birth: [birth year] Referring Provider:     CARDIAC REHAB PHASE II ORIENTATION from 07/21/2016 in [place]  Referring Provider  Dr. Janese Banks      Encounter Date: 08/19/2016  Check In:     Session Check In - 08/19/16 0930      Check-In   Location [facility]   Staff Present Russella Dar, MS, EP, [facility], Exercise Physiologist;Debra Wynetta Emery, RN, BSN   Supervising physician immediately available to respond to emergencies See telemetry face sheet for immediately available MD   Medication changes reported     No   Fall or balance concerns reported    No   Tobacco Cessation No Change   Warm-up and Cool-down Performed as group-led instruction   Resistance Training Performed Yes   VAD Patient? No     Pain Assessment   Currently in Pain? No/denies   Pain Score 0-No pain   Multiple Pain Sites No      Capillary Blood Glucose: No results found for this or any previous visit (from the past 24 hour(s)).    History  Smoking Status  . Current Some Day Smoker  . Types: Cigars, Cigarettes  Smokeless Tobacco  . Not on file    Comment: Discussed cessation and shared we have classes that he could attend.     Goals Met:  Independence with exercise equipment Exercise tolerated well No report of cardiac concerns or symptoms Strength training completed today  Goals Unmet:  Not Applicable  Comments: Check out: 1030   Dr. Kate Sable is Medical Director for [place] and Pulmonary Rehab.

## 2016-08-22 ENCOUNTER — Encounter (HOSPITAL_COMMUNITY)
Admission: RE | Admit: 2016-08-22 | Discharge: 2016-08-22 | Disposition: A | Discharge status: Home / Self Care | Payer: Medicare Other | Source: Ambulatory Visit | Attending: Cardiology | Admitting: Cardiology

## 2016-08-22 DIAGNOSIS — I214 Non-ST elevation (NSTEMI) myocardial infarction: Secondary | ICD-10-CM | POA: Diagnosis not present

## 2016-08-22 NOTE — Progress Notes (Signed)
Daily Session Note  Patient Details  Name: Stephen Macdonald MRN: [id number] Date of Birth: [birth year] Referring Provider:     CARDIAC REHAB PHASE II ORIENTATION from 07/21/2016 in [place]  Referring Provider  Dr. Janese Banks      Encounter Date: 08/22/2016  Check In:     Session Check In - 08/22/16 0930      Check-In   Location [facility]   Staff Present Russella Dar, MS, EP, [facility], Exercise Physiologist;Debra Wynetta Emery, RN, BSN   Supervising physician immediately available to respond to emergencies See telemetry face sheet for immediately available MD   Medication changes reported     No   Fall or balance concerns reported    No   Warm-up and Cool-down Performed as group-led instruction   Resistance Training Performed Yes   VAD Patient? No     Pain Assessment   Currently in Pain? No/denies   Pain Score 0-No pain   Multiple Pain Sites No      Capillary Blood Glucose: No results found for this or any previous visit (from the past 24 hour(s)).    History  Smoking Status  . Current Some Day Smoker  . Types: Cigars, Cigarettes  Smokeless Tobacco  . Not on file    Comment: Discussed cessation and shared we have classes that he could attend.     Goals Met:  Independence with exercise equipment Exercise tolerated well No report of cardiac concerns or symptoms Strength training completed today  Goals Unmet:  Not Applicable  Comments: Check out: 1030   Dr. Kate Sable is Medical Director for [place] and Pulmonary Rehab.

## 2016-08-24 ENCOUNTER — Encounter (HOSPITAL_COMMUNITY)
Admission: RE | Admit: 2016-08-24 | Discharge: 2016-08-24 | Disposition: A | Discharge status: Home / Self Care | Payer: Medicare Other | Source: Ambulatory Visit | Attending: Cardiology | Admitting: Cardiology

## 2016-08-24 DIAGNOSIS — I214 Non-ST elevation (NSTEMI) myocardial infarction: Secondary | ICD-10-CM | POA: Diagnosis not present

## 2016-08-24 DIAGNOSIS — Z955 Presence of coronary angioplasty implant and graft: Secondary | ICD-10-CM

## 2016-08-24 NOTE — Progress Notes (Signed)
Daily Session Note  Patient Details  Name: Stephen Macdonald MRN: [id number] Date of Birth: Aug 26, 1945 Referring Provider:     CARDIAC REHAB PHASE II ORIENTATION from 07/21/2016 in [place]  Referring Provider  Dr. Janese Banks      Encounter Date: 08/24/2016  Check In:     Session Check In - 08/24/16 0930      Check-In   Location [facility]   Staff Present Russella Dar, MS, EP, [facility], Exercise Physiologist;Gregory Luther Parody, BS, EP, Exercise Physiologist;Isayah Ignasiak Wynetta Emery, RN, BSN   Supervising physician immediately available to respond to emergencies See telemetry face sheet for immediately available MD   Medication changes reported     No   Fall or balance concerns reported    No   Warm-up and Cool-down Performed as group-led instruction   Resistance Training Performed Yes   VAD Patient? No     Pain Assessment   Currently in Pain? No/denies   Pain Score 0-No pain   Multiple Pain Sites No      Capillary Blood Glucose: No results found for this or any previous visit (from the past 24 hour(s)).    History  Smoking Status  . Current Some Day Smoker  . Types: Cigars, Cigarettes  Smokeless Tobacco  . Not on file    Comment: Discussed cessation and shared we have classes that he could attend.     Goals Met:  Independence with exercise equipment Exercise tolerated well No report of cardiac concerns or symptoms Strength training completed today  Goals Unmet:  Not Applicable  Comments: Check out 1030.   Dr. Kate Sable is Medical Director for [facility] Cardiac and Pulmonary Rehab.

## 2016-08-26 ENCOUNTER — Encounter (HOSPITAL_COMMUNITY)
Admission: RE | Admit: 2016-08-26 | Discharge: 2016-08-26 | Disposition: A | Discharge status: Home / Self Care | Payer: Medicare Other | Source: Ambulatory Visit | Attending: Cardiology | Admitting: Cardiology

## 2016-08-26 DIAGNOSIS — Z955 Presence of coronary angioplasty implant and graft: Secondary | ICD-10-CM

## 2016-08-26 DIAGNOSIS — I214 Non-ST elevation (NSTEMI) myocardial infarction: Secondary | ICD-10-CM | POA: Diagnosis not present

## 2016-08-26 NOTE — Progress Notes (Signed)
Daily Session Note  Patient Details  Name: Stephen Macdonald MRN: [id number] Date of Birth: 02-23-1946 Referring Provider:     CARDIAC REHAB PHASE II ORIENTATION from 07/21/2016 in [place]  Referring Provider  Dr. Janese Banks      Encounter Date: 08/26/2016  Check In:     Session Check In - 08/26/16 0930      Check-In   Location [facility]   Staff Present Suzanne Boron, BS, EP, Exercise Physiologist;Deniya Craigo Wynetta Emery, RN, BSN   Supervising physician immediately available to respond to emergencies See telemetry face sheet for immediately available MD   Medication changes reported     No   Fall or balance concerns reported    No   Tobacco Cessation No Change   Warm-up and Cool-down Performed as group-led instruction   Resistance Training Performed Yes   VAD Patient? No     Pain Assessment   Currently in Pain? No/denies   Pain Score 0-No pain   Multiple Pain Sites No      Capillary Blood Glucose: No results found for this or any previous visit (from the past 24 hour(s)).    History  Smoking Status  . Current Some Day Smoker  . Types: Cigars, Cigarettes  Smokeless Tobacco  . Not on file    Comment: Discussed cessation and shared we have classes that he could attend.     Goals Met:  Independence with exercise equipment Exercise tolerated well No report of cardiac concerns or symptoms Strength training completed today  Goals Unmet:  Not Applicable  Comments: Check out 1030.   Dr. Kate Sable is Medical Director for [facility] Cardiac and Pulmonary Rehab.

## 2016-08-29 ENCOUNTER — Encounter (HOSPITAL_COMMUNITY)
Admission: RE | Admit: 2016-08-29 | Discharge: 2016-08-29 | Disposition: A | Discharge status: Home / Self Care | Payer: Medicare Other | Source: Ambulatory Visit | Attending: Cardiology | Admitting: Cardiology

## 2016-08-29 DIAGNOSIS — Z955 Presence of coronary angioplasty implant and graft: Secondary | ICD-10-CM

## 2016-08-29 DIAGNOSIS — I214 Non-ST elevation (NSTEMI) myocardial infarction: Secondary | ICD-10-CM

## 2016-08-29 NOTE — Progress Notes (Signed)
Daily Session Note  Patient Details  Name: Stephen Macdonald MRN: [id number] Date of Birth: January 21, 1946 Referring Provider:     CARDIAC REHAB PHASE II ORIENTATION from 07/21/2016 in [place]  Referring Provider  Dr. Janese Banks      Encounter Date: 08/29/2016  Check In:     Session Check In - 08/29/16 0930      Check-In   Location [facility]   Staff Present Suzanne Boron, BS, EP, Exercise Physiologist;Caliber Landess Wynetta Emery, RN, BSN   Supervising physician immediately available to respond to emergencies See telemetry face sheet for immediately available MD   Medication changes reported     No   Fall or balance concerns reported    No   Tobacco Cessation No Change   Warm-up and Cool-down Performed as group-led instruction   Resistance Training Performed Yes   VAD Patient? No     Pain Assessment   Currently in Pain? No/denies   Pain Score 0-No pain   Multiple Pain Sites No      Capillary Blood Glucose: No results found for this or any previous visit (from the past 24 hour(s)).    History  Smoking Status  . Current Some Day Smoker  . Types: Cigars, Cigarettes  Smokeless Tobacco  . Not on file    Comment: Discussed cessation and shared we have classes that he could attend.     Goals Met:  Independence with exercise equipment Exercise tolerated well No report of cardiac concerns or symptoms Strength training completed today  Goals Unmet:  Not Applicable  Comments: Check out 1030.   Dr. Kate Sable is Medical Director for [facility] Cardiac and Pulmonary Rehab.

## 2016-08-31 ENCOUNTER — Encounter (HOSPITAL_COMMUNITY)
Admission: RE | Admit: 2016-08-31 | Discharge: 2016-08-31 | Disposition: A | Discharge status: Home / Self Care | Payer: Medicare Other | Source: Ambulatory Visit | Attending: Cardiology | Admitting: Cardiology

## 2016-08-31 DIAGNOSIS — Z955 Presence of coronary angioplasty implant and graft: Secondary | ICD-10-CM

## 2016-08-31 DIAGNOSIS — I214 Non-ST elevation (NSTEMI) myocardial infarction: Secondary | ICD-10-CM

## 2016-08-31 NOTE — Progress Notes (Signed)
Daily Session Note  Patient Details  Name: Stephen Macdonald MRN: [id number] Date of Birth: 10/10/1945 Referring Provider:     CARDIAC REHAB PHASE II ORIENTATION from 07/21/2016 in [place]  Referring Provider  Dr. Janese Banks      Encounter Date: 08/31/2016  Check In:     Session Check In - 08/31/16 0930      Check-In   Location [facility]   Staff Present Suzanne Boron, BS, EP, Exercise Physiologist;Elgar Scoggins Wynetta Emery, RN, BSN   Supervising physician immediately available to respond to emergencies See telemetry face sheet for immediately available MD   Medication changes reported     No   Fall or balance concerns reported    No   Warm-up and Cool-down Performed as group-led instruction   Resistance Training Performed Yes   VAD Patient? No     Pain Assessment   Currently in Pain? No/denies   Pain Score 0-No pain   Multiple Pain Sites No      Capillary Blood Glucose: No results found for this or any previous visit (from the past 24 hour(s)).    History  Smoking Status  . Current Some Day Smoker  . Types: Cigars, Cigarettes  Smokeless Tobacco  . Not on file    Comment: Discussed cessation and shared we have classes that he could attend.     Goals Met:  Independence with exercise equipment Exercise tolerated well No report of cardiac concerns or symptoms Strength training completed today  Goals Unmet:  Not Applicable  Comments: Check out 1030.   Dr. Kate Sable is Medical Director for [facility] Cardiac and Pulmonary Rehab.

## 2016-09-02 ENCOUNTER — Encounter (HOSPITAL_COMMUNITY)
Admission: RE | Admit: 2016-09-02 | Discharge: 2016-09-02 | Disposition: A | Discharge status: Home / Self Care | Payer: Medicare Other | Source: Ambulatory Visit | Attending: Cardiology | Admitting: Cardiology

## 2016-09-02 DIAGNOSIS — I214 Non-ST elevation (NSTEMI) myocardial infarction: Secondary | ICD-10-CM | POA: Diagnosis not present

## 2016-09-02 NOTE — Progress Notes (Signed)
Daily Session Note  Patient Details  Name: Stephen Macdonald MRN: [id number] Date of Birth: 11/20/1945 Referring Provider:     [place] from 07/21/2016 in [place]  Referring Provider  Dr. Janese Banks      Encounter Date: 09/02/2016  Check In:     Session Check In - 09/02/16 0930      Check-In   Location [facility]   Staff Present Russella Dar, MS, EP, [facility], Exercise Physiologist;Gregory Luther Parody, BS, EP, Exercise Physiologist   Supervising physician immediately available to respond to emergencies See telemetry face sheet for immediately available MD   Medication changes reported     No   Fall or balance concerns reported    No   Tobacco Cessation No Change   Warm-up and Cool-down Performed as group-led instruction   Resistance Training Performed Yes   VAD Patient? No     Pain Assessment   Currently in Pain? No/denies   Pain Score 0-No pain   Multiple Pain Sites No      Capillary Blood Glucose: No results found for this or any previous visit (from the past 24 hour(s)).    History  Smoking Status  . Current Some Day Smoker  . Types: Cigars, Cigarettes  Smokeless Tobacco  . Not on file    Comment: Discussed cessation and shared we have classes that he could attend.     Goals Met:  Independence with exercise equipment Exercise tolerated well No report of cardiac concerns or symptoms  Goals Unmet:  Not Applicable  Comments: Check out: 10:30   Dr. Kate Sable is Medical Director for [place] and Pulmonary Rehab.

## 2016-09-05 ENCOUNTER — Encounter (HOSPITAL_COMMUNITY): Payer: Medicare Other

## 2016-09-07 ENCOUNTER — Encounter (HOSPITAL_COMMUNITY)
Admission: RE | Admit: 2016-09-07 | Discharge: 2016-09-07 | Disposition: A | Discharge status: Home / Self Care | Payer: Medicare Other | Source: Ambulatory Visit | Attending: Cardiology | Admitting: Cardiology

## 2016-09-07 DIAGNOSIS — Z955 Presence of coronary angioplasty implant and graft: Secondary | ICD-10-CM

## 2016-09-07 DIAGNOSIS — I214 Non-ST elevation (NSTEMI) myocardial infarction: Secondary | ICD-10-CM

## 2016-09-07 NOTE — Progress Notes (Signed)
Daily Session Note  Patient Details  Name: PIO EATHERLY MRN: [id number] Date of Birth: [birth year] Referring Provider:     [place] from 07/21/2016 in [place]  Referring Provider  Dr. Janese Banks      Encounter Date: 09/07/2016  Check In:     Session Check In - 09/07/16 0930      Check-In   Location [facility]   Staff Present Suzanne Boron, BS, EP, Exercise Physiologist;Katalea Ucci Wynetta Emery, RN, BSN   Supervising physician immediately available to respond to emergencies See telemetry face sheet for immediately available MD   Medication changes reported     No   Fall or balance concerns reported    No   Warm-up and Cool-down Performed as group-led instruction   Resistance Training Performed Yes   VAD Patient? No     Pain Assessment   Currently in Pain? No/denies   Pain Score 0-No pain   Multiple Pain Sites No      Capillary Blood Glucose: No results found for this or any previous visit (from the past 24 hour(s)).    History  Smoking Status  . Current Some Day Smoker  . Types: Cigars, Cigarettes  Smokeless Tobacco  . Not on file    Comment: Discussed cessation and shared we have classes that he could attend.     Goals Met:  Independence with exercise equipment Exercise tolerated well No report of cardiac concerns or symptoms Strength training completed today  Goals Unmet:  Not Applicable  Comments: Check out 1030.   Dr. Kate Sable is Medical Director for [facility] Cardiac and Pulmonary Rehab.

## 2016-09-08 NOTE — Progress Notes (Signed)
Cardiac Individual Treatment Plan  Patient Details  Name: Stephen Macdonald MRN: [id number] Date of Birth: May 24, 1945 Referring Provider:     CARDIAC REHAB PHASE II ORIENTATION from 07/21/2016 in [place]  Referring Provider  Dr. Janese Banks      Initial Encounter Date:    CARDIAC REHAB PHASE II ORIENTATION from 07/21/2016 in [place]  Date  07/21/16  Referring Provider  Dr. Janese Banks      Visit Diagnosis: NSTEMI (non-ST elevated myocardial infarction) [facility])  Status post coronary artery stent placement  Patient's Home Medications on Admission:  Current Outpatient Prescriptions:  .  aspirin EC 81 MG tablet, Take 81 mg by mouth every morning. , Disp: , Rfl:  .  atorvastatin (LIPITOR) 80 MG tablet, Take 1 tablet (80 mg total) by mouth daily. (Patient taking differently: Take 80 mg by mouth at bedtime. ), Disp: 30 tablet, Rfl: 11 .  cholecalciferol (VITAMIN D) 1000 UNITS tablet, Take 1,000 Units by mouth at bedtime. , Disp: , Rfl:  .  furosemide (LASIX) 40 MG tablet, Take 1 tablet (40 mg total) by mouth daily. (Patient taking differently: Take 40 mg by mouth daily as needed. ), Disp: 30 tablet, Rfl: 3 .  metoprolol succinate (TOPROL-XL) 25 MG 24 hr tablet, Take 25 mg by mouth every morning., Disp: , Rfl:  .  nitroGLYCERIN (NITROSTAT) 0.4 MG SL tablet, Place 1 tablet (0.4 mg total) under the tongue every 5 (five) minutes as needed for chest pain., Disp: 25 tablet, Rfl: 3 .  potassium chloride 20 MEQ TBCR, Take 20 mEq by mouth daily. (Patient taking differently: Take 20 mEq by mouth daily as needed. ), Disp: 30 tablet, Rfl: 3 .  tamsulosin (FLOMAX) 0.4 MG CAPS capsule, Take 0.4 mg by mouth every morning., Disp: , Rfl:  .  ticagrelor (BRILINTA) 90 MG TABS tablet, Take 1 tablet (90 mg total) by mouth 2 (two) times daily., Disp: 60 tablet, Rfl: 6 .  valsartan (DIOVAN) 40 MG tablet, Take 40 mg by mouth at bedtime. , Disp: , Rfl:   Past Medical History: Past  Medical History:  Diagnosis Date  . Chronic combined systolic and diastolic CHF, NYHA class 3 ([place])    a. 01/2015 Echo: EF 35-30%; b. 03/2016 Echo: Ef 20%, Gr1DD, mid-apical anterior, mid anteroseptal, apical inferior, basal inferolateral, mid anterolateral, and apical AK, basal anteroseptal and mid inferior severe HK, midlly dil LA, mild TR.  . CKD (chronic kidney disease), stage III 04/15/2016  . Coronary artery disease    a. 1997 s/p CABG;  b. 01/2015 MI/PCI: G->PDA;  c. 03/2016 NSTEMI/PCI: LM 20, LAD 148m D2 80, RI 60, LCX ok, OM1 90, RCA 60p, 1070mRPAV 80, G->RPDA 15p ISR, 90d (3.5x28 Promus Premier DES), LIMA->OM1->dLAD nl.  . High cholesterol   . Hypertension   . Ischemic cardiomyopathy    a. 01/2015 Echo: EF 25-30%; b. s/p AICD;  c. 03/2016 Echo: Ef 20%, Gr1DD.  . Marland Kitchenmoker     Tobacco Use: History  Smoking Status  . Current Some Day Smoker  . Types: Cigars, Cigarettes  Smokeless Tobacco  . Not on file    Comment: Discussed cessation and shared we have classes that he could attend.     Labs: Recent Review Flowsheet Data    Labs for ITP Cardiac and Pulmonary Rehab Latest Ref Rng & Units 02/12/2015 04/16/2016   Cholestrol 0 - 200 mg/dL - 157   LDLCALC 0 - 99 mg/dL - 95   HDL >40  mg/dL - 43   Trlycerides <161 mg/dL - 96   Hemoglobin W9U 4.8 - 5.6 % - 5.6   TCO2 0 - 100 mmol/L 18 -      Capillary Blood Glucose: No results found for: GLUCAP   Exercise Target Goals:    Exercise Program Goal: Individual exercise prescription set with THRR, safety & activity barriers. Participant demonstrates ability to understand and report RPE using BORG scale, to self-measure pulse accurately, and to acknowledge the importance of the exercise prescription.  Exercise Prescription Goal: Starting with aerobic activity 30 plus minutes a day, 3 days per week for initial exercise prescription. Provide home exercise prescription and guidelines that participant acknowledges understanding  prior to discharge.  Activity Barriers & Risk Stratification:   6 Minute Walk:     6 Minute Walk    Row Name 07/21/16 0959         6 Minute Walk   Phase Initial     Distance 1700 feet     Distance % Change 0 %     Walk Time 6 minutes     # of Rest Breaks 0     MPH 3.21     METS 3.46     RPE 9     Perceived Dyspnea  9     VO2 Peak 11.54     Symptoms No     Resting HR 76 bpm     Resting BP 112/70     Max Ex. HR 100 bpm     Max Ex. BP 124/70     2 Minute Post BP 110/68        Oxygen Initial Assessment:   Oxygen Re-Evaluation:   Oxygen Discharge (Final Oxygen Re-Evaluation):   Initial Exercise Prescription:     Initial Exercise Prescription - 07/21/16 1000      Date of Initial Exercise RX and Referring Provider   Date 07/21/16   Referring Provider Dr. Smith Robert     Treadmill   MPH 2.5   Grade 0   Minutes 20   METs 2.9     NuStep   Level 2   SPM 25   Minutes 15   METs 2     Prescription Details   Frequency (times per week) 3   Duration Progress to 30 minutes of continuous aerobic without signs/symptoms of physical distress     Intensity   THRR 40-80% of Max Heartrate 105-120-134   Ratings of Perceived Exertion 11-13   Perceived Dyspnea 0-4     Progression   Progression Continue progressive overload as per policy without signs/symptoms or physical distress.     Resistance Training   Training Prescription Yes   Weight 1   Reps 10-15      Perform Capillary Blood Glucose checks as needed.  Exercise Prescription Changes:      Exercise Prescription Changes    Row Name 08/08/16 1400 09/02/16 1400           Response to Exercise   Blood Pressure (Admit) 110/70 112/60      Blood Pressure (Exercise) 100/60 130/68      Blood Pressure (Exit) 108/60 118/62      Heart Rate (Admit) 57 bpm 75 bpm      Heart Rate (Exercise) 116 bpm 90 bpm      Heart Rate (Exit) 66 bpm 83 bpm      Rating of Perceived Exertion (Exercise) 9 9      Duration  Progress to 30 minutes of  aerobic without signs/symptoms of physical distress Progress to 30 minutes of  aerobic without signs/symptoms of physical distress      Intensity THRR unchanged THRR unchanged        Progression   Progression Continue to progress workloads to maintain intensity without signs/symptoms of physical distress. Continue to progress workloads to maintain intensity without signs/symptoms of physical distress.        Resistance Training   Training Prescription Yes Yes      Weight 2 3      Reps 10-15 10-15        Treadmill   MPH 2.7 3      Grade 0 0.5      Minutes 15 15      METs 3 3.5        NuStep   Level 3 3      SPM 84 118      Minutes 20 20      METs 3.7 3.7        Home Exercise Plan   Plans to continue exercise at Home (comment) Home (comment)      Frequency Add 2 additional days to program exercise sessions. Add 2 additional days to program exercise sessions.         Exercise Comments:      Exercise Comments    Row Name 08/08/16 1420 09/02/16 1444         Exercise Comments Patient is doing very well in CR.  Patient is progressing very well in CR.          Exercise Goals and Review:      Exercise Goals    Row Name 07/21/16 1134             Exercise Goals   Increase Physical Activity Yes       Intervention Provide advice, education, support and counseling about physical activity/exercise needs.;Develop an individualized exercise prescription for aerobic and resistive training based on initial evaluation findings, risk stratification, comorbidities and participant's personal goals.       Expected Outcomes Achievement of increased cardiorespiratory fitness and enhanced flexibility, muscular endurance and strength shown through measurements of functional capacity and personal statement of participant.       Increase Strength and Stamina Yes       Intervention Provide advice, education, support and counseling about physical activity/exercise  needs.;Develop an individualized exercise prescription for aerobic and resistive training based on initial evaluation findings, risk stratification, comorbidities and participant's personal goals.       Expected Outcomes Achievement of increased cardiorespiratory fitness and enhanced flexibility, muscular endurance and strength shown through measurements of functional capacity and personal statement of participant.          Exercise Goals Re-Evaluation :     Exercise Goals Re-Evaluation    Row Name 08/10/16 1519 09/08/16 0816           Exercise Goal Re-Evaluation   Exercise Goals Review Increase Physical Activity;Increase Strenth and Stamina  Get functional again.   Increase Physical Activity;Increase Strenth and Stamina  Get functional again.       Comments Patient has completed 9 sessions. He has progress well and says he has more energy to do things around the house.  After completing 20 sessions, patient says he is getting stronger and is doing more around the house. He said he was able to use a sledge hammer for the first time in years. He continues to progress and do well in the program.  Expected Outcomes Patient will complete the program with increased strength, stamina, and activity. Patient will complete the program with continued increased strength, stamina, and activity.           Discharge Exercise Prescription (Final Exercise Prescription Changes):     Exercise Prescription Changes - 09/02/16 1400      Response to Exercise   Blood Pressure (Admit) 112/60   Blood Pressure (Exercise) 130/68   Blood Pressure (Exit) 118/62   Heart Rate (Admit) 75 bpm   Heart Rate (Exercise) 90 bpm   Heart Rate (Exit) 83 bpm   Rating of Perceived Exertion (Exercise) 9   Duration Progress to 30 minutes of  aerobic without signs/symptoms of physical distress   Intensity THRR unchanged     Progression   Progression Continue to progress workloads to maintain intensity without  signs/symptoms of physical distress.     Resistance Training   Training Prescription Yes   Weight 3   Reps 10-15     Treadmill   MPH 3   Grade 0.5   Minutes 15   METs 3.5     NuStep   Level 3   SPM 118   Minutes 20   METs 3.7     Home Exercise Plan   Plans to continue exercise at Home (comment)   Frequency Add 2 additional days to program exercise sessions.      Nutrition:  Target Goals: Understanding of nutrition guidelines, daily intake of sodium 1500mg , cholesterol 200mg , calories 30% from fat and 7% or less from saturated fats, daily to have 5 or more servings of fruits and vegetables.  Biometrics:     Pre Biometrics - 07/21/16 1002      Pre Biometrics   Height 6\' 1"  (1.854 m)   Weight 239 lb 13.8 oz (108.8 kg)   Waist Circumference 41 inches   Hip Circumference 40.5 inches   Waist to Hip Ratio 1.01 %   BMI (Calculated) 31.7   Triceps Skinfold 8 mm   % Body Fat 26.5 %   Grip Strength 92.67 kg   Flexibility 10 in   Single Leg Stand 14 seconds       Nutrition Therapy Plan and Nutrition Goals:   Nutrition Discharge: Rate Your Plate Scores:   Nutrition Goals Re-Evaluation:   Nutrition Goals Discharge (Final Nutrition Goals Re-Evaluation):   Psychosocial: Target Goals: Acknowledge presence or absence of significant depression and/or stress, maximize coping skills, provide positive support system. Participant is able to verbalize types and ability to use techniques and skills needed for reducing stress and depression.  Initial Review & Psychosocial Screening:     Initial Psych Review & Screening - 07/21/16 1141      Initial Review   Current issues with Current Depression     Family Dynamics   Good Support System? Yes     Barriers   Psychosocial barriers to participate in program Psychosocial barriers identified (see note)  He is a little down because of his recent heart problems. He says he is feeling better.      Screening Interventions    Interventions Encouraged to exercise      Quality of Life Scores:     Quality of Life - 07/21/16 1002      Quality of Life Scores   Health/Function Pre 16.23 %   Socioeconomic Pre 24.75 %   Psych/Spiritual Pre 21.14 %   Family Pre 28.8 %   GLOBAL Pre 20.73 %      PHQ-9:  Recent Review Flowsheet Data    Depression screen [facility] 2/9 07/21/2016 11/05/2015 06/16/2015   Decreased Interest 1 1 1    Down, Depressed, Hopeless 0 0 1   PHQ - 2 Score 1 1 2    Altered sleeping 0 0 0   Tired, decreased energy 2 1 0   Change in appetite 0 0 0   Feeling bad or failure about yourself  0 0 0   Trouble concentrating 0 0 0   Moving slowly or fidgety/restless 0 0 0   Suicidal thoughts 0 0 0   PHQ-9 Score 3 2 2    Difficult doing work/chores Somewhat difficult Somewhat difficult Somewhat difficult     Interpretation of Total Score  Total Score Depression Severity:  1-4 = Minimal depression, 5-9 = Mild depression, 10-14 = Moderate depression, 15-19 = Moderately severe depression, 20-27 = Severe depression   Psychosocial Evaluation and Intervention:     Psychosocial Evaluation - 07/21/16 1142      Psychosocial Evaluation & Interventions   Interventions Encouraged to exercise with the program and follow exercise prescription   Comments 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 Just a little depression.  His current health issues has him stressed. Involved in community game like dominoes. He also plays the guitar.    Continue Psychosocial Services  No Follow up required      Psychosocial Re-Evaluation:     Psychosocial Re-Evaluation    Row Name 07/21/16 1144 08/10/16 1521 09/08/16 0819         Psychosocial Re-Evaluation   Current issues with Current Depression None Identified Current Depression     Comments  -  - Patient continues to have a positive outlook and good family support and is managing his depression.     Expected Outcomes  - Patient will have improved QOL and PHQ-9 scores  at discharge.  Patient will have improved PHQ-9 and QOL scores at discharge.      Interventions Encouraged to attend Pulmonary Rehabilitation for the exercise Encouraged to attend Cardiac Rehabilitation for the exercise  . Encouraged to attend Cardiac Rehabilitation for the exercise;Relaxation education;Stress management education     Continue Psychosocial Services  No Follow up required No Follow up required No Follow up required        Psychosocial Discharge (Final Psychosocial Re-Evaluation):     Psychosocial Re-Evaluation - 09/08/16 0819      Psychosocial Re-Evaluation   Current issues with Current Depression   Comments Patient continues to have a positive outlook and good family support and is managing his depression.   Expected Outcomes Patient will have improved PHQ-9 and QOL scores at discharge.    Interventions Encouraged to attend Cardiac Rehabilitation for the exercise;Relaxation education;Stress management education   Continue Psychosocial Services  No Follow up required      Vocational Rehabilitation: Provide vocational rehab assistance to qualifying candidates.   Vocational Rehab Evaluation & Intervention:     Vocational Rehab - 07/21/16 1130      Initial Vocational Rehab Evaluation & Intervention   Assessment shows need for Vocational Rehabilitation No      Education: Education Goals: Education classes will be provided on a weekly basis, covering required topics. Participant will state understanding/return demonstration of topics presented.  Learning Barriers/Preferences:     Learning Barriers/Preferences - 07/21/16 1129      Learning Barriers/Preferences   Learning Barriers None   Learning Preferences Skilled Demonstration;Individual Instruction;Group Instruction      Education Topics: Hypertension, Hypertension Reduction -Define heart disease  and high blood pressure. Discus how high blood pressure affects the body and ways to reduce high blood  pressure.   CARDIAC REHAB PHASE II EXERCISE from 08/17/2016 in [place] CARDIAC REHABILITATION  Date  08/17/16  Educator  Hart Rochester  Instruction Review Code  2- meets goals/outcomes      Exercise and Your Heart -Discuss why it is important to exercise, the FITT principles of exercise, normal and abnormal responses to exercise, and how to exercise safely.   CARDIAC REHAB PHASE II EXERCISE from 09/16/2015 in [place] CARDIAC REHABILITATION  Date  08/26/15  Educator  Hart Rochester  Instruction Review Code  2- meets goals/outcomes      Angina -Discuss definition of angina, causes of angina, treatment of angina, and how to decrease risk of having angina.   CARDIAC REHAB PHASE II EXERCISE from 09/16/2015 in [place] CARDIAC REHABILITATION  Date  09/02/15  Educator  DCoad  Instruction Review Code  2- meets goals/outcomes      Cardiac Medications -Review what the following cardiac medications are used for, how they affect the body, and side effects that may occur when taking the medications.  Medications include Aspirin, Beta blockers, calcium channel blockers, ACE Inhibitors, angiotensin receptor blockers, diuretics, digoxin, and antihyperlipidemics.   Congestive Heart Failure -Discuss the definition of CHF, how to live with CHF, the signs and symptoms of CHF, and how keep track of weight and sodium intake.   CARDIAC REHAB PHASE II EXERCISE from 09/16/2015 in [place] [facility]IT[facility]  Date  09/16/15  Educator  DC  Instruction Review Code  2- meets goals/outcomes      Heart Disease and Intimacy -Discus the effect sexual activity has on the heart, how changes occur during intimacy as we age, and safety during sexual activity.   CARDIAC REHAB PHASE II EXERCISE from 09/16/2015 in [place] CARDIAC REHABILITATION  Date  06/24/15  Educator  Hart Rochester  Instruction Review Code  2- meets goals/outcomes      Smoking Cessation / COPD -Discuss different methods to quit smoking,  the health benefits of quitting smoking, and the definition of COPD.   CARDIAC REHAB PHASE II EXERCISE from 09/16/2015 in [place] CARDIAC REHABILITATION  Date  07/01/15  Educator  Hart Rochester  Instruction Review Code  2- meets goals/outcomes      Nutrition I: Fats -Discuss the types of cholesterol, what cholesterol does to the heart, and how cholesterol levels can be controlled.   CARDIAC REHAB PHASE II EXERCISE from 09/16/2015 in [place] CARDIAC REHABILITATION  Date  07/08/15  Educator  Hart Rochester  Instruction Review Code  2- meets goals/outcomes      Nutrition II: Labels -Discuss the different components of food labels and how to read food label   CARDIAC REHAB PHASE II EXERCISE from 09/16/2015 in [place] CARDIAC REHABILITATION  Date  07/15/15  Educator  Hart Rochester  Instruction Review Code  2- meets goals/outcomes      Heart Parts and Heart Disease -Discuss the anatomy of the heart, the pathway of blood circulation through the heart, and these are affected by heart disease.   CARDIAC REHAB PHASE II EXERCISE from 09/16/2015 in [place] CARDIAC REHABILITATION  Date  07/22/15  Educator  Hart Rochester  Instruction Review Code  2- meets goals/outcomes      Stress I: Signs and Symptoms -Discuss the causes of stress, how stress may lead to anxiety and depression, and ways to limit stress.   CARDIAC REHAB  PHASE II EXERCISE from 09/16/2015 in [place] CARDIAC REHABILITATION  Date  07/29/15  Educator  Hart Rochester  Instruction Review Code  2- meets goals/outcomes      Stress II: Relaxation -Discuss different types of relaxation techniques to limit stress.   CARDIAC REHAB PHASE II EXERCISE from 08/17/2016 in [place] [facility]  Date  08/17/16      Warning Signs of Stroke / TIA -Discuss definition of a stroke, what the signs and symptoms are of a stroke, and how to identify when someone is having stroke.   CARDIAC REHAB PHASE II EXERCISE from 09/16/2015 in  [place] CARDIAC REHABILITATION  Date  08/12/15  Educator  Hart Rochester  Instruction Review Code  2- meets goals/outcomes      Knowledge Questionnaire Score:     Knowledge Questionnaire Score - 07/21/16 1130      Knowledge Questionnaire Score   Pre Score 27/28      Core Components/Risk Factors/Patient Goals at Admission:     Personal Goals and Risk Factors at Admission - 07/21/16 1135      Core Components/Risk Factors/Patient Goals on Admission    Weight Management Yes   Intervention Weight Management/Obesity: Establish reasonable short term and long term weight goals.   Admit Weight 240 lb (108.9 kg)   Goal Weight: Short Term 235 lb (106.6 kg)   Goal Weight: Long Term 230 lb (104.3 kg)   Expected Outcomes Short Term: Continue to assess and modify interventions until short term weight is achieved;Long Term: Adherence to nutrition and physical activity/exercise program aimed toward attainment of established weight goal   Tobacco Cessation Yes   Number of packs per day --  he has cut down to 1 cigar/day   Intervention Assist the participant in steps to quit. Provide individualized education and counseling about committing to Tobacco Cessation, relapse prevention, and pharmacological support that can be provided by physician.   Expected Outcomes Short Term: Will demonstrate readiness to quit, by selecting a quit date.;Long Term: Complete abstinence from all tobacco products for at least 12 months from quit date.   Lipids --  They are now under control   Personal Goal Other Yes   Personal Goal Regain his funcational capacity. Lose 10 lbs.    Intervention Attend CR 3xweek and supplement exercise at home 2 x week.    Expected Outcomes Achieve personal goals.       Core Components/Risk Factors/Patient Goals Review:      Goals and Risk Factor Review    Row Name 07/21/16 1140 08/10/16 1518 09/08/16 0815         Core Components/Risk Factors/Patient Goals Review   Personal  Goals Review Weight Management/Obesity Weight Management/Obesity Weight Management/Obesity  Lose 10 lbs.      Review  - Patient has completed 9 sessions losing 4 lbs.  Patient has completed 20 sessions losing 2 lbs. He continues to do well in the program and says he feels better since he started.      Expected Outcomes  - Patient will complete the program meeting his personal goals.  Patient will complete the program meeting his weight loss goal.         Core Components/Risk Factors/Patient Goals at Discharge (Final Review):      Goals and Risk Factor Review - 09/08/16 0815      Core Components/Risk Factors/Patient Goals Review   Personal Goals Review Weight Management/Obesity  Lose 10 lbs.    Review Patient has completed 20 sessions losing  2 lbs. He continues to do well in the program and says he feels better since he started.    Expected Outcomes Patient will complete the program meeting his weight loss goal.       ITP Comments:     ITP Comments    Row Name 07/21/16 1419           ITP Comments Patient new to program. Plans to start Monday 07/25/16.          Comments: ITP 30 Day REVIEW Patient doing well in the program. Will continue to monitor for progress.

## 2016-09-09 ENCOUNTER — Encounter (HOSPITAL_COMMUNITY)
Admission: RE | Admit: 2016-09-09 | Discharge: 2016-09-09 | Disposition: A | Discharge status: Home / Self Care | Payer: Medicare Other | Source: Ambulatory Visit | Attending: Cardiology | Admitting: Cardiology

## 2016-09-09 DIAGNOSIS — I214 Non-ST elevation (NSTEMI) myocardial infarction: Secondary | ICD-10-CM

## 2016-09-09 NOTE — Progress Notes (Signed)
Daily Session Note  Patient Details  Name: Stephen Macdonald MRN: [id number] Date of Birth: Sep 17, 1945 Referring Provider:     [place] from 07/21/2016 in [place]  Referring Provider  Dr. Janese Banks      Encounter Date: 09/09/2016  Check In:     Session Check In - 09/09/16 0939      Check-In   Location [facility]   Staff Present Russella Dar, MS, EP, [facility], Exercise Physiologist;Garrie Elenes Luther Parody, BS, EP, Exercise Physiologist   Supervising physician immediately available to respond to emergencies See telemetry face sheet for immediately available MD   Medication changes reported     No   Fall or balance concerns reported    No   Warm-up and Cool-down Performed as group-led instruction   Resistance Training Performed Yes   VAD Patient? No     Pain Assessment   Currently in Pain? No/denies   Pain Score 0-No pain   Multiple Pain Sites No      Capillary Blood Glucose: No results found for this or any previous visit (from the past 24 hour(s)).    History  Smoking Status  . Current Some Day Smoker  . Types: Cigars, Cigarettes  Smokeless Tobacco  . Not on file    Comment: Discussed cessation and shared we have classes that he could attend.     Goals Met:  Independence with exercise equipment Exercise tolerated well No report of cardiac concerns or symptoms Strength training completed today  Goals Unmet:  Not Applicable  Comments: Check out 1030   Dr. Kate Sable is Medical Director for [place] and Pulmonary Rehab.

## 2016-09-12 ENCOUNTER — Encounter (HOSPITAL_COMMUNITY): Payer: Medicare Other

## 2016-09-14 ENCOUNTER — Encounter (HOSPITAL_COMMUNITY): Payer: Medicare Other

## 2016-09-16 ENCOUNTER — Encounter (HOSPITAL_COMMUNITY)
Admission: RE | Admit: 2016-09-16 | Discharge: 2016-09-16 | Disposition: A | Discharge status: Home / Self Care | Payer: Medicare Other | Source: Ambulatory Visit | Attending: Cardiology | Admitting: Cardiology

## 2016-09-16 DIAGNOSIS — E7800 Pure hypercholesterolemia, unspecified: Secondary | ICD-10-CM | POA: Diagnosis not present

## 2016-09-16 DIAGNOSIS — Z7982 Long term (current) use of aspirin: Secondary | ICD-10-CM | POA: Diagnosis not present

## 2016-09-16 DIAGNOSIS — Z955 Presence of coronary angioplasty implant and graft: Secondary | ICD-10-CM

## 2016-09-16 DIAGNOSIS — I5042 Chronic combined systolic (congestive) and diastolic (congestive) heart failure: Secondary | ICD-10-CM | POA: Diagnosis not present

## 2016-09-16 DIAGNOSIS — I214 Non-ST elevation (NSTEMI) myocardial infarction: Secondary | ICD-10-CM | POA: Insufficient documentation

## 2016-09-16 DIAGNOSIS — I130 Hypertensive heart and chronic kidney disease with heart failure and stage 1 through stage 4 chronic kidney disease, or unspecified chronic kidney disease: Secondary | ICD-10-CM | POA: Diagnosis not present

## 2016-09-16 DIAGNOSIS — F17210 Nicotine dependence, cigarettes, uncomplicated: Secondary | ICD-10-CM | POA: Diagnosis not present

## 2016-09-16 DIAGNOSIS — Z79899 Other long term (current) drug therapy: Secondary | ICD-10-CM | POA: Insufficient documentation

## 2016-09-16 DIAGNOSIS — N183 Chronic kidney disease, stage 3 (moderate): Secondary | ICD-10-CM | POA: Insufficient documentation

## 2016-09-16 DIAGNOSIS — I2510 Atherosclerotic heart disease of native coronary artery without angina pectoris: Secondary | ICD-10-CM | POA: Diagnosis not present

## 2016-09-16 NOTE — Progress Notes (Signed)
Daily Session Note  Patient Details  Name: Stephen Macdonald MRN: [id number] Date of Birth: 1945/12/12 Referring Provider:     [place] from 07/21/2016 in [place]  Referring Provider  Dr. Janese Banks      Encounter Date: 09/16/2016  Check In:     Session Check In - 09/16/16 0930      Check-In   Location [facility]   Staff Present Aundra Dubin, RN, BSN;Gregory Luther Parody, BS, EP, Exercise Physiologist   Supervising physician immediately available to respond to emergencies See telemetry face sheet for immediately available MD   Medication changes reported     No   Fall or balance concerns reported    No   Warm-up and Cool-down Performed as group-led instruction   Resistance Training Performed Yes   VAD Patient? No     Pain Assessment   Currently in Pain? No/denies   Pain Score 0-No pain   Multiple Pain Sites No      Capillary Blood Glucose: No results found for this or any previous visit (from the past 24 hour(s)).    History  Smoking Status  . Current Some Day Smoker  . Types: Cigars, Cigarettes  Smokeless Tobacco  . Not on file    Comment: Discussed cessation and shared we have classes that he could attend.     Goals Met:  Independence with exercise equipment Exercise tolerated well No report of cardiac concerns or symptoms Strength training completed today  Goals Unmet:  Not Applicable  Comments: Check out 1030.   Dr. Kate Sable is Medical Director for [facility] Cardiac and Pulmonary Rehab.

## 2016-09-19 ENCOUNTER — Encounter (HOSPITAL_COMMUNITY)
Admission: RE | Admit: 2016-09-19 | Discharge: 2016-09-19 | Disposition: A | Discharge status: Home / Self Care | Payer: Medicare Other | Source: Ambulatory Visit | Attending: Cardiology | Admitting: Cardiology

## 2016-09-19 DIAGNOSIS — I214 Non-ST elevation (NSTEMI) myocardial infarction: Secondary | ICD-10-CM

## 2016-09-19 NOTE — Progress Notes (Signed)
Daily Session Note  Patient Details  Name: Stephen Macdonald MRN: [id number] Date of Birth: [birth year] Referring Provider:     CARDIAC REHAB PHASE II ORIENTATION from 07/21/2016 in [place]  Referring Provider  Dr. Janese Banks      Encounter Date: 09/19/2016  Check In:     Session Check In - 09/19/16 1002      Check-In   Location [facility]   Staff Present Aundra Dubin, RN, BSN;Quaid Yeakle Luther Parody, BS, EP, Exercise Physiologist   Supervising physician immediately available to respond to emergencies See telemetry face sheet for immediately available MD   Medication changes reported     No   Fall or balance concerns reported    No   Warm-up and Cool-down Performed as group-led instruction   Resistance Training Performed Yes   VAD Patient? No     Pain Assessment   Currently in Pain? No/denies   Pain Score 0-No pain   Multiple Pain Sites No      Capillary Blood Glucose: No results found for this or any previous visit (from the past 24 hour(s)).    History  Smoking Status  . Current Some Day Smoker  . Types: Cigars, Cigarettes  Smokeless Tobacco  . Not on file    Comment: Discussed cessation and shared we have classes that he could attend.     Goals Met:  Independence with exercise equipment Exercise tolerated well No report of cardiac concerns or symptoms Strength training completed today  Goals Unmet:  Not Applicable  Comments: Check out 1030   Dr. Kate Sable is Medical Director for [place] and Pulmonary Rehab.

## 2016-09-21 ENCOUNTER — Encounter (HOSPITAL_COMMUNITY)
Admission: RE | Admit: 2016-09-21 | Discharge: 2016-09-21 | Disposition: A | Discharge status: Home / Self Care | Payer: Medicare Other | Source: Ambulatory Visit | Attending: Cardiology | Admitting: Cardiology

## 2016-09-21 DIAGNOSIS — I214 Non-ST elevation (NSTEMI) myocardial infarction: Secondary | ICD-10-CM

## 2016-09-21 DIAGNOSIS — Z955 Presence of coronary angioplasty implant and graft: Secondary | ICD-10-CM

## 2016-09-21 NOTE — Progress Notes (Signed)
Daily Session Note  Patient Details  Name: Stephen Macdonald MRN: [id number] Date of Birth: Aug 11, 1945 Referring Provider:     CARDIAC REHAB PHASE II ORIENTATION from 07/21/2016 in [place]  Referring Provider  Dr. Janese Banks      Encounter Date: 09/21/2016  Check In:     Session Check In - 09/21/16 0930      Check-In   Location [facility]   Staff Present Aundra Dubin, RN, BSN;Gregory Luther Parody, BS, EP, Exercise Physiologist   Supervising physician immediately available to respond to emergencies See telemetry face sheet for immediately available MD   Medication changes reported     No   Fall or balance concerns reported    No   Tobacco Cessation No Change   Warm-up and Cool-down Performed as group-led instruction   Resistance Training Performed Yes   VAD Patient? No     Pain Assessment   Currently in Pain? No/denies   Pain Score 0-No pain   Multiple Pain Sites No      Capillary Blood Glucose: No results found for this or any previous visit (from the past 24 hour(s)).    History  Smoking Status  . Current Some Day Smoker  . Types: Cigars, Cigarettes  Smokeless Tobacco  . Not on file    Comment: Discussed cessation and shared we have classes that he could attend.     Goals Met:  Independence with exercise equipment Exercise tolerated well No report of cardiac concerns or symptoms Strength training completed today  Goals Unmet:  Not Applicable  Comments: Check out 1030.   Dr. Kate Sable is Medical Director for [facility] Cardiac and Pulmonary Rehab.

## 2016-09-23 ENCOUNTER — Encounter (HOSPITAL_COMMUNITY): Payer: Medicare Other

## 2016-09-26 ENCOUNTER — Encounter (HOSPITAL_COMMUNITY)
Admission: RE | Admit: 2016-09-26 | Discharge: 2016-09-26 | Disposition: A | Discharge status: Home / Self Care | Payer: Medicare Other | Source: Ambulatory Visit | Attending: Cardiology | Admitting: Cardiology

## 2016-09-26 DIAGNOSIS — I214 Non-ST elevation (NSTEMI) myocardial infarction: Secondary | ICD-10-CM

## 2016-09-26 DIAGNOSIS — Z955 Presence of coronary angioplasty implant and graft: Secondary | ICD-10-CM

## 2016-09-26 NOTE — Progress Notes (Signed)
Daily Session Note  Patient Details  Name: Stephen Macdonald MRN: [id number] Date of Birth: 08/15/1945 Referring Provider:     [place] from 07/21/2016 in [place]  Referring Provider  Dr. Janese Banks      Encounter Date: 09/26/2016  Check In:     Session Check In - 09/26/16 0930      Check-In   Location [facility]   Staff Present Russella Dar, MS, EP, [facility], Exercise Physiologist;Jacklin Zwick Wynetta Emery, RN, BSN;Gregory Cowan, BS, EP, Exercise Physiologist   Supervising physician immediately available to respond to emergencies See telemetry face sheet for immediately available MD   Medication changes reported     No   Fall or balance concerns reported    No   Tobacco Cessation No Change   Warm-up and Cool-down Performed as group-led instruction   Resistance Training Performed Yes   VAD Patient? No     Pain Assessment   Currently in Pain? No/denies   Pain Score 0-No pain   Multiple Pain Sites No      Capillary Blood Glucose: No results found for this or any previous visit (from the past 24 hour(s)).    History  Smoking Status  . Current Some Day Smoker  . Types: Cigars, Cigarettes  Smokeless Tobacco  . Not on file    Comment: Discussed cessation and shared we have classes that he could attend.     Goals Met:  Independence with exercise equipment Exercise tolerated well No report of cardiac concerns or symptoms Strength training completed today  Goals Unmet:  Not Applicable  Comments: Check out 1030.   Dr. Kate Sable is Medical Director for [facility] Cardiac and Pulmonary Rehab.

## 2016-09-28 ENCOUNTER — Encounter (HOSPITAL_COMMUNITY)
Admission: RE | Admit: 2016-09-28 | Discharge: 2016-09-28 | Disposition: A | Discharge status: Home / Self Care | Payer: Medicare Other | Source: Ambulatory Visit | Attending: Cardiology | Admitting: Cardiology

## 2016-09-28 DIAGNOSIS — Z955 Presence of coronary angioplasty implant and graft: Secondary | ICD-10-CM

## 2016-09-28 DIAGNOSIS — I214 Non-ST elevation (NSTEMI) myocardial infarction: Secondary | ICD-10-CM

## 2016-09-28 NOTE — Progress Notes (Signed)
Daily Session Note  Patient Details  Name: Stephen Macdonald MRN: [id number] Date of Birth: 02/06/1946 Referring Provider:     [place] from 07/21/2016 in [place]  Referring Provider  Dr. Janese Banks      Encounter Date: 09/28/2016  Check In:     Session Check In - 09/28/16 0930      Check-In   Location [facility]   Staff Present Aundra Dubin, RN, BSN;Diane Coad, MS, EP, [facility], Exercise Physiologist;Gregory Luther Parody, BS, EP, Exercise Physiologist   Supervising physician immediately available to respond to emergencies See telemetry face sheet for immediately available MD   Medication changes reported     No   Fall or balance concerns reported    No   Tobacco Cessation No Change   Warm-up and Cool-down Performed as group-led instruction   Resistance Training Performed Yes   VAD Patient? No     Pain Assessment   Currently in Pain? No/denies   Pain Score 0-No pain   Multiple Pain Sites No      Capillary Blood Glucose: No results found for this or any previous visit (from the past 24 hour(s)).    History  Smoking Status  . Current Some Day Smoker  . Types: Cigars, Cigarettes  Smokeless Tobacco  . Not on file    Comment: Discussed cessation and shared we have classes that he could attend.     Goals Met:  Independence with exercise equipment Exercise tolerated well No report of cardiac concerns or symptoms Strength training completed today  Goals Unmet:  Not Applicable  Comments: Check out 1030.   Dr. Kate Sable is Medical Director for [facility] Cardiac and Pulmonary Rehab.

## 2016-09-30 ENCOUNTER — Encounter (HOSPITAL_COMMUNITY)
Admission: RE | Admit: 2016-09-30 | Discharge: 2016-09-30 | Disposition: A | Discharge status: Home / Self Care | Payer: Medicare Other | Source: Ambulatory Visit | Attending: Cardiology | Admitting: Cardiology

## 2016-09-30 DIAGNOSIS — I214 Non-ST elevation (NSTEMI) myocardial infarction: Secondary | ICD-10-CM

## 2016-09-30 DIAGNOSIS — Z955 Presence of coronary angioplasty implant and graft: Secondary | ICD-10-CM

## 2016-09-30 NOTE — Progress Notes (Signed)
Daily Session Note  Patient Details  Name: Stephen Macdonald MRN: [id number] Date of Birth: 18-Sep-1945 Referring Provider:     CARDIAC REHAB PHASE II ORIENTATION from 07/21/2016 in [place]  Referring Provider  Dr. Janese Banks      Encounter Date: 09/30/2016  Check In:     Session Check In - 09/30/16 0930      Check-In   Location [facility]   Staff Present Aundra Dubin, RN, BSN;Gregory Luther Parody, BS, EP, Exercise Physiologist   Supervising physician immediately available to respond to emergencies See telemetry face sheet for immediately available MD   Medication changes reported     No   Fall or balance concerns reported    No   Tobacco Cessation No Change   Warm-up and Cool-down Performed as group-led instruction   Resistance Training Performed Yes   VAD Patient? No     Pain Assessment   Currently in Pain? No/denies   Pain Score 0-No pain   Multiple Pain Sites No      Capillary Blood Glucose: No results found for this or any previous visit (from the past 24 hour(s)).      Exercise Prescription Changes - 09/29/16 1400      Response to Exercise   Blood Pressure (Admit) 116/66   Blood Pressure (Exercise) 118/62   Blood Pressure (Exit) 94/60   Heart Rate (Admit) 69 bpm   Heart Rate (Exercise) 99 bpm   Heart Rate (Exit) 79 bpm   Rating of Perceived Exertion (Exercise) 10   Duration Progress to 30 minutes of  aerobic without signs/symptoms of physical distress   Intensity THRR unchanged     Progression   Progression Continue to progress workloads to maintain intensity without signs/symptoms of physical distress.     Resistance Training   Training Prescription Yes   Weight 4   Reps 10-15     Treadmill   MPH 3.1   Grade 1   Minutes 15   METs 3.7     NuStep   Level 4   SPM 84   Minutes 20   METs 3.7     Home Exercise Plan   Plans to continue exercise at Home (comment)   Frequency Add 2 additional days to program exercise  sessions.      History  Smoking Status  . Current Some Day Smoker  . Types: Cigars, Cigarettes  Smokeless Tobacco  . Not on file    Comment: Discussed cessation and shared we have classes that he could attend.     Goals Met:  Independence with exercise equipment Exercise tolerated well No report of cardiac concerns or symptoms Strength training completed today  Goals Unmet:  Not Applicable  Comments: Check out 1030.   Dr. Kate Sable is Medical Director for [facility] Cardiac and Pulmonary Rehab.

## 2016-10-03 ENCOUNTER — Encounter (HOSPITAL_COMMUNITY)
Admission: RE | Admit: 2016-10-03 | Discharge: 2016-10-03 | Disposition: A | Discharge status: Home / Self Care | Payer: Medicare Other | Source: Ambulatory Visit | Attending: Cardiology | Admitting: Cardiology

## 2016-10-03 DIAGNOSIS — I214 Non-ST elevation (NSTEMI) myocardial infarction: Secondary | ICD-10-CM | POA: Diagnosis not present

## 2016-10-03 DIAGNOSIS — Z955 Presence of coronary angioplasty implant and graft: Secondary | ICD-10-CM

## 2016-10-03 NOTE — Progress Notes (Signed)
Daily Session Note  Patient Details  Name: Stephen Macdonald MRN: [id number] Date of Birth: March 23, 1946 Referring Provider:     [place] from 07/21/2016 in [place]  Referring Provider  Dr. Janese Banks      Encounter Date: 10/03/2016  Check In:     Session Check In - 10/03/16 0930      Check-In   Location [facility]   Staff Present Russella Dar, MS, EP, [facility], Exercise Physiologist;Kamila Broda Wynetta Emery, RN, BSN;Gregory Cowan, BS, EP, Exercise Physiologist   Supervising physician immediately available to respond to emergencies See telemetry face sheet for immediately available MD   Medication changes reported     No   Fall or balance concerns reported    No   Tobacco Cessation No Change   Warm-up and Cool-down Performed as group-led instruction   Resistance Training Performed Yes   VAD Patient? No     Pain Assessment   Currently in Pain? No/denies   Pain Score 0-No pain   Multiple Pain Sites No      Capillary Blood Glucose: No results found for this or any previous visit (from the past 24 hour(s)).    History  Smoking Status  . Current Some Day Smoker  . Types: Cigars, Cigarettes  Smokeless Tobacco  . Not on file    Comment: Discussed cessation and shared we have classes that he could attend.     Goals Met:  Independence with exercise equipment Exercise tolerated well No report of cardiac concerns or symptoms Strength training completed today  Goals Unmet:  Not Applicable  Comments: Check out 1030.   Dr. Kate Sable is Medical Director for [facility] Cardiac and Pulmonary Rehab.

## 2016-10-05 ENCOUNTER — Encounter (HOSPITAL_COMMUNITY)
Admission: RE | Admit: 2016-10-05 | Discharge: 2016-10-05 | Disposition: A | Discharge status: Home / Self Care | Payer: Medicare Other | Source: Ambulatory Visit | Attending: Cardiology | Admitting: Cardiology

## 2016-10-05 DIAGNOSIS — I214 Non-ST elevation (NSTEMI) myocardial infarction: Secondary | ICD-10-CM | POA: Diagnosis not present

## 2016-10-05 DIAGNOSIS — Z955 Presence of coronary angioplasty implant and graft: Secondary | ICD-10-CM

## 2016-10-05 NOTE — Progress Notes (Signed)
Daily Session Note  Patient Details  Name: Stephen Macdonald MRN: [id number] Date of Birth: 30-Jan-1946 Referring Provider:     CARDIAC REHAB PHASE II ORIENTATION from 07/21/2016 in [place]  Referring Provider  Dr. Janese Banks      Encounter Date: 10/05/2016  Check In:     Session Check In - 10/05/16 0930      Check-In   Location [facility]   Staff Present Russella Dar, MS, EP, [facility], Exercise Physiologist;Xaidyn Kepner Wynetta Emery, RN, BSN;Gregory Cowan, BS, EP, Exercise Physiologist   Supervising physician immediately available to respond to emergencies See telemetry face sheet for immediately available MD   Medication changes reported     No   Fall or balance concerns reported    No   Tobacco Cessation No Change   Warm-up and Cool-down Performed as group-led instruction   Resistance Training Performed Yes   VAD Patient? No     Pain Assessment   Currently in Pain? No/denies   Pain Score 0-No pain   Multiple Pain Sites No      Capillary Blood Glucose: No results found for this or any previous visit (from the past 24 hour(s)).    History  Smoking Status  . Current Some Day Smoker  . Types: Cigars, Cigarettes  Smokeless Tobacco  . Not on file    Comment: Discussed cessation and shared we have classes that he could attend.     Goals Met:  Independence with exercise equipment Exercise tolerated well No report of cardiac concerns or symptoms Strength training completed today  Goals Unmet:  Not Applicable  Comments: Check out 1030.   Dr. Kate Sable is Medical Director for [facility] Cardiac and Pulmonary Rehab.

## 2016-10-06 NOTE — Progress Notes (Signed)
Cardiac Individual Treatment Plan  Patient Details  Name: Stephen Macdonald MRN: [id number] Date of Birth: May 24, 1945 Referring Provider:     CARDIAC REHAB PHASE II ORIENTATION from 07/21/2016 in [place]  Referring Provider  Dr. Janese Banks      Initial Encounter Date:    CARDIAC REHAB PHASE II ORIENTATION from 07/21/2016 in [place]  Date  07/21/16  Referring Provider  Dr. Janese Banks      Visit Diagnosis: NSTEMI (non-ST elevated myocardial infarction) [facility])  Status post coronary artery stent placement  Patient's Home Medications on Admission:  Current Outpatient Prescriptions:  .  aspirin EC 81 MG tablet, Take 81 mg by mouth every morning. , Disp: , Rfl:  .  atorvastatin (LIPITOR) 80 MG tablet, Take 1 tablet (80 mg total) by mouth daily. (Patient taking differently: Take 80 mg by mouth at bedtime. ), Disp: 30 tablet, Rfl: 11 .  cholecalciferol (VITAMIN D) 1000 UNITS tablet, Take 1,000 Units by mouth at bedtime. , Disp: , Rfl:  .  furosemide (LASIX) 40 MG tablet, Take 1 tablet (40 mg total) by mouth daily. (Patient taking differently: Take 40 mg by mouth daily as needed. ), Disp: 30 tablet, Rfl: 3 .  metoprolol succinate (TOPROL-XL) 25 MG 24 hr tablet, Take 25 mg by mouth every morning., Disp: , Rfl:  .  nitroGLYCERIN (NITROSTAT) 0.4 MG SL tablet, Place 1 tablet (0.4 mg total) under the tongue every 5 (five) minutes as needed for chest pain., Disp: 25 tablet, Rfl: 3 .  potassium chloride 20 MEQ TBCR, Take 20 mEq by mouth daily. (Patient taking differently: Take 20 mEq by mouth daily as needed. ), Disp: 30 tablet, Rfl: 3 .  tamsulosin (FLOMAX) 0.4 MG CAPS capsule, Take 0.4 mg by mouth every morning., Disp: , Rfl:  .  ticagrelor (BRILINTA) 90 MG TABS tablet, Take 1 tablet (90 mg total) by mouth 2 (two) times daily., Disp: 60 tablet, Rfl: 6 .  valsartan (DIOVAN) 40 MG tablet, Take 40 mg by mouth at bedtime. , Disp: , Rfl:   Past Medical History: Past  Medical History:  Diagnosis Date  . Chronic combined systolic and diastolic CHF, NYHA class 3 ([place])    a. 01/2015 Echo: EF 35-30%; b. 03/2016 Echo: Ef 20%, Gr1DD, mid-apical anterior, mid anteroseptal, apical inferior, basal inferolateral, mid anterolateral, and apical AK, basal anteroseptal and mid inferior severe HK, midlly dil LA, mild TR.  . CKD (chronic kidney disease), stage III 04/15/2016  . Coronary artery disease    a. 1997 s/p CABG;  b. 01/2015 MI/PCI: G->PDA;  c. 03/2016 NSTEMI/PCI: LM 20, LAD 148m D2 80, RI 60, LCX ok, OM1 90, RCA 60p, 1070mRPAV 80, G->RPDA 15p ISR, 90d (3.5x28 Promus Premier DES), LIMA->OM1->dLAD nl.  . High cholesterol   . Hypertension   . Ischemic cardiomyopathy    a. 01/2015 Echo: EF 25-30%; b. s/p AICD;  c. 03/2016 Echo: Ef 20%, Gr1DD.  . Marland Kitchenmoker     Tobacco Use: History  Smoking Status  . Current Some Day Smoker  . Types: Cigars, Cigarettes  Smokeless Tobacco  . Not on file    Comment: Discussed cessation and shared we have classes that he could attend.     Labs: Recent Review Flowsheet Data    Labs for ITP Cardiac and Pulmonary Rehab Latest Ref Rng & Units 02/12/2015 04/16/2016   Cholestrol 0 - 200 mg/dL - 157   LDLCALC 0 - 99 mg/dL - 95   HDL >40  mg/dL - 43   Trlycerides <161 mg/dL - 96   Hemoglobin W9U 4.8 - 5.6 % - 5.6   TCO2 0 - 100 mmol/L 18 -      Capillary Blood Glucose: No results found for: GLUCAP   Exercise Target Goals:    Exercise Program Goal: Individual exercise prescription set with THRR, safety & activity barriers. Participant demonstrates ability to understand and report RPE using BORG scale, to self-measure pulse accurately, and to acknowledge the importance of the exercise prescription.  Exercise Prescription Goal: Starting with aerobic activity 30 plus minutes a day, 3 days per week for initial exercise prescription. Provide home exercise prescription and guidelines that participant acknowledges understanding  prior to discharge.  Activity Barriers & Risk Stratification:   6 Minute Walk:     6 Minute Walk    Row Name 07/21/16 0959         6 Minute Walk   Phase Initial     Distance 1700 feet     Distance % Change 0 %     Walk Time 6 minutes     # of Rest Breaks 0     MPH 3.21     METS 3.46     RPE 9     Perceived Dyspnea  9     VO2 Peak 11.54     Symptoms No     Resting HR 76 bpm     Resting BP 112/70     Max Ex. HR 100 bpm     Max Ex. BP 124/70     2 Minute Post BP 110/68        Oxygen Initial Assessment:   Oxygen Re-Evaluation:   Oxygen Discharge (Final Oxygen Re-Evaluation):   Initial Exercise Prescription:     Initial Exercise Prescription - 07/21/16 1000      Date of Initial Exercise RX and Referring Provider   Date 07/21/16   Referring Provider Dr. Smith Robert     Treadmill   MPH 2.5   Grade 0   Minutes 20   METs 2.9     NuStep   Level 2   SPM 25   Minutes 15   METs 2     Prescription Details   Frequency (times per week) 3   Duration Progress to 30 minutes of continuous aerobic without signs/symptoms of physical distress     Intensity   THRR 40-80% of Max Heartrate 105-120-134   Ratings of Perceived Exertion 11-13   Perceived Dyspnea 0-4     Progression   Progression Continue progressive overload as per policy without signs/symptoms or physical distress.     Resistance Training   Training Prescription Yes   Weight 1   Reps 10-15      Perform Capillary Blood Glucose checks as needed.  Exercise Prescription Changes:      Exercise Prescription Changes    Row Name 08/08/16 1400 09/02/16 1400 09/19/16 1400 09/29/16 1400       Response to Exercise   Blood Pressure (Admit) 110/70 112/60 110/60 116/66    Blood Pressure (Exercise) 100/60 130/68 120/60 118/62    Blood Pressure (Exit) 108/60 118/62 106/58 94/60    Heart Rate (Admit) 57 bpm 75 bpm 76 bpm 69 bpm    Heart Rate (Exercise) 116 bpm 90 bpm 103 bpm 99 bpm    Heart Rate (Exit) 66  bpm 83 bpm 85 bpm 79 bpm    Rating of Perceived Exertion (Exercise) 9 9 9  10  Duration Progress to 30 minutes of  aerobic without signs/symptoms of physical distress Progress to 30 minutes of  aerobic without signs/symptoms of physical distress Progress to 30 minutes of  aerobic without signs/symptoms of physical distress Progress to 30 minutes of  aerobic without signs/symptoms of physical distress    Intensity THRR unchanged THRR unchanged THRR unchanged THRR unchanged      Progression   Progression Continue to progress workloads to maintain intensity without signs/symptoms of physical distress. Continue to progress workloads to maintain intensity without signs/symptoms of physical distress. Continue to progress workloads to maintain intensity without signs/symptoms of physical distress. Continue to progress workloads to maintain intensity without signs/symptoms of physical distress.      Resistance Training   Training Prescription Yes Yes Yes Yes    Weight 2 3 4 4     Reps 10-15 10-15 10-15 10-15      Treadmill   MPH 2.7 3 3.3 3.1    Grade 0 0.5 0.5 1    Minutes 15 15 15 15     METs 3 3.5 3.4 3.7      NuStep   Level 3 3 4 4     SPM 84 118 140 84    Minutes 20 20 20 20     METs 3.7 3.7 4 3.7      Home Exercise Plan   Plans to continue exercise at Home (comment) Home (comment) Home (comment) Home (comment)    Frequency Add 2 additional days to program exercise sessions. Add 2 additional days to program exercise sessions. Add 2 additional days to program exercise sessions. Add 2 additional days to program exercise sessions.       Exercise Comments:      Exercise Comments    Row Name 08/08/16 1420 09/02/16 1444 09/19/16 1443 09/29/16 1458     Exercise Comments Patient is doing very well in CR.  Patient is progressing very well in CR.  Patient is doing well in CR.  Patient is doing well in CR.        Exercise Goals and Review:      Exercise Goals    Row Name 07/21/16 1134              Exercise Goals   Increase Physical Activity Yes       Intervention Provide advice, education, support and counseling about physical activity/exercise needs.;Develop an individualized exercise prescription for aerobic and resistive training based on initial evaluation findings, risk stratification, comorbidities and participant's personal goals.       Expected Outcomes Achievement of increased cardiorespiratory fitness and enhanced flexibility, muscular endurance and strength shown through measurements of functional capacity and personal statement of participant.       Increase Strength and Stamina Yes       Intervention Provide advice, education, support and counseling about physical activity/exercise needs.;Develop an individualized exercise prescription for aerobic and resistive training based on initial evaluation findings, risk stratification, comorbidities and participant's personal goals.       Expected Outcomes Achievement of increased cardiorespiratory fitness and enhanced flexibility, muscular endurance and strength shown through measurements of functional capacity and personal statement of participant.          Exercise Goals Re-Evaluation :     Exercise Goals Re-Evaluation    Row Name 08/10/16 1519 09/08/16 0816 10/06/16 0821         Exercise Goal Re-Evaluation   Exercise Goals Review Increase Physical Activity;Increase Strenth and Stamina  Get functional again.   Increase  Physical Activity;Increase Strenth and Stamina  Get functional again.  Increase Physical Activity;Increase Strenth and Stamina     Comments Patient has completed 9 sessions. He has progress well and says he has more energy to do things around the house.  After completing 20 sessions, patient says he is getting stronger and is doing more around the house. He said he was able to use a sledge hammer for the first time in years. He continues to progress and do well in the program.  After completing 29  sessions, patient's strength and stamina have increased. He continues to do more around the house and is able to take care of his grand-daughter without difficulty. He continues to progress well in the program.     Expected Outcomes Patient will complete the program with increased strength, stamina, and activity. Patient will complete the program with continued increased strength, stamina, and activity.  Patient will complete the program with continued increased strength, stamina, and activity.          Discharge Exercise Prescription (Final Exercise Prescription Changes):     Exercise Prescription Changes - 09/29/16 1400      Response to Exercise   Blood Pressure (Admit) 116/66   Blood Pressure (Exercise) 118/62   Blood Pressure (Exit) 94/60   Heart Rate (Admit) 69 bpm   Heart Rate (Exercise) 99 bpm   Heart Rate (Exit) 79 bpm   Rating of Perceived Exertion (Exercise) 10   Duration Progress to 30 minutes of  aerobic without signs/symptoms of physical distress   Intensity THRR unchanged     Progression   Progression Continue to progress workloads to maintain intensity without signs/symptoms of physical distress.     Resistance Training   Training Prescription Yes   Weight 4   Reps 10-15     Treadmill   MPH 3.1   Grade 1   Minutes 15   METs 3.7     NuStep   Level 4   SPM 84   Minutes 20   METs 3.7     Home Exercise Plan   Plans to continue exercise at Home (comment)   Frequency Add 2 additional days to program exercise sessions.      Nutrition:  Target Goals: Understanding of nutrition guidelines, daily intake of sodium 1500mg , cholesterol 200mg , calories 30% from fat and 7% or less from saturated fats, daily to have 5 or more servings of fruits and vegetables.  Biometrics:     Pre Biometrics - 07/21/16 1002      Pre Biometrics   Height 6\' 1"  (1.854 m)   Weight 239 lb 13.8 oz (108.8 kg)   Waist Circumference 41 inches   Hip Circumference 40.5 inches    Waist to Hip Ratio 1.01 %   BMI (Calculated) 31.7   Triceps Skinfold 8 mm   % Body Fat 26.5 %   Grip Strength 92.67 kg   Flexibility 10 in   Single Leg Stand 14 seconds       Nutrition Therapy Plan and Nutrition Goals:   Nutrition Discharge: Rate Your Plate Scores:   Nutrition Goals Re-Evaluation:   Nutrition Goals Discharge (Final Nutrition Goals Re-Evaluation):   Psychosocial: Target Goals: Acknowledge presence or absence of significant depression and/or stress, maximize coping skills, provide positive support system. Participant is able to verbalize types and ability to use techniques and skills needed for reducing stress and depression.  Initial Review & Psychosocial Screening:     Initial Psych Review & Screening - 07/21/16  1141      Initial Review   Current issues with Current Depression     Family Dynamics   Good Support System? Yes     Barriers   Psychosocial barriers to participate in program Psychosocial barriers identified (see note)  He is a little down because of his recent heart problems. He says he is feeling better.      Screening Interventions   Interventions Encouraged to exercise      Quality of Life Scores:     Quality of Life - 07/21/16 1002      Quality of Life Scores   Health/Function Pre 16.23 %   Socioeconomic Pre 24.75 %   Psych/Spiritual Pre 21.14 %   Family Pre 28.8 %   GLOBAL Pre 20.73 %      PHQ-9: Recent Review Flowsheet Data    Depression screen [facility] 2/9 07/21/2016 11/05/2015 06/16/2015   Decreased Interest 1 1 1    Down, Depressed, Hopeless 0 0 1   PHQ - 2 Score 1 1 2    Altered sleeping 0 0 0   Tired, decreased energy 2 1 0   Change in appetite 0 0 0   Feeling bad or failure about yourself  0 0 0   Trouble concentrating 0 0 0   Moving slowly or fidgety/restless 0 0 0   Suicidal thoughts 0 0 0   PHQ-9 Score 3 2 2    Difficult doing work/chores Somewhat difficult Somewhat difficult Somewhat difficult      Interpretation of Total Score  Total Score Depression Severity:  1-4 = Minimal depression, 5-9 = Mild depression, 10-14 = Moderate depression, 15-19 = Moderately severe depression, 20-27 = Severe depression   Psychosocial Evaluation and Intervention:     Psychosocial Evaluation - 07/21/16 1142      Psychosocial Evaluation & Interventions   Interventions Encouraged to exercise with the program and follow exercise prescription   Comments 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 Just a little depression.  His current health issues has him stressed. Involved in community game like dominoes. He also plays the guitar.    Continue Psychosocial Services  No Follow up required      Psychosocial Re-Evaluation:     Psychosocial Re-Evaluation    Row Name 07/21/16 1144 08/10/16 1521 09/08/16 0819 10/06/16 0824       Psychosocial Re-Evaluation   Current issues with Current Depression None Identified Current Depression None Identified    Comments  -  - Patient continues to have a positive outlook and good family support and is managing his depression. When patient started the program, he was feeling down d/t his recent heart event. After completing 29 sessions, he feels better, not having down days because he is able to do more.     Expected Outcomes  - Patient will have improved QOL and PHQ-9 scores at discharge.  Patient will have improved PHQ-9 and QOL scores at discharge.  Patient will have no psychosocial issues identified at discharge.     Interventions Encouraged to attend Pulmonary Rehabilitation for the exercise Encouraged to attend Cardiac Rehabilitation for the exercise  . Encouraged to attend Cardiac Rehabilitation for the exercise;Relaxation education;Stress management education Encouraged to attend Cardiac Rehabilitation for the exercise    Continue Psychosocial Services  No Follow up required No Follow up required No Follow up required No Follow up required       Psychosocial  Discharge (Final Psychosocial Re-Evaluation):     Psychosocial Re-Evaluation - 10/06/16 1610  Psychosocial Re-Evaluation   Current issues with None Identified   Comments When patient started the program, he was feeling down d/t his recent heart event. After completing 29 sessions, he feels better, not having down days because he is able to do more.    Expected Outcomes Patient will have no psychosocial issues identified at discharge.    Interventions Encouraged to attend Cardiac Rehabilitation for the exercise   Continue Psychosocial Services  No Follow up required      Vocational Rehabilitation: Provide vocational rehab assistance to qualifying candidates.   Vocational Rehab Evaluation & Intervention:     Vocational Rehab - 07/21/16 1130      Initial Vocational Rehab Evaluation & Intervention   Assessment shows need for Vocational Rehabilitation No      Education: Education Goals: Education classes will be provided on a weekly basis, covering required topics. Participant will state understanding/return demonstration of topics presented.  Learning Barriers/Preferences:     Learning Barriers/Preferences - 07/21/16 1129      Learning Barriers/Preferences   Learning Barriers None   Learning Preferences Skilled Demonstration;Individual Instruction;Group Instruction      Education Topics: Hypertension, Hypertension Reduction -Define heart disease and high blood pressure. Discus how high blood pressure affects the body and ways to reduce high blood pressure.   CARDIAC REHAB PHASE II EXERCISE from 08/17/2016 in [place] CARDIAC REHABILITATION  Date  08/17/16  Educator  Hart Rochester  Instruction Review Code  2- meets goals/outcomes      Exercise and Your Heart -Discuss why it is important to exercise, the FITT principles of exercise, normal and abnormal responses to exercise, and how to exercise safely.   CARDIAC REHAB PHASE II EXERCISE from 09/16/2015 in [place]  CARDIAC REHABILITATION  Date  08/26/15  Educator  Hart Rochester  Instruction Review Code  2- meets goals/outcomes      Angina -Discuss definition of angina, causes of angina, treatment of angina, and how to decrease risk of having angina.   CARDIAC REHAB PHASE II EXERCISE from 09/16/2015 in [place] CARDIAC REHABILITATION  Date  09/02/15  Educator  DCoad  Instruction Review Code  2- meets goals/outcomes      Cardiac Medications -Review what the following cardiac medications are used for, how they affect the body, and side effects that may occur when taking the medications.  Medications include Aspirin, Beta blockers, calcium channel blockers, ACE Inhibitors, angiotensin receptor blockers, diuretics, digoxin, and antihyperlipidemics.   Congestive Heart Failure -Discuss the definition of CHF, how to live with CHF, the signs and symptoms of CHF, and how keep track of weight and sodium intake.   CARDIAC REHAB PHASE II EXERCISE from 09/16/2015 in [place] [facility] CARDIAC REHABILITATION  Date  09/16/15  Educator  DC  Instruction Review Code  2- meets goals/outcomes      Heart Disease and Intimacy -Discus the effect sexual activity has on the heart, how changes occur during intimacy as we age, and safety during sexual activity.   CARDIAC REHAB PHASE II EXERCISE from 09/16/2015 in [place] CARDIAC REHABILITATION  Date  06/24/15  Educator  Hart Rochester  Instruction Review Code  2- meets goals/outcomes      Smoking Cessation / COPD -Discuss different methods to quit smoking, the health benefits of quitting smoking, and the definition of COPD.   CARDIAC REHAB PHASE II EXERCISE from 09/16/2015 in [place] CARDIAC REHABILITATION  Date  07/01/15  Educator  Hart Rochester  Instruction Review Code  2- meets goals/outcomes  Nutrition I: Fats -Discuss the types of cholesterol, what cholesterol does to the heart, and how cholesterol levels can be controlled.   CARDIAC REHAB PHASE II EXERCISE  from 09/16/2015 in [place] CARDIAC REHABILITATION  Date  07/08/15  Educator  Hart Rochester  Instruction Review Code  2- meets goals/outcomes      Nutrition II: Labels -Discuss the different components of food labels and how to read food label   CARDIAC REHAB PHASE II EXERCISE from 09/16/2015 in [place] CARDIAC REHABILITATION  Date  07/15/15  Educator  Hart Rochester  Instruction Review Code  2- meets goals/outcomes      Heart Parts and Heart Disease -Discuss the anatomy of the heart, the pathway of blood circulation through the heart, and these are affected by heart disease.   CARDIAC REHAB PHASE II EXERCISE from 09/16/2015 in [place] CARDIAC REHABILITATION  Date  07/22/15  Educator  Hart Rochester  Instruction Review Code  2- meets goals/outcomes      Stress I: Signs and Symptoms -Discuss the causes of stress, how stress may lead to anxiety and depression, and ways to limit stress.   CARDIAC REHAB PHASE II EXERCISE from 09/16/2015 in [place] CARDIAC REHABILITATION  Date  07/29/15  Educator  Hart Rochester  Instruction Review Code  2- meets goals/outcomes      Stress II: Relaxation -Discuss different types of relaxation techniques to limit stress.   CARDIAC REHAB PHASE II EXERCISE from 08/17/2016 in [place] [facility] CARDIAC REHABILITATION  Date  08/17/16      Warning Signs of Stroke / TIA -Discuss definition of a stroke, what the signs and symptoms are of a stroke, and how to identify when someone is having stroke.   CARDIAC REHAB PHASE II EXERCISE from 09/16/2015 in [place] CARDIAC REHABILITATION  Date  08/12/15  Educator  Hart Rochester  Instruction Review Code  2- meets goals/outcomes      Knowledge Questionnaire Score:     Knowledge Questionnaire Score - 07/21/16 1130      Knowledge Questionnaire Score   Pre Score 27/28      Core Components/Risk Factors/Patient Goals at Admission:     Personal Goals and Risk Factors at Admission - 07/21/16 1135      Core  Components/Risk Factors/Patient Goals on Admission    Weight Management Yes   Intervention Weight Management/Obesity: Establish reasonable short term and long term weight goals.   Admit Weight 240 lb (108.9 kg)   Goal Weight: Short Term 235 lb (106.6 kg)   Goal Weight: Long Term 230 lb (104.3 kg)   Expected Outcomes Short Term: Continue to assess and modify interventions until short term weight is achieved;Long Term: Adherence to nutrition and physical activity/exercise program aimed toward attainment of established weight goal   Tobacco Cessation Yes   Number of packs per day --  he has cut down to 1 cigar/day   Intervention Assist the participant in steps to quit. Provide individualized education and counseling about committing to Tobacco Cessation, relapse prevention, and pharmacological support that can be provided by physician.   Expected Outcomes Short Term: Will demonstrate readiness to quit, by selecting a quit date.;Long Term: Complete abstinence from all tobacco products for at least 12 months from quit date.   Lipids --  They are now under control   Personal Goal Other Yes   Personal Goal Regain his funcational capacity. Lose 10 lbs.    Intervention Attend CR 3xweek and supplement exercise at home 2 x week.  Expected Outcomes Achieve personal goals.       Core Components/Risk Factors/Patient Goals Review:      Goals and Risk Factor Review    Row Name 07/21/16 1140 08/10/16 1518 09/08/16 0815 10/06/16 0819       Core Components/Risk Factors/Patient Goals Review   Personal Goals Review Weight Management/Obesity Weight Management/Obesity Weight Management/Obesity  Lose 10 lbs.  Weight Management/Obesity  Lose 10 lbs.  Get functional again.    Review  - Patient has completed 9 sessions losing 4 lbs.  Patient has completed 20 sessions losing 2 lbs. He continues to do well in the program and says he feels better since he started.  Patient has completed 29 sessions losing 6 lbs.  He continues to do well in the program meeting his weight loss goal.     Expected Outcomes  - Patient will complete the program meeting his personal goals.  Patient will complete the program meeting his weight loss goal.  Patient will complete the program and meet his weight loss goal.        Core Components/Risk Factors/Patient Goals at Discharge (Final Review):      Goals and Risk Factor Review - 10/06/16 0819      Core Components/Risk Factors/Patient Goals Review   Personal Goals Review Weight Management/Obesity  Lose 10 lbs.  Get functional again.   Review Patient has completed 29 sessions losing 6 lbs. He continues to do well in the program meeting his weight loss goal.    Expected Outcomes Patient will complete the program and meet his weight loss goal.       ITP Comments:     ITP Comments    Row Name 07/21/16 1419           ITP Comments Patient new to program. Plans to start Monday 07/25/16.          Comments: ITP 30 Day REVIEW Patient doing well in the program. Will continue to monitor for progress.

## 2016-10-07 ENCOUNTER — Encounter (HOSPITAL_COMMUNITY)
Admission: RE | Admit: 2016-10-07 | Discharge: 2016-10-07 | Disposition: A | Discharge status: Home / Self Care | Payer: Medicare Other | Source: Ambulatory Visit | Attending: Cardiology | Admitting: Cardiology

## 2016-10-07 DIAGNOSIS — I214 Non-ST elevation (NSTEMI) myocardial infarction: Secondary | ICD-10-CM | POA: Diagnosis not present

## 2016-10-10 ENCOUNTER — Encounter (HOSPITAL_COMMUNITY)
Admission: RE | Admit: 2016-10-10 | Discharge: 2016-10-10 | Disposition: A | Discharge status: Home / Self Care | Payer: Medicare Other | Source: Ambulatory Visit | Attending: Cardiology | Admitting: Cardiology

## 2016-10-10 DIAGNOSIS — I214 Non-ST elevation (NSTEMI) myocardial infarction: Secondary | ICD-10-CM | POA: Diagnosis not present

## 2016-10-10 DIAGNOSIS — Z955 Presence of coronary angioplasty implant and graft: Secondary | ICD-10-CM

## 2016-10-10 NOTE — Progress Notes (Signed)
Daily Session Note  Patient Details  Name: Stephen Macdonald MRN: [id number] Date of Birth: 08/04/1945 Referring Provider:     [facility] from 07/21/2016 in [place]  Referring Provider  Dr. Janese Banks      Encounter Date: 10/10/2016  Check In:     Session Check In - 10/10/16 0930      Check-In   Location [facility]   Staff Present Aundra Dubin, RN, BSN;Gregory Luther Parody, BS, EP, Exercise Physiologist   Supervising physician immediately available to respond to emergencies See telemetry face sheet for immediately available MD   Medication changes reported     No   Fall or balance concerns reported    No   Tobacco Cessation No Change   Warm-up and Cool-down Performed as group-led instruction   Resistance Training Performed Yes   VAD Patient? No     Pain Assessment   Currently in Pain? No/denies   Pain Score 0-No pain   Multiple Pain Sites No      Capillary Blood Glucose: No results found for this or any previous visit (from the past 24 hour(s)).    History  Smoking Status  . Current Some Day Smoker  . Types: Cigars, Cigarettes  Smokeless Tobacco  . Not on file    Comment: Discussed cessation and shared we have classes that he could attend.     Goals Met:  Independence with exercise equipment Exercise tolerated well No report of cardiac concerns or symptoms Strength training completed today  Goals Unmet:  Not Applicable  Comments: Check out 1030.   Dr. Kate Sable is Medical Director for [facility].

## 2016-10-12 ENCOUNTER — Encounter (HOSPITAL_COMMUNITY)
Admission: RE | Admit: 2016-10-12 | Discharge: 2016-10-12 | Disposition: A | Discharge status: Home / Self Care | Payer: Medicare Other | Source: Ambulatory Visit | Attending: Cardiology | Admitting: Cardiology

## 2016-10-12 DIAGNOSIS — Z955 Presence of coronary angioplasty implant and graft: Secondary | ICD-10-CM

## 2016-10-12 DIAGNOSIS — I214 Non-ST elevation (NSTEMI) myocardial infarction: Secondary | ICD-10-CM

## 2016-10-12 NOTE — Progress Notes (Signed)
Daily Session Note  Patient Details  Name: Stephen Macdonald MRN: [id number] Date of Birth: 12-07-1945 Referring Provider:     [facility] from 07/21/2016 in [place]  Referring Provider  Dr. Janese Banks      Encounter Date: 10/12/2016  Check In:     Session Check In - 10/12/16 0925      Check-In   Location [facility]   Staff Present Russella Dar, MS, EP, [facility], Exercise Physiologist;Cleophas Yoak Wynetta Emery, RN, BSN;Gregory Cowan, BS, EP, Exercise Physiologist   Supervising physician immediately available to respond to emergencies See telemetry face sheet for immediately available MD   Medication changes reported     No   Fall or balance concerns reported    No   Tobacco Cessation No Change   Warm-up and Cool-down Performed as group-led instruction   Resistance Training Performed Yes   VAD Patient? No     Pain Assessment   Currently in Pain? No/denies   Pain Score 0-No pain   Multiple Pain Sites No      Capillary Blood Glucose: No results found for this or any previous visit (from the past 24 hour(s)).    History  Smoking Status  . Current Some Day Smoker  . Types: Cigars, Cigarettes  Smokeless Tobacco  . Not on file    Comment: Discussed cessation and shared we have classes that he could attend.     Goals Met:  Independence with exercise equipment Exercise tolerated well No report of cardiac concerns or symptoms Strength training completed today  Goals Unmet:  Not Applicable  Comments: Check out 1030.   Dr. Kate Sable is Medical Director for [facility].

## 2016-10-14 ENCOUNTER — Encounter (HOSPITAL_COMMUNITY): Payer: Medicare Other

## 2016-10-17 ENCOUNTER — Encounter (HOSPITAL_COMMUNITY)
Admission: RE | Admit: 2016-10-17 | Discharge: 2016-10-17 | Disposition: A | Discharge status: Home / Self Care | Payer: Medicare Other | Source: Ambulatory Visit | Attending: Cardiology | Admitting: Cardiology

## 2016-10-17 DIAGNOSIS — Z955 Presence of coronary angioplasty implant and graft: Secondary | ICD-10-CM | POA: Insufficient documentation

## 2016-10-17 DIAGNOSIS — I5042 Chronic combined systolic (congestive) and diastolic (congestive) heart failure: Secondary | ICD-10-CM | POA: Insufficient documentation

## 2016-10-17 DIAGNOSIS — Z79899 Other long term (current) drug therapy: Secondary | ICD-10-CM | POA: Diagnosis not present

## 2016-10-17 DIAGNOSIS — N183 Chronic kidney disease, stage 3 (moderate): Secondary | ICD-10-CM | POA: Diagnosis not present

## 2016-10-17 DIAGNOSIS — E7800 Pure hypercholesterolemia, unspecified: Secondary | ICD-10-CM | POA: Diagnosis not present

## 2016-10-17 DIAGNOSIS — I214 Non-ST elevation (NSTEMI) myocardial infarction: Secondary | ICD-10-CM | POA: Diagnosis present

## 2016-10-17 DIAGNOSIS — I2510 Atherosclerotic heart disease of native coronary artery without angina pectoris: Secondary | ICD-10-CM | POA: Diagnosis not present

## 2016-10-17 DIAGNOSIS — F17210 Nicotine dependence, cigarettes, uncomplicated: Secondary | ICD-10-CM | POA: Insufficient documentation

## 2016-10-17 DIAGNOSIS — I130 Hypertensive heart and chronic kidney disease with heart failure and stage 1 through stage 4 chronic kidney disease, or unspecified chronic kidney disease: Secondary | ICD-10-CM | POA: Insufficient documentation

## 2016-10-17 DIAGNOSIS — Z7982 Long term (current) use of aspirin: Secondary | ICD-10-CM | POA: Diagnosis not present

## 2016-10-19 ENCOUNTER — Encounter (HOSPITAL_COMMUNITY): Payer: Medicare Other

## 2016-10-20 NOTE — Progress Notes (Signed)
Cardiac Individual Treatment Plan  Patient Details  Name: Stephen Macdonald MRN: [id number] Date of Birth: May 24, 1945 Referring Provider:     CARDIAC REHAB PHASE II ORIENTATION from 07/21/2016 in [place]  Referring Provider  Dr. Janese Banks      Initial Encounter Date:    CARDIAC REHAB PHASE II ORIENTATION from 07/21/2016 in [place]  Date  07/21/16  Referring Provider  Dr. Janese Banks      Visit Diagnosis: NSTEMI (non-ST elevated myocardial infarction) [facility])  Status post coronary artery stent placement  Patient's Home Medications on Admission:  Current Outpatient Prescriptions:  .  aspirin EC 81 MG tablet, Take 81 mg by mouth every morning. , Disp: , Rfl:  .  atorvastatin (LIPITOR) 80 MG tablet, Take 1 tablet (80 mg total) by mouth daily. (Patient taking differently: Take 80 mg by mouth at bedtime. ), Disp: 30 tablet, Rfl: 11 .  cholecalciferol (VITAMIN D) 1000 UNITS tablet, Take 1,000 Units by mouth at bedtime. , Disp: , Rfl:  .  furosemide (LASIX) 40 MG tablet, Take 1 tablet (40 mg total) by mouth daily. (Patient taking differently: Take 40 mg by mouth daily as needed. ), Disp: 30 tablet, Rfl: 3 .  metoprolol succinate (TOPROL-XL) 25 MG 24 hr tablet, Take 25 mg by mouth every morning., Disp: , Rfl:  .  nitroGLYCERIN (NITROSTAT) 0.4 MG SL tablet, Place 1 tablet (0.4 mg total) under the tongue every 5 (five) minutes as needed for chest pain., Disp: 25 tablet, Rfl: 3 .  potassium chloride 20 MEQ TBCR, Take 20 mEq by mouth daily. (Patient taking differently: Take 20 mEq by mouth daily as needed. ), Disp: 30 tablet, Rfl: 3 .  tamsulosin (FLOMAX) 0.4 MG CAPS capsule, Take 0.4 mg by mouth every morning., Disp: , Rfl:  .  ticagrelor (BRILINTA) 90 MG TABS tablet, Take 1 tablet (90 mg total) by mouth 2 (two) times daily., Disp: 60 tablet, Rfl: 6 .  valsartan (DIOVAN) 40 MG tablet, Take 40 mg by mouth at bedtime. , Disp: , Rfl:   Past Medical History: Past  Medical History:  Diagnosis Date  . Chronic combined systolic and diastolic CHF, NYHA class 3 ([place])    a. 01/2015 Echo: EF 35-30%; b. 03/2016 Echo: Ef 20%, Gr1DD, mid-apical anterior, mid anteroseptal, apical inferior, basal inferolateral, mid anterolateral, and apical AK, basal anteroseptal and mid inferior severe HK, midlly dil LA, mild TR.  . CKD (chronic kidney disease), stage III 04/15/2016  . Coronary artery disease    a. 1997 s/p CABG;  b. 01/2015 MI/PCI: G->PDA;  c. 03/2016 NSTEMI/PCI: LM 20, LAD 148m D2 80, RI 60, LCX ok, OM1 90, RCA 60p, 1070mRPAV 80, G->RPDA 15p ISR, 90d (3.5x28 Promus Premier DES), LIMA->OM1->dLAD nl.  . High cholesterol   . Hypertension   . Ischemic cardiomyopathy    a. 01/2015 Echo: EF 25-30%; b. s/p AICD;  c. 03/2016 Echo: Ef 20%, Gr1DD.  . Marland Kitchenmoker     Tobacco Use: History  Smoking Status  . Current Some Day Smoker  . Types: Cigars, Cigarettes  Smokeless Tobacco  . Not on file    Comment: Discussed cessation and shared we have classes that he could attend.     Labs: Recent Review Flowsheet Data    Labs for ITP Cardiac and Pulmonary Rehab Latest Ref Rng & Units 02/12/2015 04/16/2016   Cholestrol 0 - 200 mg/dL - 157   LDLCALC 0 - 99 mg/dL - 95   HDL >40  mg/dL - 43   Trlycerides <403 mg/dL - 96   Hemoglobin K7Q 4.8 - 5.6 % - 5.6   TCO2 0 - 100 mmol/L 18 -      Capillary Blood Glucose: No results found for: GLUCAP   Exercise Target Goals:    Exercise Program Goal: Individual exercise prescription set with THRR, safety & activity barriers. Participant demonstrates ability to understand and report RPE using BORG scale, to self-measure pulse accurately, and to acknowledge the importance of the exercise prescription.  Exercise Prescription Goal: Starting with aerobic activity 30 plus minutes a day, 3 days per week for initial exercise prescription. Provide home exercise prescription and guidelines that participant acknowledges understanding  prior to discharge.  Activity Barriers & Risk Stratification:   6 Minute Walk:     6 Minute Walk    Row Name 07/21/16 0959         6 Minute Walk   Phase Initial     Distance 1700 feet     Distance % Change 0 %     Walk Time 6 minutes     # of Rest Breaks 0     MPH 3.21     METS 3.46     RPE 9     Perceived Dyspnea  9     VO2 Peak 11.54     Symptoms No     Resting HR 76 bpm     Resting BP 112/70     Max Ex. HR 100 bpm     Max Ex. BP 124/70     2 Minute Post BP 110/68        Oxygen Initial Assessment:   Oxygen Re-Evaluation:   Oxygen Discharge (Final Oxygen Re-Evaluation):   Initial Exercise Prescription:     Initial Exercise Prescription - 07/21/16 1000      Date of Initial Exercise RX and Referring Provider   Date 07/21/16   Referring Provider Dr. Smith Robert     Treadmill   MPH 2.5   Grade 0   Minutes 20   METs 2.9     NuStep   Level 2   SPM 25   Minutes 15   METs 2     Prescription Details   Frequency (times per week) 3   Duration Progress to 30 minutes of continuous aerobic without signs/symptoms of physical distress     Intensity   THRR 40-80% of Max Heartrate 105-120-134   Ratings of Perceived Exertion 11-13   Perceived Dyspnea 0-4     Progression   Progression Continue progressive overload as per policy without signs/symptoms or physical distress.     Resistance Training   Training Prescription Yes   Weight 1   Reps 10-15      Perform Capillary Blood Glucose checks as needed.  Exercise Prescription Changes:     Exercise Prescription Changes    Row Name 08/08/16 1400 09/02/16 1400 09/19/16 1400 09/29/16 1400 10/17/16 1400     Response to Exercise   Blood Pressure (Admit) 110/70 112/60 110/60 116/66 102/60   Blood Pressure (Exercise) 100/60 130/68 120/60 118/62 166/66   Blood Pressure (Exit) 108/60 118/62 106/58 94/60 96/58    Heart Rate (Admit) 57 bpm 75 bpm 76 bpm 69 bpm 66 bpm   Heart Rate (Exercise) 116 bpm 90 bpm 103 bpm  99 bpm 106 bpm   Heart Rate (Exit) 66 bpm 83 bpm 85 bpm 79 bpm 73 bpm   Rating of Perceived Exertion (Exercise) 9 9 9 10  10  Duration Progress to 30 minutes of  aerobic without signs/symptoms of physical distress Progress to 30 minutes of  aerobic without signs/symptoms of physical distress Progress to 30 minutes of  aerobic without signs/symptoms of physical distress Progress to 30 minutes of  aerobic without signs/symptoms of physical distress Progress to 30 minutes of  aerobic without signs/symptoms of physical distress   Intensity THRR unchanged THRR unchanged THRR unchanged THRR unchanged THRR unchanged     Progression   Progression Continue to progress workloads to maintain intensity without signs/symptoms of physical distress. Continue to progress workloads to maintain intensity without signs/symptoms of physical distress. Continue to progress workloads to maintain intensity without signs/symptoms of physical distress. Continue to progress workloads to maintain intensity without signs/symptoms of physical distress. Continue to progress workloads to maintain intensity without signs/symptoms of physical distress.     Resistance Training   Training Prescription Yes Yes Yes Yes Yes   Weight 2 3 4 4 4    Reps 10-15 10-15 10-15 10-15 10-15     Treadmill   MPH 2.7 3 3.3 3.1 3.2   Grade 0 0.5 0.5 1 1    Minutes 15 15 15 15 15    METs 3 3.5 3.4 3.7 3.8     NuStep   Level 3 3 4 4 4    SPM 84 118 140 84 54   Minutes 20 20 20 20 20    METs 3.7 3.7 4 3.7 2.3     Home Exercise Plan   Plans to continue exercise at Home (comment) Home (comment) Home (comment) Home (comment) Home (comment)   Frequency Add 2 additional days to program exercise sessions. Add 2 additional days to program exercise sessions. Add 2 additional days to program exercise sessions. Add 2 additional days to program exercise sessions. Add 2 additional days to program exercise sessions.      Exercise Comments:     Exercise  Comments    Row Name 08/08/16 1420 09/02/16 1444 09/19/16 1443 09/29/16 1458 10/17/16 1430   Exercise Comments Patient is doing very well in CR.  Patient is progressing very well in CR.  Patient is doing well in CR.  Patient is doing well in CR.  Patient is doingh very well in CR.       Exercise Goals and Review:     Exercise Goals    Row Name 07/21/16 1134             Exercise Goals   Increase Physical Activity Yes       Intervention Provide advice, education, support and counseling about physical activity/exercise needs.;Develop an individualized exercise prescription for aerobic and resistive training based on initial evaluation findings, risk stratification, comorbidities and participant's personal goals.       Expected Outcomes Achievement of increased cardiorespiratory fitness and enhanced flexibility, muscular endurance and strength shown through measurements of functional capacity and personal statement of participant.       Increase Strength and Stamina Yes       Intervention Provide advice, education, support and counseling about physical activity/exercise needs.;Develop an individualized exercise prescription for aerobic and resistive training based on initial evaluation findings, risk stratification, comorbidities and participant's personal goals.       Expected Outcomes Achievement of increased cardiorespiratory fitness and enhanced flexibility, muscular endurance and strength shown through measurements of functional capacity and personal statement of participant.          Exercise Goals Re-Evaluation :     Exercise Goals Re-Evaluation    Row  Name 08/10/16 1519 09/08/16 0816 10/06/16 0821 10/20/16 1635       Exercise Goal Re-Evaluation   Exercise Goals Review Increase Physical Activity;Increase Strenth and Stamina  Get functional again.   Increase Physical Activity;Increase Strenth and Stamina  Get functional again.  Increase Physical Activity;Increase Strenth and  Stamina Increase Physical Activity;Increase Strenth and Stamina    Comments Patient has completed 9 sessions. He has progress well and says he has more energy to do things around the house.  After completing 20 sessions, patient says he is getting stronger and is doing more around the house. He said he was able to use a sledge hammer for the first time in years. He continues to progress and do well in the program.  After completing 29 sessions, patient's strength and stamina have increased. He continues to do more around the house and is able to take care of his grand-daughter without difficulty. He continues to progress well in the program. Patient has completed 33 sessions with increased strength, stamina, and activity. He continues to progress and do well.     Expected Outcomes Patient will complete the program with increased strength, stamina, and activity. Patient will complete the program with continued increased strength, stamina, and activity.  Patient will complete the program with continued increased strength, stamina, and activity.  Patient will complete the program with continued increased strength, stamina, and activity.        Discharge Exercise Prescription (Final Exercise Prescription Changes):     Exercise Prescription Changes - 10/17/16 1400      Response to Exercise   Blood Pressure (Admit) 102/60   Blood Pressure (Exercise) 166/66   Blood Pressure (Exit) 96/58   Heart Rate (Admit) 66 bpm   Heart Rate (Exercise) 106 bpm   Heart Rate (Exit) 73 bpm   Rating of Perceived Exertion (Exercise) 10   Duration Progress to 30 minutes of  aerobic without signs/symptoms of physical distress   Intensity THRR unchanged     Progression   Progression Continue to progress workloads to maintain intensity without signs/symptoms of physical distress.     Resistance Training   Training Prescription Yes   Weight 4   Reps 10-15     Treadmill   MPH 3.2   Grade 1   Minutes 15   METs 3.8      NuStep   Level 4   SPM 54   Minutes 20   METs 2.3     Home Exercise Plan   Plans to continue exercise at Home (comment)   Frequency Add 2 additional days to program exercise sessions.      Nutrition:  Target Goals: Understanding of nutrition guidelines, daily intake of sodium 1500mg , cholesterol 200mg , calories 30% from fat and 7% or less from saturated fats, daily to have 5 or more servings of fruits and vegetables.  Biometrics:     Pre Biometrics - 07/21/16 1002      Pre Biometrics   Height 6\' 1"  (1.854 m)   Weight 239 lb 13.8 oz (108.8 kg)   Waist Circumference 41 inches   Hip Circumference 40.5 inches   Waist to Hip Ratio 1.01 %   BMI (Calculated) 31.7   Triceps Skinfold 8 mm   % Body Fat 26.5 %   Grip Strength 92.67 kg   Flexibility 10 in   Single Leg Stand 14 seconds       Nutrition Therapy Plan and Nutrition Goals:   Nutrition Discharge: Rate Your Plate Scores:  Nutrition Goals Re-Evaluation:   Nutrition Goals Discharge (Final Nutrition Goals Re-Evaluation):   Psychosocial: Target Goals: Acknowledge presence or absence of significant depression and/or stress, maximize coping skills, provide positive support system. Participant is able to verbalize types and ability to use techniques and skills needed for reducing stress and depression.  Initial Review & Psychosocial Screening:     Initial Psych Review & Screening - 07/21/16 1141      Initial Review   Current issues with Current Depression     Family Dynamics   Good Support System? Yes     Barriers   Psychosocial barriers to participate in program Psychosocial barriers identified (see note)  He is a little down because of his recent heart problems. He says he is feeling better.      Screening Interventions   Interventions Encouraged to exercise      Quality of Life Scores:     Quality of Life - 07/21/16 1002      Quality of Life Scores   Health/Function Pre 16.23 %    Socioeconomic Pre 24.75 %   Psych/Spiritual Pre 21.14 %   Family Pre 28.8 %   GLOBAL Pre 20.73 %      PHQ-9: Recent Review Flowsheet Data    Depression screen [facility] 2/9 07/21/2016 11/05/2015 06/16/2015   Decreased Interest 1 1 1    Down, Depressed, Hopeless 0 0 1   PHQ - 2 Score 1 1 2    Altered sleeping 0 0 0   Tired, decreased energy 2 1 0   Change in appetite 0 0 0   Feeling bad or failure about yourself  0 0 0   Trouble concentrating 0 0 0   Moving slowly or fidgety/restless 0 0 0   Suicidal thoughts 0 0 0   PHQ-9 Score 3 2 2    Difficult doing work/chores Somewhat difficult Somewhat difficult Somewhat difficult     Interpretation of Total Score  Total Score Depression Severity:  1-4 = Minimal depression, 5-9 = Mild depression, 10-14 = Moderate depression, 15-19 = Moderately severe depression, 20-27 = Severe depression   Psychosocial Evaluation and Intervention:     Psychosocial Evaluation - 07/21/16 1142      Psychosocial Evaluation & Interventions   Interventions Encouraged to exercise with the program and follow exercise prescription   Comments 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 Just a little depression.  His current health issues has him stressed. Involved in community game like dominoes. He also plays the guitar.    Continue Psychosocial Services  No Follow up required      Psychosocial Re-Evaluation:     Psychosocial Re-Evaluation    Row Name 07/21/16 1144 08/10/16 1521 09/08/16 0819 10/06/16 0824 10/20/16 1637     Psychosocial Re-Evaluation   Current issues with Current Depression None Identified Current Depression None Identified None Identified   Comments  -  - Patient continues to have a positive outlook and good family support and is managing his depression. When patient started the program, he was feeling down d/t his recent heart event. After completing 29 sessions, he feels better, not having down days because he is able to do more.  Patient  continues to have no psychosocial issues identified.    Expected Outcomes  - Patient will have improved QOL and PHQ-9 scores at discharge.  Patient will have improved PHQ-9 and QOL scores at discharge.  Patient will have no psychosocial issues identified at discharge.  Patient will have no psychosocial issues identified at  discharge.    Interventions Encouraged to attend Pulmonary Rehabilitation for the exercise Encouraged to attend Cardiac Rehabilitation for the exercise  . Encouraged to attend Cardiac Rehabilitation for the exercise;Relaxation education;Stress management education Encouraged to attend Cardiac Rehabilitation for the exercise Encouraged to attend Cardiac Rehabilitation for the exercise;Stress management education;Relaxation education   Continue Psychosocial Services  No Follow up required No Follow up required No Follow up required No Follow up required Follow up required by staff      Psychosocial Discharge (Final Psychosocial Re-Evaluation):     Psychosocial Re-Evaluation - 10/20/16 1637      Psychosocial Re-Evaluation   Current issues with None Identified   Comments Patient continues to have no psychosocial issues identified.    Expected Outcomes Patient will have no psychosocial issues identified at discharge.    Interventions Encouraged to attend Cardiac Rehabilitation for the exercise;Stress management education;Relaxation education   Continue Psychosocial Services  Follow up required by staff      Vocational Rehabilitation: Provide vocational rehab assistance to qualifying candidates.   Vocational Rehab Evaluation & Intervention:     Vocational Rehab - 07/21/16 1130      Initial Vocational Rehab Evaluation & Intervention   Assessment shows need for Vocational Rehabilitation No      Education: Education Goals: Education classes will be provided on a weekly basis, covering required topics. Participant will state understanding/return demonstration of topics  presented.  Learning Barriers/Preferences:     Learning Barriers/Preferences - 07/21/16 1129      Learning Barriers/Preferences   Learning Barriers None   Learning Preferences Skilled Demonstration;Individual Instruction;Group Instruction      Education Topics: Hypertension, Hypertension Reduction -Define heart disease and high blood pressure. Discus how high blood pressure affects the body and ways to reduce high blood pressure.   CARDIAC REHAB PHASE II EXERCISE from 08/17/2016 in [place] CARDIAC REHABILITATION  Date  08/17/16  Educator  Hart Rochester  Instruction Review Code  2- meets goals/outcomes      Exercise and Your Heart -Discuss why it is important to exercise, the FITT principles of exercise, normal and abnormal responses to exercise, and how to exercise safely.   CARDIAC REHAB PHASE II EXERCISE from 09/16/2015 in [place] CARDIAC REHABILITATION  Date  08/26/15  Educator  Hart Rochester  Instruction Review Code  2- meets goals/outcomes      Angina -Discuss definition of angina, causes of angina, treatment of angina, and how to decrease risk of having angina.   CARDIAC REHAB PHASE II EXERCISE from 09/16/2015 in [place] CARDIAC REHABILITATION  Date  09/02/15  Educator  DCoad  Instruction Review Code  2- meets goals/outcomes      Cardiac Medications -Review what the following cardiac medications are used for, how they affect the body, and side effects that may occur when taking the medications.  Medications include Aspirin, Beta blockers, calcium channel blockers, ACE Inhibitors, angiotensin receptor blockers, diuretics, digoxin, and antihyperlipidemics.   Congestive Heart Failure -Discuss the definition of CHF, how to live with CHF, the signs and symptoms of CHF, and how keep track of weight and sodium intake.   CARDIAC REHAB PHASE II EXERCISE from 09/16/2015 in [place] [facility]  Date  09/16/15  Educator  DC  Instruction Review Code  2- meets  goals/outcomes      Heart Disease and Intimacy -Discus the effect sexual activity has on the heart, how changes occur during intimacy as we age, and safety during sexual activity.   CARDIAC REHAB  PHASE II EXERCISE from 09/16/2015 in [place] CARDIAC REHABILITATION  Date  06/24/15  Educator  Hart Rochester  Instruction Review Code  2- meets goals/outcomes      Smoking Cessation / COPD -Discuss different methods to quit smoking, the health benefits of quitting smoking, and the definition of COPD.   CARDIAC REHAB PHASE II EXERCISE from 09/16/2015 in [place] CARDIAC REHABILITATION  Date  07/01/15  Educator  Hart Rochester  Instruction Review Code  2- meets goals/outcomes      Nutrition I: Fats -Discuss the types of cholesterol, what cholesterol does to the heart, and how cholesterol levels can be controlled.   CARDIAC REHAB PHASE II EXERCISE from 09/16/2015 in [place] CARDIAC REHABILITATION  Date  07/08/15  Educator  Hart Rochester  Instruction Review Code  2- meets goals/outcomes      Nutrition II: Labels -Discuss the different components of food labels and how to read food label   CARDIAC REHAB PHASE II EXERCISE from 09/16/2015 in [place] CARDIAC REHABILITATION  Date  07/15/15  Educator  Hart Rochester  Instruction Review Code  2- meets goals/outcomes      Heart Parts and Heart Disease -Discuss the anatomy of the heart, the pathway of blood circulation through the heart, and these are affected by heart disease.   CARDIAC REHAB PHASE II EXERCISE from 09/16/2015 in [place] CARDIAC REHABILITATION  Date  07/22/15  Educator  Hart Rochester  Instruction Review Code  2- meets goals/outcomes      Stress I: Signs and Symptoms -Discuss the causes of stress, how stress may lead to anxiety and depression, and ways to limit stress.   CARDIAC REHAB PHASE II EXERCISE from 09/16/2015 in [place] CARDIAC REHABILITATION  Date  07/29/15  Educator  Hart Rochester  Instruction Review Code  2- meets  goals/outcomes      Stress II: Relaxation -Discuss different types of relaxation techniques to limit stress.   CARDIAC REHAB PHASE II EXERCISE from 08/17/2016 in [place] [facility]  Date  08/17/16      Warning Signs of Stroke / TIA -Discuss definition of a stroke, what the signs and symptoms are of a stroke, and how to identify when someone is having stroke.   CARDIAC REHAB PHASE II EXERCISE from 09/16/2015 in [place] CARDIAC REHABILITATION  Date  08/12/15  Educator  Hart Rochester  Instruction Review Code  2- meets goals/outcomes      Knowledge Questionnaire Score:     Knowledge Questionnaire Score - 07/21/16 1130      Knowledge Questionnaire Score   Pre Score 27/28      Core Components/Risk Factors/Patient Goals at Admission:     Personal Goals and Risk Factors at Admission - 07/21/16 1135      Core Components/Risk Factors/Patient Goals on Admission    Weight Management Yes   Intervention Weight Management/Obesity: Establish reasonable short term and long term weight goals.   Admit Weight 240 lb (108.9 kg)   Goal Weight: Short Term 235 lb (106.6 kg)   Goal Weight: Long Term 230 lb (104.3 kg)   Expected Outcomes Short Term: Continue to assess and modify interventions until short term weight is achieved;Long Term: Adherence to nutrition and physical activity/exercise program aimed toward attainment of established weight goal   Tobacco Cessation Yes   Number of packs per day --  he has cut down to 1 cigar/day   Intervention Assist the participant in steps to quit. Provide individualized education and counseling about  committing to Tobacco Cessation, relapse prevention, and pharmacological support that can be provided by physician.   Expected Outcomes Short Term: Will demonstrate readiness to quit, by selecting a quit date.;Long Term: Complete abstinence from all tobacco products for at least 12 months from quit date.   Lipids --  They are now under control    Personal Goal Other Yes   Personal Goal Regain his funcational capacity. Lose 10 lbs.    Intervention Attend CR 3xweek and supplement exercise at home 2 x week.    Expected Outcomes Achieve personal goals.       Core Components/Risk Factors/Patient Goals Review:      Goals and Risk Factor Review    Row Name 07/21/16 1140 08/10/16 1518 09/08/16 0815 10/06/16 0819 10/20/16 1632     Core Components/Risk Factors/Patient Goals Review   Personal Goals Review Weight Management/Obesity Weight Management/Obesity Weight Management/Obesity  Lose 10 lbs.  Weight Management/Obesity  Lose 10 lbs.  Get functional again. Weight Management/Obesity  Lose 10 lbs; get functional again.   Review  - Patient has completed 9 sessions losing 4 lbs.  Patient has completed 20 sessions losing 2 lbs. He continues to do well in the program and says he feels better since he started.  Patient has completed 29 sessions losing 6 lbs. He continues to do well in the program meeting his weight loss goal.  Patient has completed 33 sessions losing 6 lbs. He continues to do well in the program saying he feels better and is getting back to doing his ADL's and other chores around the house without difficulty.  He says the program continue to be very helpful.    Expected Outcomes  - Patient will complete the program meeting his personal goals.  Patient will complete the program meeting his weight loss goal.  Patient will complete the program and meet his weight loss goal.  Patient will complete the program and continue to meet his personal goals.       Core Components/Risk Factors/Patient Goals at Discharge (Final Review):      Goals and Risk Factor Review - 10/20/16 1632      Core Components/Risk Factors/Patient Goals Review   Personal Goals Review Weight Management/Obesity  Lose 10 lbs; get functional again.   Review Patient has completed 33 sessions losing 6 lbs. He continues to do well in the program saying he feels better  and is getting back to doing his ADL's and other chores around the house without difficulty.  He says the program continue to be very helpful.    Expected Outcomes Patient will complete the program and continue to meet his personal goals.       ITP Comments:     ITP Comments    Row Name 07/21/16 1419           ITP Comments Patient new to program. Plans to start Monday 07/25/16.          Comments: ITP 30 Day REVIEW Patient is doing well in the program. Will continue to monitor for progress.

## 2016-10-21 ENCOUNTER — Encounter (HOSPITAL_COMMUNITY)
Admission: RE | Admit: 2016-10-21 | Discharge: 2016-10-21 | Disposition: A | Discharge status: Home / Self Care | Payer: Medicare Other | Source: Ambulatory Visit | Attending: Cardiology | Admitting: Cardiology

## 2016-10-21 DIAGNOSIS — I214 Non-ST elevation (NSTEMI) myocardial infarction: Secondary | ICD-10-CM

## 2016-10-21 DIAGNOSIS — Z955 Presence of coronary angioplasty implant and graft: Secondary | ICD-10-CM

## 2016-10-24 ENCOUNTER — Encounter (HOSPITAL_COMMUNITY)
Admission: RE | Admit: 2016-10-24 | Discharge: 2016-10-24 | Disposition: A | Discharge status: Home / Self Care | Payer: Medicare Other | Source: Ambulatory Visit | Attending: Cardiology | Admitting: Cardiology

## 2016-10-24 DIAGNOSIS — I214 Non-ST elevation (NSTEMI) myocardial infarction: Secondary | ICD-10-CM

## 2016-10-24 DIAGNOSIS — Z955 Presence of coronary angioplasty implant and graft: Secondary | ICD-10-CM

## 2016-10-24 NOTE — Progress Notes (Signed)
Daily Session Note  Patient Details  Name: Stephen Macdonald MRN: [id number] Date of Birth: May 15, 1945 Referring Provider:     CARDIAC REHAB PHASE II ORIENTATION from 07/21/2016 in [place]  Referring Provider  Dr. Janese Banks      Encounter Date: 10/24/2016  Check In:     Session Check In - 10/24/16 0930      Check-In   Location [facility]   Staff Present Aundra Dubin, RN, BSN;Gregory Luther Parody, BS, EP, Exercise Physiologist   Supervising physician immediately available to respond to emergencies See telemetry face sheet for immediately available MD   Medication changes reported     No   Fall or balance concerns reported    No   Tobacco Cessation No Change   Warm-up and Cool-down Performed as group-led instruction   Resistance Training Performed Yes   VAD Patient? No     Pain Assessment   Currently in Pain? No/denies   Pain Score 0-No pain   Multiple Pain Sites No      Capillary Blood Glucose: No results found for this or any previous visit (from the past 24 hour(s)).    History  Smoking Status  . Current Some Day Smoker  . Types: Cigars, Cigarettes  Smokeless Tobacco  . Not on file    Comment: Discussed cessation and shared we have classes that he could attend.     Goals Met:  Independence with exercise equipment Exercise tolerated well No report of cardiac concerns or symptoms Strength training completed today  Goals Unmet:  Not Applicable  Comments: Check out 1030.   Dr. Kate Sable is Medical Director for [facility] Cardiac and Pul[facility] Re[facility].

## 2016-10-26 ENCOUNTER — Encounter (HOSPITAL_COMMUNITY)
Admission: RE | Admit: 2016-10-26 | Discharge: 2016-10-26 | Disposition: A | Discharge status: Home / Self Care | Payer: Medicare Other | Source: Ambulatory Visit | Attending: Cardiology | Admitting: Cardiology

## 2016-10-26 DIAGNOSIS — I214 Non-ST elevation (NSTEMI) myocardial infarction: Secondary | ICD-10-CM

## 2016-10-26 DIAGNOSIS — Z955 Presence of coronary angioplasty implant and graft: Secondary | ICD-10-CM

## 2016-10-26 NOTE — Progress Notes (Signed)
Daily Session Note  Patient Details  Name: Stephen Macdonald MRN: [id number] Date of Birth: 1945-11-01 Referring Provider:     CARDIAC REHAB PHASE II ORIENTATION from 07/21/2016 in [place]  Referring Provider  Dr. Janese Banks      Encounter Date: 10/26/2016  Check In:     Session Check In - 10/26/16 0930      Check-In   Location [facility]   Staff Present Aundra Dubin, RN, BSN;Gregory Luther Parody, BS, EP, Exercise Physiologist   Supervising physician immediately available to respond to emergencies See telemetry face sheet for immediately available MD   Medication changes reported     No   Fall or balance concerns reported    No   Tobacco Cessation No Change   Warm-up and Cool-down Performed as group-led instruction   Resistance Training Performed Yes   VAD Patient? No     Pain Assessment   Currently in Pain? No/denies   Pain Score 0-No pain   Multiple Pain Sites No      Capillary Blood Glucose: No results found for this or any previous visit (from the past 24 hour(s)).    History  Smoking Status  . Current Some Day Smoker  . Types: Cigars, Cigarettes  Smokeless Tobacco  . Not on file    Comment: Discussed cessation and shared we have classes that he could attend.     Goals Met:  Independence with exercise equipment Exercise tolerated well No report of cardiac concerns or symptoms Strength training completed today  Goals Unmet:  Not Applicable  Comments: Check out 1030.   Dr. Kate Sable is Medical Director for [facility] Cardiac and Pulmonary Rehab.

## 2016-10-28 ENCOUNTER — Encounter (HOSPITAL_COMMUNITY)
Admission: RE | Admit: 2016-10-28 | Discharge: 2016-10-28 | Disposition: A | Discharge status: Home / Self Care | Payer: Medicare Other | Source: Ambulatory Visit | Attending: Cardiology | Admitting: Cardiology

## 2016-10-28 VITALS — Ht 73.0 in | Wt 237.5 lb

## 2016-10-28 DIAGNOSIS — Z955 Presence of coronary angioplasty implant and graft: Secondary | ICD-10-CM

## 2016-10-28 DIAGNOSIS — I214 Non-ST elevation (NSTEMI) myocardial infarction: Secondary | ICD-10-CM

## 2016-10-28 NOTE — Progress Notes (Signed)
Daily Session Note  Patient Details  Name: Stephen Macdonald MRN: [id number] Date of Birth: 05/17/1945 Referring Provider:     CARDIAC REHAB PHASE II ORIENTATION from 07/21/2016 in [place] [facility]  Referring Provider  Dr. Rao      Encounter Date: 10/28/2016  Check In:     Session Check In - 10/28/16 0930      Check-In   Location [facility]   Staff Present Diane Coad, MS, EP, CH[facility], Exercise Physiologist;Gregory Cowan, BS, EP, Exercise Physiologist   Supervising physician immediately available to respond to emergencies See telemetry face sheet for immediately available MD   Medication changes reported     No   Fall or balance concerns reported    No   Tobacco Cessation No Change   Warm-up and Cool-down Performed as group-led instruction   Resistance Training Performed Yes   VAD Patient? No     Pain Assessment   Currently in Pain? No/denies   Pain Score 0-No pain   Multiple Pain Sites No      Capillary Blood Glucose: No results found for this or any previous visit (from the past 24 hour(s)).    History  Smoking Status  . Current Some Day Smoker  . Types: Cigars, Cigarettes  Smokeless Tobacco  . Not on file    Comment: Discussed cessation and shared we have classes that he could attend.     Goals Met:  Independence with exercise equipment Exercise tolerated well No report of cardiac concerns or symptoms Strength training completed today  Goals Unmet:  Not Applicable  Comments: Check out: 10:30   Dr. Suresh Koneswaran is Medical Director for [facility] and [facility]. 

## 2016-10-31 ENCOUNTER — Encounter (HOSPITAL_COMMUNITY): Payer: Medicare Other

## 2016-11-02 NOTE — Progress Notes (Signed)
Cardiac Individual Treatment Plan  Patient Details  Name: Stephen Macdonald MRN: [id number] Date of Birth: May 24, 1945 Referring Provider:     CARDIAC REHAB PHASE II ORIENTATION from 07/21/2016 in [place]  Referring Provider  Dr. Janese Banks      Initial Encounter Date:    CARDIAC REHAB PHASE II ORIENTATION from 07/21/2016 in [place]  Date  07/21/16  Referring Provider  Dr. Janese Banks      Visit Diagnosis: NSTEMI (non-ST elevated myocardial infarction) [facility])  Status post coronary artery stent placement  Patient's Home Medications on Admission:  Current Outpatient Prescriptions:  .  aspirin EC 81 MG tablet, Take 81 mg by mouth every morning. , Disp: , Rfl:  .  atorvastatin (LIPITOR) 80 MG tablet, Take 1 tablet (80 mg total) by mouth daily. (Patient taking differently: Take 80 mg by mouth at bedtime. ), Disp: 30 tablet, Rfl: 11 .  cholecalciferol (VITAMIN D) 1000 UNITS tablet, Take 1,000 Units by mouth at bedtime. , Disp: , Rfl:  .  furosemide (LASIX) 40 MG tablet, Take 1 tablet (40 mg total) by mouth daily. (Patient taking differently: Take 40 mg by mouth daily as needed. ), Disp: 30 tablet, Rfl: 3 .  metoprolol succinate (TOPROL-XL) 25 MG 24 hr tablet, Take 25 mg by mouth every morning., Disp: , Rfl:  .  nitroGLYCERIN (NITROSTAT) 0.4 MG SL tablet, Place 1 tablet (0.4 mg total) under the tongue every 5 (five) minutes as needed for chest pain., Disp: 25 tablet, Rfl: 3 .  potassium chloride 20 MEQ TBCR, Take 20 mEq by mouth daily. (Patient taking differently: Take 20 mEq by mouth daily as needed. ), Disp: 30 tablet, Rfl: 3 .  tamsulosin (FLOMAX) 0.4 MG CAPS capsule, Take 0.4 mg by mouth every morning., Disp: , Rfl:  .  ticagrelor (BRILINTA) 90 MG TABS tablet, Take 1 tablet (90 mg total) by mouth 2 (two) times daily., Disp: 60 tablet, Rfl: 6 .  valsartan (DIOVAN) 40 MG tablet, Take 40 mg by mouth at bedtime. , Disp: , Rfl:   Past Medical History: Past  Medical History:  Diagnosis Date  . Chronic combined systolic and diastolic CHF, NYHA class 3 ([place])    a. 01/2015 Echo: EF 35-30%; b. 03/2016 Echo: Ef 20%, Gr1DD, mid-apical anterior, mid anteroseptal, apical inferior, basal inferolateral, mid anterolateral, and apical AK, basal anteroseptal and mid inferior severe HK, midlly dil LA, mild TR.  . CKD (chronic kidney disease), stage III 04/15/2016  . Coronary artery disease    a. 1997 s/p CABG;  b. 01/2015 MI/PCI: G->PDA;  c. 03/2016 NSTEMI/PCI: LM 20, LAD 148m D2 80, RI 60, LCX ok, OM1 90, RCA 60p, 1070mRPAV 80, G->RPDA 15p ISR, 90d (3.5x28 Promus Premier DES), LIMA->OM1->dLAD nl.  . High cholesterol   . Hypertension   . Ischemic cardiomyopathy    a. 01/2015 Echo: EF 25-30%; b. s/p AICD;  c. 03/2016 Echo: Ef 20%, Gr1DD.  . Marland Kitchenmoker     Tobacco Use: History  Smoking Status  . Current Some Day Smoker  . Types: Cigars, Cigarettes  Smokeless Tobacco  . Not on file    Comment: Discussed cessation and shared we have classes that he could attend.     Labs: Recent Review Flowsheet Data    Labs for ITP Cardiac and Pulmonary Rehab Latest Ref Rng & Units 02/12/2015 04/16/2016   Cholestrol 0 - 200 mg/dL - 157   LDLCALC 0 - 99 mg/dL - 95   HDL >40  mg/dL - 43   Trlycerides <150 mg/dL - 96   Hemoglobin A1c 4.8 - 5.6 % - 5.6   TCO2 0 - 100 mmol/L 18 -      Capillary Blood Glucose: No results found for: GLUCAP   Exercise Target Goals:    Exercise Program Goal: Individual exercise prescription set with THRR, safety & activity barriers. Participant demonstrates ability to understand and report RPE using BORG scale, to self-measure pulse accurately, and to acknowledge the importance of the exercise prescription.  Exercise Prescription Goal: Starting with aerobic activity 30 plus minutes a day, 3 days per week for initial exercise prescription. Provide home exercise prescription and guidelines that participant acknowledges understanding  prior to discharge.  Activity Barriers & Risk Stratification:   6 Minute Walk:     6 Minute Walk    Row Name 07/21/16 0959 10/31/16 1441       6 Minute Walk   Phase Initial Discharge    Distance 1700 feet 1850 feet    Distance % Change 0 % 8.82 %    Walk Time 6 minutes 6 minutes    # of Rest Breaks 0 0    MPH 3.21 3.5    METS 3.46 3.68    RPE 9 10    Perceived Dyspnea  9 9    VO2 Peak 11.54 12.54    Symptoms No No    Resting HR 76 bpm 84 bpm    Resting BP 112/70 120/62    Max Ex. HR 100 bpm 94 bpm    Max Ex. BP 124/70 132/66    2 Minute Post BP 110/68 122/64       Oxygen Initial Assessment:   Oxygen Re-Evaluation:   Oxygen Discharge (Final Oxygen Re-Evaluation):   Initial Exercise Prescription:     Initial Exercise Prescription - 07/21/16 1000      Date of Initial Exercise RX and Referring Provider   Date 07/21/16   Referring Provider Dr. Janese Banks     Treadmill   MPH 2.5   Grade 0   Minutes 20   METs 2.9     NuStep   Level 2   SPM 25   Minutes 15   METs 2     Prescription Details   Frequency (times per week) 3   Duration Progress to 30 minutes of continuous aerobic without signs/symptoms of physical distress     Intensity   THRR 40-80% of Max Heartrate 105-120-134   Ratings of Perceived Exertion 11-13   Perceived Dyspnea 0-4     Progression   Progression Continue progressive overload as per policy without signs/symptoms or physical distress.     Resistance Training   Training Prescription Yes   Weight 1   Reps 10-15      Perform Capillary Blood Glucose checks as needed.  Exercise Prescription Changes:      Exercise Prescription Changes    Row Name 08/08/16 1400 09/02/16 1400 09/19/16 1400 09/29/16 1400 10/17/16 1400     Response to Exercise   Blood Pressure (Admit) 110/70 112/60 110/60 116/66 102/60   Blood Pressure (Exercise) 100/60 130/68 120/60 118/62 166/66   Blood Pressure (Exit) 108/60 118/62 1'06/58 94/60 96/58 '   Heart  Rate (Admit) 57 bpm 75 bpm 76 bpm 69 bpm 66 bpm   Heart Rate (Exercise) 116 bpm 90 bpm 103 bpm 99 bpm 106 bpm   Heart Rate (Exit) 66 bpm 83 bpm 85 bpm 79 bpm 73 bpm   Rating of Perceived Exertion (  Exercise) '9 9 9 10 10   ' Duration Progress to 30 minutes of  aerobic without signs/symptoms of physical distress Progress to 30 minutes of  aerobic without signs/symptoms of physical distress Progress to 30 minutes of  aerobic without signs/symptoms of physical distress Progress to 30 minutes of  aerobic without signs/symptoms of physical distress Progress to 30 minutes of  aerobic without signs/symptoms of physical distress   Intensity THRR unchanged THRR unchanged THRR unchanged THRR unchanged THRR unchanged     Progression   Progression Continue to progress workloads to maintain intensity without signs/symptoms of physical distress. Continue to progress workloads to maintain intensity without signs/symptoms of physical distress. Continue to progress workloads to maintain intensity without signs/symptoms of physical distress. Continue to progress workloads to maintain intensity without signs/symptoms of physical distress. Continue to progress workloads to maintain intensity without signs/symptoms of physical distress.     Resistance Training   Training Prescription Yes Yes Yes Yes Yes   Weight '2 3 4 4 4   ' Reps 10-15 10-15 10-15 10-15 10-15     Treadmill   MPH 2.7 3 3.3 3.1 3.2   Grade 0 0.5 0.'5 1 1   ' Minutes '15 15 15 15 15   ' METs 3 3.5 3.4 3.7 3.8     NuStep   Level '3 3 4 4 4   ' SPM 84 118 140 84 54   Minutes '20 20 20 20 20   ' METs 3.7 3.7 4 3.7 2.3     Home Exercise Plan   Plans to continue exercise at Home (comment) Home (comment) Home (comment) Home (comment) Home (comment)   Frequency Add 2 additional days to program exercise sessions. Add 2 additional days to program exercise sessions. Add 2 additional days to program exercise sessions. Add 2 additional days to program exercise sessions.  Add 2 additional days to program exercise sessions.      Exercise Comments:      Exercise Comments    Row Name 08/08/16 1420 09/02/16 1444 09/19/16 1443 09/29/16 1458 10/17/16 1430   Exercise Comments Patient is doing very well in CR.  Patient is progressing very well in CR.  Patient is doing well in CR.  Patient is doing well in CR.  Patient is doingh very well in CR.       Exercise Goals and Review:      Exercise Goals    Row Name 07/21/16 1134             Exercise Goals   Increase Physical Activity Yes       Intervention Provide advice, education, support and counseling about physical activity/exercise needs.;Develop an individualized exercise prescription for aerobic and resistive training based on initial evaluation findings, risk stratification, comorbidities and participant's personal goals.       Expected Outcomes Achievement of increased cardiorespiratory fitness and enhanced flexibility, muscular endurance and strength shown through measurements of functional capacity and personal statement of participant.       Increase Strength and Stamina Yes       Intervention Provide advice, education, support and counseling about physical activity/exercise needs.;Develop an individualized exercise prescription for aerobic and resistive training based on initial evaluation findings, risk stratification, comorbidities and participant's personal goals.       Expected Outcomes Achievement of increased cardiorespiratory fitness and enhanced flexibility, muscular endurance and strength shown through measurements of functional capacity and personal statement of participant.          Exercise Goals Re-Evaluation :  Exercise Goals Re-Evaluation    Row Name 08/10/16 1519 09/08/16 0816 10/06/16 0821 10/20/16 1635 11/02/16 1247     Exercise Goal Re-Evaluation   Exercise Goals Review Increase Physical Activity;Increase Strenth and Stamina  Get functional again.   Increase Physical  Activity;Increase Strenth and Stamina  Get functional again.  Increase Physical Activity;Increase Strenth and Stamina Increase Physical Activity;Increase Strenth and Stamina Increase Physical Activity;Increase Strenth and Stamina   Comments Patient has completed 9 sessions. He has progress well and says he has more energy to do things around the house.  After completing 20 sessions, patient says he is getting stronger and is doing more around the house. He said he was able to use a sledge hammer for the first time in years. He continues to progress and do well in the program.  After completing 29 sessions, patient's strength and stamina have increased. He continues to do more around the house and is able to take care of his grand-daughter without difficulty. He continues to progress well in the program. Patient has completed 33 sessions with increased strength, stamina, and activity. He continues to progress and do well.  Patient graduated with 36 sessions with increased strength, stamina, and activity. He says he feels stronger and better overall and is able to do more around the house. His exit walk test improved by 8.82%. He progressed well in the program. He is excercising at home the 2 days he does not attend CR.    Expected Outcomes Patient will complete the program with increased strength, stamina, and activity. Patient will complete the program with continued increased strength, stamina, and activity.  Patient will complete the program with continued increased strength, stamina, and activity.  Patient will complete the program with continued increased strength, stamina, and activity. Patient will continue to exercise with continued increased strength, stamina, and activity.       Discharge Exercise Prescription (Final Exercise Prescription Changes):     Exercise Prescription Changes - 10/17/16 1400      Response to Exercise   Blood Pressure (Admit) 102/60   Blood Pressure (Exercise) 166/66    Blood Pressure (Exit) 96/58   Heart Rate (Admit) 66 bpm   Heart Rate (Exercise) 106 bpm   Heart Rate (Exit) 73 bpm   Rating of Perceived Exertion (Exercise) 10   Duration Progress to 30 minutes of  aerobic without signs/symptoms of physical distress   Intensity THRR unchanged     Progression   Progression Continue to progress workloads to maintain intensity without signs/symptoms of physical distress.     Resistance Training   Training Prescription Yes   Weight 4   Reps 10-15     Treadmill   MPH 3.2   Grade 1   Minutes 15   METs 3.8     NuStep   Level 4   SPM 54   Minutes 20   METs 2.3     Home Exercise Plan   Plans to continue exercise at Home (comment)   Frequency Add 2 additional days to program exercise sessions.      Nutrition:  Target Goals: Understanding of nutrition guidelines, daily intake of sodium <1547m, cholesterol <2064m calories 30% from fat and 7% or less from saturated fats, daily to have 5 or more servings of fruits and vegetables.  Biometrics:     Pre Biometrics - 07/21/16 1002      Pre Biometrics   Height '6\' 1"'  (1.854 m)   Weight 239 lb 13.8 oz (108.8 kg)  Waist Circumference 41 inches   Hip Circumference 40.5 inches   Waist to Hip Ratio 1.01 %   BMI (Calculated) 31.7   Triceps Skinfold 8 mm   % Body Fat 26.5 %   Grip Strength 92.67 kg   Flexibility 10 in   Single Leg Stand 14 seconds         Post Biometrics - 10/31/16 1442       Post  Biometrics   Height '6\' 1"'  (1.854 m)   Weight 237 lb 7.7 oz (107.7 kg)   Waist Circumference 40 inches   Hip Circumference 40 inches   Waist to Hip Ratio 1 %   BMI (Calculated) 31.4   Triceps Skinfold 9 mm   % Body Fat 26.3 %   Grip Strength 101.8 kg   Flexibility 10 in   Single Leg Stand 20 seconds      Nutrition Therapy Plan and Nutrition Goals:   Nutrition Discharge: Rate Your Plate Scores:     Nutrition Assessments - 11/02/16 1242      MEDFICTS Scores   Pre Score 41   Post  Score 47   Score Difference 6      Nutrition Goals Re-Evaluation:   Nutrition Goals Discharge (Final Nutrition Goals Re-Evaluation):   Psychosocial: Target Goals: Acknowledge presence or absence of significant depression and/or stress, maximize coping skills, provide positive support system. Participant is able to verbalize types and ability to use techniques and skills needed for reducing stress and depression.  Initial Review & Psychosocial Screening:     Initial Psych Review & Screening - 07/21/16 1141      Initial Review   Current issues with Current Depression     Family Dynamics   Good Support System? Yes     Barriers   Psychosocial barriers to participate in program Psychosocial barriers identified (see note)  He is a little down because of his recent heart problems. He says he is feeling better.      Screening Interventions   Interventions Encouraged to exercise      Quality of Life Scores:     Quality of Life - 10/31/16 1442      Quality of Life Scores   Health/Function Pre 16.23 %   Health/Function Post 18.73 %   Health/Function % Change 15.4 %   Socioeconomic Pre 24.75 %   Socioeconomic Post 22.67 %   Socioeconomic % Change  -8.4 %   Psych/Spiritual Pre 21.14 %   Psych/Spiritual Post 22.07 %   Psych/Spiritual % Change 4.4 %   Family Pre 28.8 %   Family Post 26.8 %   Family % Change -6.94 %   GLOBAL Pre 20.73 %   GLOBAL Post 21.38 %   GLOBAL % Change 3.14 %      PHQ-9: Recent Review Flowsheet Data    Depression screen [facility] 2/9 11/02/2016 07/21/2016 11/05/2015 06/16/2015   Decreased Interest '1 1 1 1   ' Down, Depressed, Hopeless 0 0 0 1   PHQ - 2 Score '1 1 1 2   ' Altered sleeping 1 0 0 0   Tired, decreased energy '2 2 1 ' 0   Change in appetite 0 0 0 0   Feeling bad or failure about yourself  0 0 0 0   Trouble concentrating 0 0 0 0   Moving slowly or fidgety/restless 0 0 0 0   Suicidal thoughts 0 0 0 0   PHQ-9 Score '4 3 2 2   ' Difficult doing  work/chores  Somewhat difficult Somewhat difficult Somewhat difficult Somewhat difficult     Interpretation of Total Score  Total Score Depression Severity:  1-4 = Minimal depression, 5-9 = Mild depression, 10-14 = Moderate depression, 15-19 = Moderately severe depression, 20-27 = Severe depression   Psychosocial Evaluation and Intervention:     Psychosocial Evaluation - 11/02/16 1250      Discharge Psychosocial Assessment & Intervention   Comments Patient has no psychosocial issues identified at discharge. His QOL score improved 3.14% but his PHQ-9 score increased 3 to 2.       Psychosocial Re-Evaluation:     Psychosocial Re-Evaluation    Row Name 07/21/16 1144 08/10/16 1521 09/08/16 0819 10/06/16 0824 10/20/16 1637     Psychosocial Re-Evaluation   Current issues with Current Depression None Identified Current Depression None Identified None Identified   Comments  -  - Patient continues to have a positive outlook and good family support and is managing his depression. When patient started the program, he was feeling down d/t his recent heart event. After completing 29 sessions, he feels better, not having down days because he is able to do more.  Patient continues to have no psychosocial issues identified.    Expected Outcomes  - Patient will have improved QOL and PHQ-9 scores at discharge.  Patient will have improved PHQ-9 and QOL scores at discharge.  Patient will have no psychosocial issues identified at discharge.  Patient will have no psychosocial issues identified at discharge.    Interventions Encouraged to attend Pulmonary Rehabilitation for the exercise Encouraged to attend Cardiac Rehabilitation for the exercise  . Encouraged to attend Cardiac Rehabilitation for the exercise;Relaxation education;Stress management education Encouraged to attend Cardiac Rehabilitation for the exercise Encouraged to attend Cardiac Rehabilitation for the exercise;Stress management  education;Relaxation education   Continue Psychosocial Services  No Follow up required No Follow up required No Follow up required No Follow up required Follow up required by staff      Psychosocial Discharge (Final Psychosocial Re-Evaluation):     Psychosocial Re-Evaluation - 10/20/16 1637      Psychosocial Re-Evaluation   Current issues with None Identified   Comments Patient continues to have no psychosocial issues identified.    Expected Outcomes Patient will have no psychosocial issues identified at discharge.    Interventions Encouraged to attend Cardiac Rehabilitation for the exercise;Stress management education;Relaxation education   Continue Psychosocial Services  Follow up required by staff      Vocational Rehabilitation: Provide vocational rehab assistance to qualifying candidates.   Vocational Rehab Evaluation & Intervention:     Vocational Rehab - 07/21/16 1130      Initial Vocational Rehab Evaluation & Intervention   Assessment shows need for Vocational Rehabilitation No      Education: Education Goals: Education classes will be provided on a weekly basis, covering required topics. Participant will state understanding/return demonstration of topics presented.  Learning Barriers/Preferences:     Learning Barriers/Preferences - 07/21/16 1129      Learning Barriers/Preferences   Learning Barriers None   Learning Preferences Skilled Demonstration;Individual Instruction;Group Instruction      Education Topics: Hypertension, Hypertension Reduction -Define heart disease and high blood pressure. Discus how high blood pressure affects the body and ways to reduce high blood pressure.   CARDIAC REHAB PHASE II EXERCISE from 08/17/2016 in [place]  Date  08/17/16  Educator  Russella Dar  Instruction Review Code  2- meets goals/outcomes      Exercise and Your Heart -  Discuss why it is important to exercise, the FITT principles of exercise,  normal and abnormal responses to exercise, and how to exercise safely.   CARDIAC REHAB PHASE II EXERCISE from 09/16/2015 in [place]  Date  08/26/15  Educator  Russella Dar  Instruction Review Code  2- meets goals/outcomes      Angina -Discuss definition of angina, causes of angina, treatment of angina, and how to decrease risk of having angina.   CARDIAC REHAB PHASE II EXERCISE from 09/16/2015 in [place]  Date  09/02/15  Educator  [place]  Instruction Review Code  2- meets goals/outcomes      Cardiac Medications -Review what the following cardiac medications are used for, how they affect the body, and side effects that may occur when taking the medications.  Medications include Aspirin, Beta blockers, calcium channel blockers, ACE Inhibitors, angiotensin receptor blockers, diuretics, digoxin, and antihyperlipidemics.   Congestive Heart Failure -Discuss the definition of CHF, how to live with CHF, the signs and symptoms of CHF, and how keep track of weight and sodium intake.   CARDIAC REHAB PHASE II EXERCISE from 09/16/2015 in [place]  Date  09/16/15  Educator  [place]  Instruction Review Code  2- meets goals/outcomes      Heart Disease and Intimacy -Discus the effect sexual activity has on the heart, how changes occur during intimacy as we age, and safety during sexual activity.   CARDIAC REHAB PHASE II EXERCISE from 09/16/2015 in [place]  Date  06/24/15  Educator  Russella Dar  Instruction Review Code  2- meets goals/outcomes      Smoking Cessation / COPD -Discuss different methods to quit smoking, the health benefits of quitting smoking, and the definition of COPD.   CARDIAC REHAB PHASE II EXERCISE from 09/16/2015 in [place]  Date  07/01/15  Educator  Russella Dar  Instruction Review Code  2- meets goals/outcomes      Nutrition I: Fats -Discuss the types  of cholesterol, what cholesterol does to the heart, and how cholesterol levels can be controlled.   CARDIAC REHAB PHASE II EXERCISE from 09/16/2015 in [place]  Date  07/08/15  Educator  Russella Dar  Instruction Review Code  2- meets goals/outcomes      Nutrition II: Labels -Discuss the different components of food labels and how to read food label   [place] from 09/16/2015 in [place]  Date  07/15/15  Educator  Russella Dar  Instruction Review Code  2- meets goals/outcomes      Heart Parts and Heart Disease -Discuss the anatomy of the heart, the pathway of blood circulation through the heart, and these are affected by heart disease.   CARDIAC REHAB PHASE II EXERCISE from 09/16/2015 in [place]  Date  07/22/15  Educator  Russella Dar  Instruction Review Code  2- meets goals/outcomes      Stress I: Signs and Symptoms -Discuss the causes of stress, how stress may lead to anxiety and depression, and ways to limit stress.   CARDIAC REHAB PHASE II EXERCISE from 09/16/2015 in [place]  Date  07/29/15  Educator  Russella Dar  Instruction Review Code  2- meets goals/outcomes      Stress II: Relaxation -Discuss different types of relaxation techniques to limit stress.   CARDIAC REHAB PHASE II EXERCISE from 08/17/2016 in [place]  Date  08/17/16      Warning Signs of Stroke / TIA -Discuss definition of a stroke, what the signs and symptoms are of a stroke, and how to identify when someone is having stroke.   CARDIAC REHAB PHASE II EXERCISE from 09/16/2015 in [place]  Date  08/12/15  Educator  Russella Dar  Instruction Review Code  2- meets goals/outcomes      Knowledge Questionnaire Score:     Knowledge Questionnaire Score - 11/02/16 1242      Knowledge Questionnaire Score   Pre Score 27/28   Post Score 26/28       Core Components/Risk Factors/Patient Goals at Admission:     Personal Goals and Risk Factors at Admission - 07/21/16 1135      Core Components/Risk Factors/Patient Goals on Admission    Weight Management Yes   Intervention Weight Management/Obesity: Establish reasonable short term and long term weight goals.   Admit Weight 240 lb (108.9 kg)   Goal Weight: Short Term 235 lb (106.6 kg)   Goal Weight: Long Term 230 lb (104.3 kg)   Expected Outcomes Short Term: Continue to assess and modify interventions until short term weight is achieved;Long Term: Adherence to nutrition and physical activity/exercise program aimed toward attainment of established weight goal   Tobacco Cessation Yes   Number of packs per day --  he has cut down to 1 cigar/day   Intervention Assist the participant in steps to quit. Provide individualized education and counseling about committing to Tobacco Cessation, relapse prevention, and pharmacological support that can be provided by physician.   Expected Outcomes Short Term: Will demonstrate readiness to quit, by selecting a quit date.;Long Term: Complete abstinence from all tobacco products for at least 12 months from quit date.   Lipids --  They are now under control   Personal Goal Other Yes   Personal Goal Regain his funcational capacity. Lose 10 lbs.    Intervention Attend CR 3xweek and supplement exercise at home 2 x week.    Expected Outcomes Achieve personal goals.       Core Components/Risk Factors/Patient Goals Review:      Goals and Risk Factor Review    Row Name 07/21/16 1140 08/10/16 1518 09/08/16 0815 10/06/16 0819 10/20/16 1632     Core Components/Risk Factors/Patient Goals Review   Personal Goals Review Weight Management/Obesity Weight Management/Obesity Weight Management/Obesity  Lose 10 lbs.  Weight Management/Obesity  Lose 10 lbs.  Get functional again. Weight Management/Obesity  Lose 10 lbs; get functional again.   Review  - Patient  has completed 9 sessions losing 4 lbs.  Patient has completed 20 sessions losing 2 lbs. He continues to do well in the program and says he feels better since he started.  Patient has completed 29 sessions losing 6 lbs. He continues to do well in the program meeting his weight loss goal.  Patient has completed 33 sessions losing 6 lbs. He continues to do well in the program saying he feels better and is getting back to doing his ADL's and other chores around the house without difficulty.  He says the program continue to be very helpful.    Expected Outcomes  - Patient will complete the program meeting his personal goals.  Patient will complete the program meeting his weight loss goal.  Patient will complete the program and meet his weight loss goal.  Patient will complete the program and continue to meet his personal goals.    [place] Name  11/02/16 1243             Core Components/Risk Factors/Patient Goals Review   Personal Goals Review Weight Management/Obesity  Lose 10 lbs; get functional again.       Review Patient graduated with 36 sessions gaining 7 lbs. His medficts score did not improve. He did well in the program stating it did help him to meet his goals. His exit grip strength imroved and his balance. He says he is doing things he has not felt like doing in a long time. He plans to continue exercising at home.        Expected Outcomes Patient will continue to exercise and continue to meet his personal goals.           Core Components/Risk Factors/Patient Goals at Discharge (Final Review):      Goals and Risk Factor Review - 11/02/16 1243      Core Components/Risk Factors/Patient Goals Review   Personal Goals Review Weight Management/Obesity  Lose 10 lbs; get functional again.   Review Patient graduated with 36 sessions gaining 7 lbs. His medficts score did not improve. He did well in the program stating it did help him to meet his goals. His exit grip strength imroved and his balance. He  says he is doing things he has not felt like doing in a long time. He plans to continue exercising at home.    Expected Outcomes Patient will continue to exercise and continue to meet his personal goals.       ITP Comments:     ITP Comments    Row Name 07/21/16 1419           ITP Comments Patient new to program. Plans to start Monday 07/25/16.          Comments: Patient graduated from [facility] today on 10/28/16 after completing 36 sessions. He achieved LTG of 30 minutes of aerobic exercise at Max Met level of 3.8. All patients vitals are WNL. Patient has met with dietician. Discharge instruction has been reviewed in detail and patient stated an understanding of material given. Patient plans to exercise at [facility]. Cardiac Rehab staff will make f/u calls at 1 month, 6 months, and 1 year. Patient had no complaints of any abnormal S/S or pain on their exit visit.

## 2016-11-02 NOTE — Progress Notes (Signed)
Discharge Summary  Patient Details  Name: Stephen Macdonald MRN: [id number] Date of Birth: September 19, 1945 Referring Provider:     CARDIAC REHAB PHASE II ORIENTATION from 07/21/2016 in [facility] CARDIAC REHABILITATION  Referring Provider  Dr. Smith Robert       Number of Visits: 36  Reason for Discharge:  Patient reached a stable level of exercise. Patient independent in their exercise.  Smoking History:  History  Smoking Status  . Current Some Day Smoker  . Types: Cigars, Cigarettes  Smokeless Tobacco  . Not on file    Comment: Discussed cessation and shared we have classes that he could attend.     Diagnosis:  NSTEMI (non-ST elevated myocardial infarction) (HCC)  Status post coronary artery stent placement  ADL UCSD:   Initial Exercise Prescription:     Initial Exercise Prescription - 07/21/16 1000      Date of Initial Exercise RX and Referring Provider   Date 07/21/16   Referring Provider Dr. Smith Robert     Treadmill   MPH 2.5   Grade 0   Minutes 20   METs 2.9     NuStep   Level 2   SPM 25   Minutes 15   METs 2     Prescription Details   Frequency (times per week) 3   Duration Progress to 30 minutes of continuous aerobic without signs/symptoms of physical distress     Intensity   THRR 40-80% of Max Heartrate 105-120-134   Ratings of Perceived Exertion 11-13   Perceived Dyspnea 0-4     Progression   Progression Continue progressive overload as per policy without signs/symptoms or physical distress.     Resistance Training   Training Prescription Yes   Weight 1   Reps 10-15      Discharge Exercise Prescription (Final Exercise Prescription Changes):     Exercise Prescription Changes - 10/17/16 1400      Response to Exercise   Blood Pressure (Admit) 102/60   Blood Pressure (Exercise) 166/66   Blood Pressure (Exit) 96/58   Heart Rate (Admit) 66 bpm   Heart Rate (Exercise) 106 bpm   Heart Rate (Exit) 73 bpm   Rating of Perceived Exertion (Exercise) 10   Duration Progress to 30 minutes of  aerobic without signs/symptoms of physical distress   Intensity THRR unchanged     Progression   Progression Continue to progress workloads to maintain intensity without signs/symptoms of physical distress.     Resistance Training   Training Prescription Yes   Weight 4   Reps 10-15     Treadmill   MPH 3.2   Grade 1   Minutes 15   METs 3.8     NuStep   Level 4   SPM 54   Minutes 20   METs 2.3     Home Exercise Plan   Plans to continue exercise at Home (comment)   Frequency Add 2 additional days to program exercise sessions.      Functional Capacity:     6 Minute Walk    Row Name 07/21/16 0959 10/31/16 1441       6 Minute Walk   Phase Initial Discharge    Distance 1700 feet 1850 feet    Distance % Change 0 % 8.82 %    Walk Time 6 minutes 6 minutes    # of Rest Breaks 0 0    MPH 3.21 3.5    METS 3.46 3.68    RPE 9 10  Perceived Dyspnea  9 9    VO2 Peak 11.54 12.54    Symptoms No No    Resting HR 76 bpm 84 bpm    Resting BP 112/70 120/62    Max Ex. HR 100 bpm 94 bpm    Max Ex. BP 124/70 132/66    2 Minute Post BP 110/68 122/64       Psychological, QOL, Others - Outcomes: PHQ 2/9: Depression screen [facility] 2/9 11/02/2016 07/21/2016 11/05/2015 06/16/2015  Decreased Interest 1 1 1 1   Down, Depressed, Hopeless 0 0 0 1  PHQ - 2 Score 1 1 1 2   Altered sleeping 1 0 0 0  Tired, decreased energy 2 2 1  0  Change in appetite 0 0 0 0  Feeling bad or failure about yourself  0 0 0 0  Trouble concentrating 0 0 0 0  Moving slowly or fidgety/restless 0 0 0 0  Suicidal thoughts 0 0 0 0  PHQ-9 Score 4 3 2 2   Difficult doing work/chores Somewhat difficult Somewhat difficult Somewhat difficult Somewhat difficult    Quality of Life:     Quality of Life - 10/31/16 1442      Quality of Life Scores   Health/Function Pre 16.23 %   Health/Function Post 18.73 %   Health/Function % Change 15.4 %   Socioeconomic Pre 24.75 %   Socioeconomic  Post 22.67 %   Socioeconomic % Change  -8.4 %   Psych/Spiritual Pre 21.14 %   Psych/Spiritual Post 22.07 %   Psych/Spiritual % Change 4.4 %   Family Pre 28.8 %   Family Post 26.8 %   Family % Change -6.94 %   GLOBAL Pre 20.73 %   GLOBAL Post 21.38 %   GLOBAL % Change 3.14 %      Personal Goals: Goals established at orientation with interventions provided to work toward goal.     Personal Goals and Risk Factors at Admission - 07/21/16 1135      Core Components/Risk Factors/Patient Goals on Admission    Weight Management Yes   Intervention Weight Management/Obesity: Establish reasonable short term and long term weight goals.   Admit Weight 240 lb (108.9 kg)   Goal Weight: Short Term 235 lb (106.6 kg)   Goal Weight: Long Term 230 lb (104.3 kg)   Expected Outcomes Short Term: Continue to assess and modify interventions until short term weight is achieved;Long Term: Adherence to nutrition and physical activity/exercise program aimed toward attainment of established weight goal   Tobacco Cessation Yes   Number of packs per day --  he has cut down to 1 cigar/day   Intervention Assist the participant in steps to quit. Provide individualized education and counseling about committing to Tobacco Cessation, relapse prevention, and pharmacological support that can be provided by physician.   Expected Outcomes Short Term: Will demonstrate readiness to quit, by selecting a quit date.;Long Term: Complete abstinence from all tobacco products for at least 12 months from quit date.   Lipids --  They are now under control   Personal Goal Other Yes   Personal Goal Regain his funcational capacity. Lose 10 lbs.    Intervention Attend CR 3xweek and supplement exercise at home 2 x week.    Expected Outcomes Achieve personal goals.        Personal Goals Discharge:     Goals and Risk Factor Review    Row Name 07/21/16 1140 08/10/16 1518 09/08/16 0815 10/06/16 1610 10/20/16 1632  Core  Components/Risk Factors/Patient Goals Review   Personal Goals Review Weight Management/Obesity Weight Management/Obesity Weight Management/Obesity  Lose 10 lbs.  Weight Management/Obesity  Lose 10 lbs.  Get functional again. Weight Management/Obesity  Lose 10 lbs; get functional again.   Review  - Patient has completed 9 sessions losing 4 lbs.  Patient has completed 20 sessions losing 2 lbs. He continues to do well in the program and says he feels better since he started.  Patient has completed 29 sessions losing 6 lbs. He continues to do well in the program meeting his weight loss goal.  Patient has completed 33 sessions losing 6 lbs. He continues to do well in the program saying he feels better and is getting back to doing his ADL's and other chores around the house without difficulty.  He says the program continue to be very helpful.    Expected Outcomes  - Patient will complete the program meeting his personal goals.  Patient will complete the program meeting his weight loss goal.  Patient will complete the program and meet his weight loss goal.  Patient will complete the program and continue to meet his personal goals.    Row Name 11/02/16 1243             Core Components/Risk Factors/Patient Goals Review   Personal Goals Review Weight Management/Obesity  Lose 10 lbs; get functional again.       Review Patient graduated with 36 sessions gaining 7 lbs. His medficts score did not improve. He did well in the program stating it did help him to meet his goals. His exit grip strength imroved and his balance. He says he is doing things he has not felt like doing in a long time. He plans to continue exercising at home.        Expected Outcomes Patient will continue to exercise and continue to meet his personal goals.           Nutrition & Weight - Outcomes:     Pre Biometrics - 07/21/16 1002      Pre Biometrics   Height 6\' 1"  (1.854 m)   Weight 239 lb 13.8 oz (108.8 kg)   Waist  Circumference 41 inches   Hip Circumference 40.5 inches   Waist to Hip Ratio 1.01 %   BMI (Calculated) 31.7   Triceps Skinfold 8 mm   % Body Fat 26.5 %   Grip Strength 92.67 kg   Flexibility 10 in   Single Leg Stand 14 seconds         Post Biometrics - 10/31/16 1442       Post  Biometrics   Height 6\' 1"  (1.854 m)   Weight 237 lb 7.7 oz (107.7 kg)   Waist Circumference 40 inches   Hip Circumference 40 inches   Waist to Hip Ratio 1 %   BMI (Calculated) 31.4   Triceps Skinfold 9 mm   % Body Fat 26.3 %   Grip Strength 101.8 kg   Flexibility 10 in   Single Leg Stand 20 seconds      Nutrition:   Nutrition Discharge:     Nutrition Assessments - 11/02/16 1242      MEDFICTS Scores   Pre Score 41   Post Score 47   Score Difference 6      Education Questionnaire Score:     Knowledge Questionnaire Score - 11/02/16 1242      Knowledge Questionnaire Score   Pre Score 27/28   Post Score  26/28      Goals reviewed with patient; copy given to patient.

## 2018-11-03 ENCOUNTER — Observation Stay (HOSPITAL_BASED_OUTPATIENT_CLINIC_OR_DEPARTMENT_OTHER): Payer: Medicare Other

## 2018-11-03 ENCOUNTER — Inpatient Hospital Stay (HOSPITAL_COMMUNITY)
Admission: EM | Admit: 2018-11-03 | Discharge: 2018-11-10 | DRG: 418 | Disposition: A | Discharge status: Home / Self Care | Payer: Medicare Other | Attending: Family Medicine | Admitting: Family Medicine

## 2018-11-03 ENCOUNTER — Encounter (HOSPITAL_COMMUNITY): Payer: Self-pay | Admitting: Emergency Medicine

## 2018-11-03 ENCOUNTER — Emergency Department (HOSPITAL_COMMUNITY): Payer: Medicare Other

## 2018-11-03 ENCOUNTER — Other Ambulatory Visit: Payer: Self-pay

## 2018-11-03 DIAGNOSIS — F17290 Nicotine dependence, other tobacco product, uncomplicated: Secondary | ICD-10-CM | POA: Diagnosis present

## 2018-11-03 DIAGNOSIS — I252 Old myocardial infarction: Secondary | ICD-10-CM

## 2018-11-03 DIAGNOSIS — K8000 Calculus of gallbladder with acute cholecystitis without obstruction: Secondary | ICD-10-CM | POA: Diagnosis present

## 2018-11-03 DIAGNOSIS — E785 Hyperlipidemia, unspecified: Secondary | ICD-10-CM | POA: Diagnosis present

## 2018-11-03 DIAGNOSIS — I361 Nonrheumatic tricuspid (valve) insufficiency: Secondary | ICD-10-CM | POA: Diagnosis not present

## 2018-11-03 DIAGNOSIS — R1013 Epigastric pain: Secondary | ICD-10-CM | POA: Diagnosis not present

## 2018-11-03 DIAGNOSIS — I10 Essential (primary) hypertension: Secondary | ICD-10-CM | POA: Diagnosis present

## 2018-11-03 DIAGNOSIS — R7989 Other specified abnormal findings of blood chemistry: Secondary | ICD-10-CM

## 2018-11-03 DIAGNOSIS — Z7902 Long term (current) use of antithrombotics/antiplatelets: Secondary | ICD-10-CM

## 2018-11-03 DIAGNOSIS — Z95810 Presence of automatic (implantable) cardiac defibrillator: Secondary | ICD-10-CM

## 2018-11-03 DIAGNOSIS — Z951 Presence of aortocoronary bypass graft: Secondary | ICD-10-CM

## 2018-11-03 DIAGNOSIS — Z79899 Other long term (current) drug therapy: Secondary | ICD-10-CM

## 2018-11-03 DIAGNOSIS — N183 Chronic kidney disease, stage 3 (moderate): Secondary | ICD-10-CM | POA: Diagnosis present

## 2018-11-03 DIAGNOSIS — R109 Unspecified abdominal pain: Secondary | ICD-10-CM

## 2018-11-03 DIAGNOSIS — Z888 Allergy status to other drugs, medicaments and biological substances status: Secondary | ICD-10-CM

## 2018-11-03 DIAGNOSIS — Z419 Encounter for procedure for purposes other than remedying health state, unspecified: Secondary | ICD-10-CM

## 2018-11-03 DIAGNOSIS — Z7982 Long term (current) use of aspirin: Secondary | ICD-10-CM

## 2018-11-03 DIAGNOSIS — E876 Hypokalemia: Secondary | ICD-10-CM | POA: Diagnosis not present

## 2018-11-03 DIAGNOSIS — I493 Ventricular premature depolarization: Secondary | ICD-10-CM | POA: Diagnosis present

## 2018-11-03 DIAGNOSIS — R945 Abnormal results of liver function studies: Secondary | ICD-10-CM | POA: Diagnosis present

## 2018-11-03 DIAGNOSIS — N400 Enlarged prostate without lower urinary tract symptoms: Secondary | ICD-10-CM | POA: Diagnosis present

## 2018-11-03 DIAGNOSIS — K8062 Calculus of gallbladder and bile duct with acute cholecystitis without obstruction: Secondary | ICD-10-CM | POA: Diagnosis not present

## 2018-11-03 DIAGNOSIS — I5042 Chronic combined systolic (congestive) and diastolic (congestive) heart failure: Secondary | ICD-10-CM | POA: Diagnosis present

## 2018-11-03 DIAGNOSIS — N179 Acute kidney failure, unspecified: Secondary | ICD-10-CM | POA: Diagnosis present

## 2018-11-03 DIAGNOSIS — E860 Dehydration: Secondary | ICD-10-CM | POA: Diagnosis present

## 2018-11-03 DIAGNOSIS — R079 Chest pain, unspecified: Secondary | ICD-10-CM | POA: Diagnosis present

## 2018-11-03 DIAGNOSIS — I5020 Unspecified systolic (congestive) heart failure: Secondary | ICD-10-CM | POA: Diagnosis present

## 2018-11-03 DIAGNOSIS — Z86010 Personal history of colonic polyps: Secondary | ICD-10-CM

## 2018-11-03 DIAGNOSIS — Z885 Allergy status to narcotic agent status: Secondary | ICD-10-CM

## 2018-11-03 DIAGNOSIS — Z1159 Encounter for screening for other viral diseases: Secondary | ICD-10-CM

## 2018-11-03 DIAGNOSIS — I5022 Chronic systolic (congestive) heart failure: Secondary | ICD-10-CM | POA: Diagnosis present

## 2018-11-03 DIAGNOSIS — I2510 Atherosclerotic heart disease of native coronary artery without angina pectoris: Secondary | ICD-10-CM | POA: Diagnosis present

## 2018-11-03 DIAGNOSIS — Z955 Presence of coronary angioplasty implant and graft: Secondary | ICD-10-CM

## 2018-11-03 DIAGNOSIS — I255 Ischemic cardiomyopathy: Secondary | ICD-10-CM | POA: Diagnosis present

## 2018-11-03 DIAGNOSIS — I130 Hypertensive heart and chronic kidney disease with heart failure and stage 1 through stage 4 chronic kidney disease, or unspecified chronic kidney disease: Secondary | ICD-10-CM | POA: Diagnosis present

## 2018-11-03 LAB — HEPATIC FUNCTION PANEL
ALT: 28 U/L (ref 0–44)
AST: 19 U/L (ref 15–41)
Albumin: 3.4 g/dL — ABNORMAL LOW (ref 3.5–5.0)
Alkaline Phosphatase: 57 U/L (ref 38–126)
Bilirubin, Direct: 0.2 mg/dL (ref 0.0–0.2)
Indirect Bilirubin: 0.8 mg/dL (ref 0.3–0.9)
Total Bilirubin: 1 mg/dL (ref 0.3–1.2)
Total Protein: 5.7 g/dL — ABNORMAL LOW (ref 6.5–8.1)

## 2018-11-03 LAB — BASIC METABOLIC PANEL
Anion gap: 10 (ref 5–15)
BUN: 13 mg/dL (ref 8–23)
CO2: 20 mmol/L — ABNORMAL LOW (ref 22–32)
Calcium: 8.8 mg/dL — ABNORMAL LOW (ref 8.9–10.3)
Chloride: 108 mmol/L (ref 98–111)
Creatinine, Ser: 1.3 mg/dL — ABNORMAL HIGH (ref 0.61–1.24)
GFR calc Af Amer: >60 mL/min (ref >60)
GFR calc non Af Amer: 54 mL/min — ABNORMAL LOW (ref >60)
Glucose, Bld: 151 mg/dL — ABNORMAL HIGH (ref 70–99)
Potassium: 4.3 mmol/L (ref 3.5–5.1)
Sodium: 138 mmol/L (ref 135–145)

## 2018-11-03 LAB — LIPASE, BLOOD: Lipase: 24 U/L (ref 11–51)

## 2018-11-03 LAB — CBC
HCT: 41.9 % (ref 39.0–52.0)
Hemoglobin: 14.7 g/dL (ref 13.0–17.0)
MCH: 32.9 pg (ref 26.0–34.0)
MCHC: 35.1 g/dL (ref 30.0–36.0)
MCV: 93.7 fL (ref 80.0–100.0)
Platelets: 194 10*3/uL (ref 150–400)
RBC: 4.47 MIL/uL (ref 4.22–5.81)
RDW: 11.9 % (ref 11.5–15.5)
WBC: 8.9 10*3/uL (ref 4.0–10.5)
nRBC: 0 % (ref 0.0–0.2)

## 2018-11-03 LAB — TROPONIN I (HIGH SENSITIVITY)
Troponin I (High Sensitivity): 9 ng/L (ref <18)
Troponin I (High Sensitivity): 10 ng/L (ref <18)

## 2018-11-03 LAB — SARS CORONAVIRUS 2 BY RT PCR (HOSPITAL ORDER, PERFORMED IN ~~LOC~~ HOSPITAL LAB): SARS Coronavirus 2: NEGATIVE

## 2018-11-03 MED ORDER — TAMSULOSIN HCL 0.4 MG PO CAPS
0.4000 mg | ORAL_CAPSULE | Freq: Every morning | ORAL | Status: DC
  Administered 2018-11-04 – 2018-11-10 (×7): 0.4 mg via ORAL
  Filled 2018-11-03 (×7): qty 1

## 2018-11-03 MED ORDER — ACETAMINOPHEN 325 MG PO TABS
650.0000 mg | ORAL_TABLET | Freq: Four times a day (QID) | ORAL | Status: DC | PRN
  Administered 2018-11-05 (×2): 650 mg via ORAL
  Filled 2018-11-03 (×4): qty 2

## 2018-11-03 MED ORDER — MORPHINE SULFATE (PF) 4 MG/ML IV SOLN
4.0000 mg | Freq: Once | INTRAVENOUS | Status: AC
  Administered 2018-11-03: 13:00:00 4 mg via INTRAVENOUS
  Filled 2018-11-03: qty 1

## 2018-11-03 MED ORDER — ATORVASTATIN CALCIUM 80 MG PO TABS
80.0000 mg | ORAL_TABLET | Freq: Every day | ORAL | Status: DC
  Administered 2018-11-03 – 2018-11-08 (×6): 80 mg via ORAL
  Filled 2018-11-03 (×6): qty 1

## 2018-11-03 MED ORDER — FUROSEMIDE 40 MG PO TABS
40.0000 mg | ORAL_TABLET | Freq: Every day | ORAL | Status: DC
  Administered 2018-11-04 – 2018-11-07 (×4): 40 mg via ORAL
  Filled 2018-11-03 (×4): qty 1

## 2018-11-03 MED ORDER — VITAMIN D 25 MCG (1000 UNIT) PO TABS
1000.0000 [IU] | ORAL_TABLET | Freq: Every day | ORAL | Status: DC
  Administered 2018-11-03 – 2018-11-09 (×7): 1000 [IU] via ORAL
  Filled 2018-11-03 (×8): qty 1

## 2018-11-03 MED ORDER — TICAGRELOR 90 MG PO TABS: 90.0000 mg | ORAL_TABLET | Freq: Two times a day (BID) | ORAL | Status: DC

## 2018-11-03 MED ORDER — IRBESARTAN 75 MG PO TABS
37.5000 mg | ORAL_TABLET | Freq: Every day | ORAL | Status: DC
  Administered 2018-11-03 – 2018-11-07 (×4): 37.5 mg via ORAL
  Filled 2018-11-03 (×5): qty 1

## 2018-11-03 MED ORDER — CLOPIDOGREL BISULFATE 75 MG PO TABS
75.0000 mg | ORAL_TABLET | Freq: Every day | ORAL | Status: DC
  Administered 2018-11-04 – 2018-11-05 (×2): 75 mg via ORAL
  Filled 2018-11-03 (×2): qty 1

## 2018-11-03 MED ORDER — ASPIRIN EC 81 MG PO TBEC
81.0000 mg | DELAYED_RELEASE_TABLET | Freq: Every morning | ORAL | Status: DC
  Administered 2018-11-04 – 2018-11-07 (×4): 81 mg via ORAL
  Filled 2018-11-03 (×4): qty 1

## 2018-11-03 MED ORDER — METOPROLOL SUCCINATE ER 25 MG PO TB24
25.0000 mg | ORAL_TABLET | Freq: Every evening | ORAL | Status: DC
  Administered 2018-11-03 – 2018-11-09 (×5): 25 mg via ORAL
  Filled 2018-11-03 (×6): qty 1

## 2018-11-03 MED ORDER — ONDANSETRON HCL 4 MG/2ML IJ SOLN
4.0000 mg | Freq: Four times a day (QID) | INTRAMUSCULAR | Status: DC | PRN
  Administered 2018-11-03: 15:00:00 4 mg via INTRAVENOUS
  Filled 2018-11-03: qty 2

## 2018-11-03 MED ORDER — EZETIMIBE 10 MG PO TABS
10.0000 mg | ORAL_TABLET | Freq: Every day | ORAL | Status: DC
  Administered 2018-11-03 – 2018-11-10 (×7): 10 mg via ORAL
  Filled 2018-11-03 (×7): qty 1

## 2018-11-03 MED ORDER — HYDROCODONE-ACETAMINOPHEN 5-325 MG PO TABS
1.0000 | ORAL_TABLET | ORAL | Status: DC | PRN
  Administered 2018-11-03 – 2018-11-06 (×2): 2 via ORAL
  Filled 2018-11-03 (×2): qty 2

## 2018-11-03 MED ORDER — ALUM & MAG HYDROXIDE-SIMETH 200-200-20 MG/5ML PO SUSP
30.0000 mL | Freq: Four times a day (QID) | ORAL | Status: DC | PRN
  Administered 2018-11-03 – 2018-11-06 (×4): 30 mL via ORAL
  Filled 2018-11-03 (×5): qty 30

## 2018-11-03 MED ORDER — ACETAMINOPHEN 650 MG RE SUPP: 650.0000 mg | Freq: Four times a day (QID) | RECTAL | Status: DC | PRN

## 2018-11-03 MED ORDER — SODIUM CHLORIDE 0.9 % IV BOLUS
500.0000 mL | Freq: Once | INTRAVENOUS | Status: AC
  Administered 2018-11-03: 11:00:00 500 mL via INTRAVENOUS

## 2018-11-03 MED ORDER — NITROGLYCERIN 0.4 MG SL SUBL: 0.4000 mg | SUBLINGUAL_TABLET | SUBLINGUAL | Status: DC | PRN

## 2018-11-03 MED ORDER — ONDANSETRON HCL 4 MG PO TABS: 4.0000 mg | ORAL_TABLET | Freq: Four times a day (QID) | ORAL | Status: DC | PRN

## 2018-11-03 MED ORDER — POTASSIUM CHLORIDE ER 20 MEQ PO TBCR: 20.0000 meq | EXTENDED_RELEASE_TABLET | Freq: Every day | ORAL | Status: DC

## 2018-11-03 MED ORDER — ENOXAPARIN SODIUM 40 MG/0.4ML ~~LOC~~ SOLN
40.0000 mg | SUBCUTANEOUS | Status: DC
  Administered 2018-11-03 – 2018-11-05 (×2): 40 mg via SUBCUTANEOUS
  Filled 2018-11-03 (×2): qty 0.4

## 2018-11-03 NOTE — ED Provider Notes (Signed)
[facility] Provider Note   CSN: [id number] Arrival date & time: 11/03/18  1015    History   Chief Complaint Chief Complaint  Patient presents with  . Chest Pain    HPI Dell PontoJerry P Pesta is a 73 y.o. male.  He has a prior history of CABG and has had 2 heart attacks last of which was 3 years ago.  He usually follows with the [facility].  He said his last episode of chest pain was when he had his heart attack.  He is complaining of 9 out of 10 substernal chest pain associated with nausea and vomiting.  He tried a nitro and on EMS arrival they gave him aspirin and another nitro with improvement of his pain to 6 out of 10.  Its not associate with any shortness of breath and it does not radiate anywhere.  Not associated with any diaphoresis.  EMS stated that his blood pressure was low after the nitro and so they did not continue with that.  HPI: A 73 year old patient with a history of hypertension presents for evaluation of chest pain. Initial onset of pain was approximately 3-6 hours ago. The patient's chest pain is described as heaviness/pressure/tightness, is not worse with exertion and is relieved by nitroglycerin. The patient complains of nausea. The patient's chest pain is middle- or left-sided, is not well-localized, is not sharp and does not radiate to the arms/jaw/neck. The patient denies diaphoresis. The patient has smoked in the past 90 days. The patient has no history of stroke, has no history of peripheral artery disease, denies any history of treated diabetes, has no relevant family history of coronary artery disease (first degree relative at less than age 74), has no history of hypercholesterolemia and does not have an elevated BMI (>=30).   The history is provided by the patient.  Chest Pain Pain location:  Substernal area Pain quality: pressure   Pain radiates to:  Does not radiate Pain severity:  Severe Onset quality:  Sudden Timing:  Constant  Progression:  Improving Chronicity:  Recurrent Worsened by:  Nothing Ineffective treatments:  Nitroglycerin Associated symptoms: nausea and vomiting   Associated symptoms: no abdominal pain, no back pain, no cough, no diaphoresis, no dizziness, no fever, no headache, no lower extremity edema, no palpitations and no shortness of breath   Risk factors: coronary artery disease, hypertension, male sex and smoking     Past Medical History:  Diagnosis Date  . Chronic combined systolic and diastolic CHF, NYHA class 3 (HCC)    a. 01/2015 Echo: EF 35-30%; b. 03/2016 Echo: Ef 20%, Gr1DD, mid-apical anterior, mid anteroseptal, apical inferior, basal inferolateral, mid anterolateral, and apical AK, basal anteroseptal and mid inferior severe HK, midlly dil LA, mild TR.  . CKD (chronic kidney disease), stage III (HCC) 04/15/2016  . Coronary artery disease    a. 1997 s/p CABG;  b. 01/2015 MI/PCI: G->PDA;  c. 03/2016 NSTEMI/PCI: LM 20, LAD 16321m, D2 80, RI 60, LCX ok, OM1 90, RCA 60p, 17821m, RPAV 80, G->RPDA 15p ISR, 90d (3.5x28 Promus Premier DES), LIMA->OM1->dLAD nl.  . High cholesterol   . Hypertension   . Ischemic cardiomyopathy    a. 01/2015 Echo: EF 25-30%; b. s/p AICD;  c. 03/2016 Echo: Ef 20%, Gr1DD.  Marland Kitchen. Smoker     Patient Active Problem List   Diagnosis Date Noted  . CAD (coronary artery disease) 04/18/2016  . Cardiomyopathy, ischemic 04/18/2016  . NSTEMI (non-ST elevated myocardial infarction) (HCC) 04/15/2016  .  HTN (hypertension) 04/15/2016  . Chronic systolic heart failure (HCC) 04/15/2016  . CKD (chronic kidney disease), stage III (HCC) 04/15/2016  . BPH (benign prostatic hyperplasia) 04/15/2016  . ST elevation myocardial infarction involving right coronary artery (HCC)   . S/P CABG x 2   . Hyperlipidemia   . NSVT (nonsustained ventricular tachycardia) (HCC)   . Acute inferior myocardial infarction [facility]) 02/12/2015    Past Surgical History:  Procedure Laterality Date  . CARDIAC  CATHETERIZATION N/A 02/12/2015   Procedure: Left Heart Cath and Coronary Angiography;  Surgeon: Corky Crafts, MD;  Location[facility] INVASIVE CV LAB;  Service: Cardiovascular;  Laterality: N/A;  . CARDIAC CATHETERIZATION N/A 02/12/2015   Procedure: Coronary Stent Intervention;  Surgeon: Corky Crafts, MD;  Location: [facility] INVASIVE CV LAB;  Service: Cardiovascular;  Laterality: N/A;  . CARDIAC CATHETERIZATION  02/12/2015   Procedure: Left Heart Cath and Cors/Grafts Angiography;  Surgeon: Corky Crafts, MD;  Location: [facility] INVASIVE CV LAB;  Service: Cardiovascular;;  . CARDIAC CATHETERIZATION N/A 04/16/2016   Procedure: LEFT HEART CATH AND CORS/GRAFTS ANGIOGRAPHY;  Surgeon: Yvonne Kendall, MD;  Location: [facility] INVASIVE CV LAB;  Service: Cardiovascular;  Laterality: N/A;  . CARDIAC CATHETERIZATION N/A 04/16/2016   Procedure: Coronary Stent Intervention;  Surgeon: Yvonne Kendall, MD;  Location: [facility] INVASIVE CV LAB;  Service: Cardiovascular;  Laterality: N/A;  . CARDIAC DEFIBRILLATOR PLACEMENT    . CORONARY ARTERY BYPASS GRAFT     x 3  . CORONARY STENT PLACEMENT    . PACEMAKER INSERTION          Home Medications    Prior to Admission medications   Medication Sig Start Date End Date Taking? Authorizing Provider  aspirin EC 81 MG tablet Take 81 mg by mouth every morning.     [provider]  atorvastatin (LIPITOR) 80 MG tablet Take 1 tablet (80 mg total) by mouth daily. Patient taking differently: Take 80 mg by mouth at bedtime.  02/16/15   Azalee Course, PA  cholecalciferol (VITAMIN D) 1000 UNITS tablet Take 1,000 Units by mouth at bedtime.     [provider]  furosemide (LASIX) 40 MG tablet Take 1 tablet (40 mg total) by mouth daily. Patient taking differently: Take 40 mg by mouth daily as needed.  04/18/16   Creig Hines, NP  metoprolol succinate (TOPROL-XL) 25 MG 24 hr tablet Take 25 mg by mouth every morning.    [provider]  nitroGLYCERIN  (NITROSTAT) 0.4 MG SL tablet Place 1 tablet (0.4 mg total) under the tongue every 5 (five) minutes as needed for chest pain. 02/16/15   Azalee Course, PA  potassium chloride 20 MEQ TBCR Take 20 mEq by mouth daily. Patient taking differently: Take 20 mEq by mouth daily as needed.  04/18/16   Creig Hines, NP  tamsulosin (FLOMAX) 0.4 MG CAPS capsule Take 0.4 mg by mouth every morning.    [provider]  ticagrelor (BRILINTA) 90 MG TABS tablet Take 1 tablet (90 mg total) by mouth 2 (two) times daily. 04/18/16   Creig Hines, NP  valsartan (DIOVAN) 40 MG tablet Take 40 mg by mouth at bedtime.     [provider]    Family History No family history on file.  Social History Social History   Tobacco Use  . Smoking status: Current Some Day Smoker    Types: Cigars, Cigarettes  . Tobacco comment: Discussed cessation and shared we have classes that he could attend.   Substance  Use Topics  . Alcohol use: Yes    Alcohol/week: 3.0 standard drinks    Types: 1 Shots of liquor, 2 Cans of beer per week  . Drug use: No     Allergies   Lisinopril and Codeine   Review of Systems Review of Systems  Constitutional: Negative for diaphoresis and fever.  HENT: Negative for sore throat.   Eyes: Negative for visual disturbance.  Respiratory: Negative for cough and shortness of breath.   Cardiovascular: Positive for chest pain. Negative for palpitations.  Gastrointestinal: Positive for nausea and vomiting. Negative for abdominal pain.  Genitourinary: Negative for dysuria.  Musculoskeletal: Negative for back pain.  Skin: Negative for rash.  Neurological: Negative for dizziness and headaches.     Physical Exam Updated Vital Signs BP (!) 102/53   Pulse 84   Temp 97.8 F (36.6 C)   Resp 20   SpO2 94%   Physical Exam Vitals signs and nursing note reviewed.  Constitutional:      Appearance: He is well-developed.  HENT:     Head: Normocephalic and  atraumatic.  Eyes:     Conjunctiva/sclera: Conjunctivae normal.  Neck:     Musculoskeletal: Neck supple.  Cardiovascular:     Rate and Rhythm: Normal rate and regular rhythm.     Heart sounds: Normal heart sounds. No murmur.  Pulmonary:     Effort: Pulmonary effort is normal. No respiratory distress.     Breath sounds: Normal breath sounds.  Abdominal:     Palpations: Abdomen is soft.     Tenderness: There is no abdominal tenderness.  Musculoskeletal: Normal range of motion.     Right lower leg: He exhibits no tenderness. No edema.     Left lower leg: He exhibits no tenderness. No edema.  Skin:    General: Skin is warm and dry.     Capillary Refill: Capillary refill takes less than 2 seconds.  Neurological:     General: No focal deficit present.     Mental Status: He is alert and oriented to person, place, and time.      ED Treatments / Results  Labs (all labs ordered are listed, but only abnormal results are displayed) Labs Reviewed  BASIC METABOLIC PANEL - Abnormal; Notable for the following components:      Result Value   CO2 20 (*)    Glucose, Bld 151 (*)    Creatinine, Ser 1.30 (*)    Calcium 8.8 (*)    GFR calc non Af Amer 54 (*)    All other components within normal limits  HEPATIC FUNCTION PANEL - Abnormal; Notable for the following components:   Total Protein 5.7 (*)    Albumin 3.4 (*)    All other components within normal limits  SARS CORONAVIRUS 2 (HOSPITAL ORDER, PERFORMED IN [place] [facility])  CBC  LIPASE, BLOOD  BASIC METABOLIC PANEL  TROPONIN I (HIGH SENSITIVITY)  TROPONIN I (HIGH SENSITIVITY)    EKG EKG Interpretation  Date/Time:  Saturday November 03 2018 10:20:22 EDT Ventricular Rate:  83 PR Interval:    QRS Duration: 103 QT Interval:  391 QTC Calculation: 440 R Axis:   8 Text Interpretation:  Sinus rhythm Multiple premature complexes, vent & supraven Low voltage, precordial leads Consider anterior infarct No significant change since  12/17 Confirmed by Meridee ScoreButler, Ivonne [place]burg [phone]([id number]) on 11/03/2018 10:26:18 AM   Radiology [place] Abdomen Complete  Result Date: 11/04/2018 CLINICAL DATA:  Initial evaluation for acute abdominal pain for 2 days. EXAM:  ABDOMEN ULTRASOUND COMPLETE COMPARISON:  None available. FINDINGS: Gallbladder: Multiple small stones seen within the gallbladder lumen, largest of which measures 5.5 mm. Gallbladder wall minimally thickened to 4 mm, which could be related incomplete distension. No free pericholecystic fluid. No sonographic Murphy sign elicited on exam. Common bile duct: Diameter: 4.8 mm Liver: No focal lesion identified. Mildly increased echogenicity within the hepatic parenchyma, suggesting steatosis. Portal vein is patent on color Doppler imaging with normal direction of blood flow towards the liver. IVC: No abnormality visualized. Pancreas: Not visualized. Spleen: Size and appearance within normal limits. Right Kidney: Length: 12.1 cm. Echogenicity within normal limits. No hydronephrosis. Few scattered simple cyst present, largest of which measures 4.8 x 4.7 x 4.4 cm at the upper pole. Left Kidney: Length: 13.7 mm. Echogenicity within normal limits. No mass or hydronephrosis visualized. Abdominal aorta: No aneurysm visualized. Other findings: None. IMPRESSION: 1. Cholelithiasis with minimal gallbladder wall thickening. No other sonographic features for acute cholecystitis. No biliary dilatation. 2. Mildly increased echogenicity within the patent parenchyma, suggesting steatosis. 3. Scattered simple right renal cysts measuring up to 4.8 cm. 4. Otherwise negative abdominal ultrasound. No other acute abnormality identified. Electronically Signed   By: Jeannine Boga M.D.   On: 11/04/2018 05:06   Dg Chest Port 1 View  Result Date: 11/03/2018 CLINICAL DATA:  Chest pain and shortness of breath. EXAM: PORTABLE CHEST 1 VIEW COMPARISON:  Chest x-ray dated 04/15/2016. FINDINGS: LEFT chest wall pacemaker/ICD apparatus  appears stable in position. Median sternotomy wires appear intact. Lungs are clear. No pleural effusion or pneumothorax seen. No acute appearing osseous abnormality. IMPRESSION: No active disease. No evidence of pneumonia or pulmonary edema. Electronically Signed   By: Franki Cabot M.D.   On: 11/03/2018 10:41    Procedures Procedures (including critical care time)  Medications Ordered in ED Medications  sodium chloride 0.9 % bolus 500 mL (has no administration in time range)     Initial Impression / Assessment and Plan / ED Course  I have reviewed the triage vital signs and the nursing notes.  Pertinent labs & imaging results that were available during my care of the patient were reviewed by me and considered in my medical decision making (see chart for details).  Clinical Course as of Nov 04 554  Sat Jul 18, 376  3424 73 year old male with acute onset of chest pain this morning similar to his prior MIs.  Initial EKG does not show a STEMI.  Differential includes ACS, GERD, pneumothorax, dissection, PE.    [MB]  1035 He is already had aspirin and are giving him a little bit of fluids for this soft blood pressure.  Sats are 94% on room air.  No shortness of breath.   [MB]  7782 Chest x-ray viewed by me.  He has an AICD in what looks like a valve replacement.  There also appears to be what looks like a left lower lobe pneumonia.  He denies any fevers or cough and he says his shortness of breath is at baseline.   [MB]  4235 Patient's initial troponin is 9.  His heart score is a 7 so he will likely need to be admitted for further evaluation.  Radiology read his x-ray as negative.  He said his pain is still a 5 so we will try some morphine.   [MB]  1343 Discussed with [facility] hospitalist Dr. Laren Everts.  He will evaluate the patient for admission.  He asked if I would put a consult into cardiology.  Discussed with Dr. Jens Somrenshaw from cardiology who will consult on the patient.   [MB]  1434 Dr.  Rennis GoldenHilty from cardiology has seen the patient and he does not feel this is cardiac.  He got more of upper abdominal pain in the setting of being a drinker and recommended that we add some LFTs and a lipase.   [MB]    Clinical Course User Index [MB] Terrilee FilesButler, Boykin Baetz C, MD    [facility] 7  Final Clinical Impressions(s) / ED Diagnoses   Final diagnoses:  Nonspecific chest pain    ED Discharge Orders    None       Terrilee FilesButler, Lark Runk C, MD 11/04/18 [phone]

## 2018-11-03 NOTE — Progress Notes (Signed)
  Echocardiogram 2D Echocardiogram has been performed.  Stephen Macdonald 11/03/2018, 5:17 PM

## 2018-11-03 NOTE — ED Triage Notes (Signed)
Per EMS- pt woke up w chest pain, took nitro X 2 with no relief. Nausea and vomited once. Pt has cardiac hx of cath, BBB, pacemaker. Pt had 324 Asprin. BP 88/60 PIV placed to [place]. Chest pain is centralized and non radiating.

## 2018-11-03 NOTE — ED Notes (Signed)
[place] wife

## 2018-11-03 NOTE — Progress Notes (Signed)
Pt given Maalox as PRN medication for his c/o epigastric pain. Pt had indicated he feels a lot better, a lot better  Even when he was given pain medication per pt.

## 2018-11-03 NOTE — H&P (Signed)
[facility]                                                                                    Patient Demographics  Stephen Macdonald, is a 73 y.o. male  CSN: [id number]  MRN: [id number]  DOB - Jun 08, 1945  Admit Date - 11/03/2018  Outpatient Primary MD for the patient is Lucia Gaskins, MD   With History of -  Past Medical History:  Diagnosis Date  . Chronic combined systolic and diastolic CHF, NYHA class 3 ([place])    a. 01/2015 Echo: EF 35-30%; b. 03/2016 Echo: Ef 20%, Gr1DD, mid-apical anterior, mid anteroseptal, apical inferior, basal inferolateral, mid anterolateral, and apical AK, basal anteroseptal and mid inferior severe HK, midlly dil LA, mild TR.  . CKD (chronic kidney disease), stage III ([place]) 04/15/2016  . Coronary artery disease    a. 1997 s/p CABG;  b. 01/2015 MI/PCI: G->PDA;  c. 03/2016 NSTEMI/PCI: LM 20, LAD 136m, D2 80, RI 60, LCX ok, OM1 90, RCA 60p, 133m, RPAV 80, G->RPDA 15p ISR, 90d (3.5x28 Promus Premier DES), LIMA->OM1->dLAD nl.  . High cholesterol   . Hypertension   . Ischemic cardiomyopathy    a. 01/2015 Echo: EF 25-30%; b. s/p AICD;  c. 03/2016 Echo: Ef 20%, Gr1DD.  Marland Kitchen Smoker       Past Surgical History:  Procedure Laterality Date  . CARDIAC CATHETERIZATION N/A 02/12/2015   Procedure: Left Heart Cath and Coronary Angiography;  Surgeon: Jettie Booze, MD;  Location: [facility];  Service: Cardiovascular;  Laterality: N/A;  . CARDIAC CATHETERIZATION N/A 02/12/2015   Procedure: Coronary Stent Intervention;  Surgeon: Jettie Booze, MD;  Location: [facility];  Service: Cardiovascular;  Laterality: N/A;  . CARDIAC CATHETERIZATION  02/12/2015   Procedure: Left Heart Cath and Cors/Grafts Angiography;  Surgeon: Jettie Booze, MD;  Location: [facility];  Service: Cardiovascular;;  . CARDIAC CATHETERIZATION N/A 04/16/2016   Procedure: LEFT HEART CATH AND CORS/GRAFTS ANGIOGRAPHY;  Surgeon: Nelva Bush, MD;   Location: [facility];  Service: Cardiovascular;  Laterality: N/A;  . CARDIAC CATHETERIZATION N/A 04/16/2016   Procedure: Coronary Stent Intervention;  Surgeon: Nelva Bush, MD;  Location: [facility];  Service: Cardiovascular;  Laterality: N/A;  . CARDIAC DEFIBRILLATOR PLACEMENT    . CORONARY ARTERY BYPASS GRAFT     x 3  . CORONARY STENT PLACEMENT    . PACEMAKER INSERTION      in for   Chief Complaint  Patient presents with  . Chest Pain     HPI  Stephen Macdonald  is a 73 y.o. male, with past medical history significant for systolic congestive heart failure at 20% coronary artery disease status post CABG in 1997 followed by stent placement in 2016 and 2017 at our facility presenting here with 1 day history of chest pain at the central chest/ epigastric area radiating to the back associated with nausea but no vomiting, no shortness of breath or cold sweats.  The pain has been constant since early this a.m. waxing and waning.  Partially relieved by nitroglycerin sublingual.  The pain started at around 2 and went  up to 9 and now is at 6. Emergency room work-up showed mild renal insufficiency, stage II troponin 9/10. EKG shows normal sinus rhythm at 83 bpm with PVCs and old anterior MI. Echocardiogram done in 2017 showed ejection fraction of 20% Cardiology was consulted since patient is still having ongoing chest pain   Review of Systems    In addition to the HPI above,  No Fever-chills, No Headache, No changes with Vision or hearing, No problems swallowing food or Liquids, No Cough or Shortness of Breath, No Abdominal pain, No Nausea or Vommitting, Bowel movements are regular, No Blood in stool or Urine, No dysuria, No new skin rashes or bruises, No new joints pains-aches,  No new weakness, tingling, numbness in any extremity, No recent weight gain or loss, No polyuria, polydypsia or polyphagia, No significant Mental Stressors.  A full 10 point Review of  Systems was done, except as stated above, all other Review of Systems were negative.   Social History Social History   Tobacco Use  . Smoking status: Current Some Day Smoker    Types: Cigars, Cigarettes  . Tobacco comment: Discussed cessation and shared we have classes that he could attend.   Substance Use Topics  . Alcohol use: Yes    Alcohol/week: 3.0 standard drinks    Types: 1 Shots of liquor, 2 Cans of beer per week     Family History No family history on file.   Prior to Admission medications   Medication Sig Start Date End Date Taking? Authorizing Provider  aspirin EC 81 MG tablet Take 81 mg by mouth every morning.    Yes [provider]  atorvastatin (LIPITOR) 80 MG tablet Take 1 tablet (80 mg total) by mouth daily. Patient taking differently: Take 80 mg by mouth at bedtime.  02/16/15  Yes Azalee CourseMeng, Hao, PA  cholecalciferol (VITAMIN D) 1000 UNITS tablet Take 1,000 Units by mouth at bedtime.    Yes [provider]  clopidogrel (PLAVIX) 75 MG tablet Take 75 mg by mouth daily. 08/01/18  Yes [provider]  ezetimibe (ZETIA) 10 MG tablet Take 10 mg by mouth daily. 08/01/18  Yes [provider]  furosemide (LASIX) 40 MG tablet Take 1 tablet (40 mg total) by mouth daily. Patient taking differently: Take 20 mg by mouth daily.  04/18/16  Yes Creig HinesBerge, Christopher Ronald, NP  metoprolol succinate (TOPROL-XL) 25 MG 24 hr tablet Take 25 mg by mouth every evening.    Yes [provider]  nitroGLYCERIN (NITROSTAT) 0.4 MG SL tablet Place 1 tablet (0.4 mg total) under the tongue every 5 (five) minutes as needed for chest pain. 02/16/15  Yes Azalee CourseMeng, Hao, PA  tamsulosin (FLOMAX) 0.4 MG CAPS capsule Take 0.4 mg by mouth every morning.   Yes [provider]  valsartan (DIOVAN) 40 MG tablet Take 40 mg by mouth at bedtime.    Yes [provider]  potassium chloride 20 MEQ TBCR Take 20 mEq by mouth daily. Patient not taking: Reported on  11/03/2018 04/18/16   Creig HinesBerge, Christopher Ronald, NP  ticagrelor (BRILINTA) 90 MG TABS tablet Take 1 tablet (90 mg total) by mouth 2 (two) times daily. Patient not taking: Reported on 11/03/2018 04/18/16   Creig HinesBerge, Christopher Ronald, NP    Allergies  Allergen Reactions  . Lisinopril Cough  . Codeine Other (See Comments)    Tingling skin. "makes my skin crawl"    Physical Exam  Vitals  Blood pressure (!) 98/51, pulse 68, temperature 97.8 F (36.6  C), resp. rate 18, SpO2 97 %.  General appearance no acute distress at this time awake alert oriented x3/pleasant HEENT no jaundice or pallor, no facial deviation, no oral thrush Neck supple, no neck vein distention Chest clear and resonant with decreased breath sounds at the bases Heart normal S1-S2 with no murmurs gallops or rubs Abdomen soft, mild epigastric tenderness which reproduces the chest pain Extremities no clubbing cyanosis or edema Skin no rashes or ulcers Neuro alert awake oriented x3 with no focal weaknesses Psych no homicidal or suicidal ideation  Data Review  CBC Recent Labs  Lab 11/03/18 1044  WBC 8.9  HGB 14.7  HCT 41.9  PLT 194  MCV 93.7  MCH 32.9  MCHC 35.1  RDW 11.9   ------------------------------------------------------------------------------------------------------------------  Chemistries  Recent Labs  Lab 11/03/18 1044  NA 138  K 4.3  CL 108  CO2 20*  GLUCOSE 151*  BUN 13  CREATININE 1.30*  CALCIUM 8.8*   ------------------------------------------------------------------------------------------------------------------ CrCl cannot be calculated (Unknown ideal weight.). ------------------------------------------------------------------------------------------------------------------ No results for input(s): TSH, T4TOTAL, T3FREE, THYROIDAB in the last 72 hours.  Invalid input(s): FREET3   Coagulation profile No results for input(s): INR, PROTIME in the last 168  hours. ------------------------------------------------------------------------------------------------------------------- No results for input(s): DDIMER in the last 72 hours. -------------------------------------------------------------------------------------------------------------------  Cardiac Enzymes No results for input(s): CKMB, TROPONINI, MYOGLOBIN in the last 168 hours.  Invalid input(s): CK ------------------------------------------------------------------------------------------------------------------ Invalid input(s): POCBNP   ---------------------------------------------------------------------------------------------------------------  Urinalysis No results found for: COLORURINE, APPEARANCEUR, LABSPEC, PHURINE, GLUCOSEU, HGBUR, BILIRUBINUR, KETONESUR, PROTEINUR, UROBILINOGEN, NITRITE, LEUKOCYTESUR  ----------------------------------------------------------------------------------------------------------------   Imaging results:   Dg Chest Port 1 View  Result Date: 11/03/2018 CLINICAL DATA:  Chest pain and shortness of breath. EXAM: PORTABLE CHEST 1 VIEW COMPARISON:  Chest x-ray dated 04/15/2016. FINDINGS: LEFT chest wall pacemaker/ICD apparatus appears stable in position. Median sternotomy wires appear intact. Lungs are clear. No pleural effusion or pneumothorax seen. No acute appearing osseous abnormality. IMPRESSION: No active disease. No evidence of pneumonia or pulmonary edema. Electronically Signed   By: Bary Richard M.D.   On: 11/03/2018 10:41    My personal review of EKG: Normal sinus rhythm at 83 bpm/PVCs/old anterior MI  Assessment & Plan  Chest pain/epigastric pain No acute changes by EKG.  Seen by cardiology/probably atypical Troponins flat Monitor tonight and probable discharge in a.m. to follow-up at the [facility] Check lipase/U/S a per cardiology  History of coronary artery disease status post CABG in 1997 and stents placed in  2016/2017  Combined systolic/diastolic congestive heart failure Continue with beta-blocker/ARB  Hyperlipidemia Continue with Lipitor   DVT Prophylaxis Lovenox  AM Labs Ordered, also please review Full Orders  Family Communication: called Monica  Code Status full  Disposition Plan: home  Time spent in minutes : 42 min  Condition GUARDED   @SIGNATURE @

## 2018-11-03 NOTE — Consult Note (Signed)
CONSULTATION NOTE   Patient Name: Stephen Macdonald Date of Encounter: 11/03/2018 Cardiologist: No primary care provider on file.  Chief Complaint   Chest pain  Patient Profile   [age] yo male with CAD and prior CABG in 1997, multiple NSTEMI's and coronary interventions, last in 03/2016 [facility], presents with CP that awakened him from sleep  HPI   Stephen Macdonald is a 73 y.o. male who is being seen today for the evaluation of chest pain at the request of Dr. Charm BargesButler. This is a 73 yo male who is [facility] with history of CAD s/p CABG x 3 in 1997 (LIMA to LAD, free radial "Y" to OM1 from the LIMA, SVG to PDA), ICM with LVEF 25-35% s/p AICD and most recent coronary [facility] for NSTEMI with PCI to distal SVG to PDA stenosis. Echo showed LVEF 20%. After discharge he apparently has resumed [facility]. He has an upcoming appt with them in a few weeks. He is also due for an ICD check and says he is close to ERI.  He presents today after awakening with "chest pain" or more accurately upper mid-epigastric pain. This is sharp, centrally located and radiates to his back. Quality is different from his angina in the past which was across his upper chest and into his left arm - previously responsive to nitro, this did not improve with nitro. He was nauseated and vomited once, then dry-heaved. Denies and CHF symptoms such as dyspnea, orthopnea, etc -- labs suggest possible dehydration with elevated Cr of 1.3. Initial 2 HS-troponins were negative (9,10). EKG personally reviewed and unchanged from prior - no ischemia. BP low normal to soft. COVID negative. CXR clear. He reports the pain is still persisting, even after morphine and IV fluids- rates it a 5/10 (improved from 9-10/10).  PMHx   Past Medical History:  Diagnosis Date  . Chronic combined systolic and diastolic CHF, NYHA class 3 (HCC)    a. 01/2015 Echo: EF 35-30%; b. 03/2016  Echo: Ef 20%, Gr1DD, mid-apical anterior, mid anteroseptal, apical inferior, basal inferolateral, mid anterolateral, and apical AK, basal anteroseptal and mid inferior severe HK, midlly dil LA, mild TR.  . CKD (chronic kidney disease), stage III (HCC) 04/15/2016  . Coronary artery disease    a. 1997 s/p CABG;  b. 01/2015 MI/PCI: G->PDA;  c. 03/2016 NSTEMI/PCI: LM 20, LAD 14673m, D2 80, RI 60, LCX ok, OM1 90, RCA 60p, 15273m, RPAV 80, G->RPDA 15p ISR, 90d (3.5x28 Promus Premier DES), LIMA->OM1->dLAD nl.  . High cholesterol   . Hypertension   . Ischemic cardiomyopathy    a. 01/2015 Echo: EF 25-30%; b. s/p AICD;  c. 03/2016 Echo: Ef 20%, Gr1DD.  Marland Kitchen. Smoker     Past Surgical History:  Procedure Laterality Date  . CARDIAC CATHETERIZATION N/A 02/12/2015   Procedure: Left Heart Cath and Coronary Angiography;  Surgeon: Corky CraftsJayadeep S Varanasi, MD;  [facility];  Service: Cardiovascular;  Laterality: N/A;  . CARDIAC CATHETERIZATION N/A 02/12/2015   Procedure: Coronary Stent Intervention;  Surgeon: Corky CraftsJayadeep S Varanasi, MD;  [facility];  Service: Cardiovascular;  Laterality: N/A;  . CARDIAC CATHETERIZATION  02/12/2015   Procedure: Left Heart Cath and Cors/Grafts Angiography;  Surgeon: Corky CraftsJayadeep S Varanasi, MD;  [facility];  Service: Cardiovascular;;  . CARDIAC CATHETERIZATION N/A 04/16/2016   Procedure: LEFT HEART CATH AND CORS/GRAFTS ANGIOGRAPHY;  Surgeon:  Yvonne Kendallhristopher End, MD;  Location: [facility] INV[facility] CV LAB;  Service: Cardiovascular;  Laterality: N/A;  . CARDIAC CATHETERIZATION N/A 04/16/2016   Procedure: Coronary Stent Intervention;  Surgeon: Yvonne Kendallhristopher End, MD;  Location: [facility] INVASIVE CV LAB;  Service: Cardiovascular;  Laterality: N/A;  . CARDIAC DEFIBRILLATOR PLACEMENT    . CORONARY ARTERY BYPASS GRAFT     x 3  . CORONARY STENT PLACEMENT    . PACEMAKER INSERTION      FAMHx   No family history on file.  SOCHx    reports that he has been smoking  cigars and cigarettes. He does not have any smokeless tobacco history on file. He reports current alcohol use of about 3.0 standard drinks of alcohol per week. He reports that he does not use drugs.  Outpatient Medications   No current facility-administered medications on file prior to encounter.    Current Outpatient Medications on File Prior to Encounter  Medication Sig Dispense Refill  . aspirin EC 81 MG tablet Take 81 mg by mouth every morning.     Marland Kitchen. atorvastatin (LIPITOR) 80 MG tablet Take 1 tablet (80 mg total) by mouth daily. (Patient taking differently: Take 80 mg by mouth at bedtime. ) 30 tablet 11  . cholecalciferol (VITAMIN D) 1000 UNITS tablet Take 1,000 Units by mouth at bedtime.     . clopidogrel (PLAVIX) 75 MG tablet Take 75 mg by mouth daily.    Marland Kitchen. ezetimibe (ZETIA) 10 MG tablet Take 10 mg by mouth daily.    . furosemide (LASIX) 40 MG tablet Take 1 tablet (40 mg total) by mouth daily. (Patient taking differently: Take 20 mg by mouth daily. ) 30 tablet 3  . metoprolol succinate (TOPROL-XL) 25 MG 24 hr tablet Take 25 mg by mouth every evening.     . nitroGLYCERIN (NITROSTAT) 0.4 MG SL tablet Place 1 tablet (0.4 mg total) under the tongue every 5 (five) minutes as needed for chest pain. 25 tablet 3  . tamsulosin (FLOMAX) 0.4 MG CAPS capsule Take 0.4 mg by mouth every morning.    . valsartan (DIOVAN) 40 MG tablet Take 40 mg by mouth at bedtime.     . potassium chloride 20 MEQ TBCR Take 20 mEq by mouth daily. (Patient not taking: Reported on 11/03/2018) 30 tablet 3  . ticagrelor (BRILINTA) 90 MG TABS tablet Take 1 tablet (90 mg total) by mouth 2 (two) times daily. (Patient not taking: Reported on 11/03/2018) 60 tablet 6    Inpatient Medications    Scheduled Meds:   Continuous Infusions:   PRN Meds:    ALLERGIES   Allergies  Allergen Reactions  . Lisinopril Cough  . Codeine Other (See Comments)    Tingling skin. "makes my skin crawl"    ROS   Pertinent items noted  in HPI and remainder of comprehensive ROS otherwise negative.  Vitals   Vitals:   11/03/18 1245 11/03/18 1300 11/03/18 1330 11/03/18 1400  BP: 119/72 117/70 121/79 (!) 98/51  Pulse: (!) 59 61 74 68  Resp: 20 18 18 18   Temp:      SpO2: 99% 99% 97% 97%    Intake/Output Summary (Last 24 hours) at 11/03/2018 1424 Last data filed at 11/03/2018 1208 Gross per 24 hour  Intake 500 ml  Output -  Net 500 ml   There were no vitals filed for this visit.  Physical Exam   General appearance: alert, no distress and mildly obese Neck: no carotid bruit, no JVD and thyroid not enlarged,  symmetric, no tenderness/mass/nodules Lungs: clear to auscultation bilaterally Heart: regular rate and rhythm, S1, S2 normal, no murmur, click, rub or gallop Abdomen: mild TTP over the subxiphoid, mid-epigastric area, no rebound but + voluntary guarding Extremities: extremities normal, atraumatic, no cyanosis or edema Pulses: 2+ and symmetric Skin: Skin color, texture, turgor normal. No rashes or lesions Neurologic: Grossly normal Psych: uncomfortable  Labs   Results for orders placed or performed during the hospital encounter of 11/03/18 (from the past 48 hour(s))  Basic metabolic panel     Status: Abnormal   Collection Time: 11/03/18 10:44 AM  Result Value Ref Range   Sodium 138 135 - 145 mmol/L   Potassium 4.3 3.5 - 5.1 mmol/L   Chloride 108 98 - 111 mmol/L   CO2 20 (L) 22 - 32 mmol/L   Glucose, Bld 151 (H) 70 - 99 mg/dL   BUN 13 8 - 23 mg/dL   Creatinine, Ser 1.30 (H) 0.61 - 1.24 mg/dL   Calcium 8.8 (L) 8.9 - 10.3 mg/dL   GFR calc non Af Amer 54 (L) >60 mL/min   GFR calc Af Amer >60 >60 mL/min   Anion gap 10 5 - 15    Comment: Performed at [facility], [address] [place] [address]  CBC     Status: None   Collection Time: 11/03/18 10:44 AM  Result Value Ref Range   WBC 8.9 4.0 - 10.5 K/uL   RBC 4.47 4.22 - 5.81 MIL/uL   Hemoglobin 14.7 13.0 - 17.0 g/dL   HCT 41.9 39.0 -  52.0 %   MCV 93.7 80.0 - 100.0 fL   MCH 32.9 26.0 - 34.0 pg   MCHC 35.1 30.0 - 36.0 g/dL   RDW 11.9 11.5 - 15.5 %   Platelets 194 150 - 400 K/uL   nRBC 0.0 0.0 - 0.2 %    Comment: Performed at [facility], [place] [address] [place], [place] [address]  Troponin I (High Sensitivity)     Status: None   Collection Time: 11/03/18 10:44 AM  Result Value Ref Range   Troponin I (High Sensitivity) 9 <18 ng/L    Comment: (NOTE) Elevated high sensitivity troponin I (hsTnI) values and significant  changes across serial measurements may suggest ACS but many other  chronic and acute conditions are known to elevate hsTnI results.  Refer to the "Links" section for chest pain algorithms and additional  guidance. Performed at [facility], [place] [address], [place], [place] [address]   SARS Coronavirus 2 (CEPHEID - Performed in [place] hospital lab), Hosp Order     Status: None   Collection Time: 11/03/18 10:55 AM   Specimen: Nasopharyngeal Swab  Result Value Ref Range   SARS Coronavirus 2 NEGATIVE NEGATIVE    Comment: (NOTE) If result is NEGATIVE SARS-CoV-2 target nucleic acids are NOT DETECTED. The SARS-CoV-2 RNA is generally detectable in upper and lower  respiratory specimens during the acute phase of infection. The lowest  concentration of SARS-CoV-2 viral copies this assay can detect is 250  copies / mL. A negative result does not preclude SARS-CoV-2 infection  and should not be used as the sole basis for treatment or other  patient management decisions.  A negative result may occur with  improper specimen collection / handling, submission of specimen other  than nasopharyngeal swab, presence of viral mutation(s) within the  areas targeted by this assay, and inadequate number of viral copies  (<250 copies /  mL). A negative result must be combined with clinical  observations, patient history, and epidemiological information. If result is POSITIVE SARS-CoV-2 target nucleic  acids are DETECTED. The SARS-CoV-2 RNA is generally detectable in upper and lower  respiratory specimens dur ing the acute phase of infection.  Positive  results are indicative of active infection with SARS-CoV-2.  Clinical  correlation with patient history and other diagnostic information is  necessary to determine patient infection status.  Positive results do  not rule out bacterial infection or co-infection with other viruses. If result is PRESUMPTIVE POSTIVE SARS-CoV-2 nucleic acids MAY BE PRESENT.   A presumptive positive result was obtained on the submitted specimen  and confirmed on repeat testing.  While 2019 novel coronavirus  (SARS-CoV-2) nucleic acids may be present in the submitted sample  additional confirmatory testing may be necessary for epidemiological  and / or clinical management purposes  to differentiate between  SARS-CoV-2 and other Sarbecovirus currently known to infect humans.  If clinically indicated additional testing with an alternate test  methodology [phone]) is advised. The SARS-CoV-2 RNA is generally  detectable in upper and lower respiratory sp ecimens during the acute  phase of infection. The expected result is Negative. Fact Sheet for Patients:  BoilerBrush.com.cy Fact Sheet for Healthcare Providers: https://pope.com/ This test is not yet approved or cleared by the [place] FDA and has been authorized for detection and/or diagnosis of SARS-CoV-2 by FDA under an Emergency Use Authorization (EUA).  This EUA will remain in effect (meaning this test can be used) for the duration of the COVID-19 declaration under Section 564(b)(1) of the Act, 21 U.S.C. section 360bbb-3(b)(1), unless the authorization is terminated or revoked sooner. Performed at [facility], [address], [place], [place] [address]   Troponin I (High Sensitivity)     Status: None   Collection Time: 11/03/18 12:27 PM   Result Value Ref Range   Troponin I (High Sensitivity) 10 <18 ng/L    Comment: (NOTE) Elevated high sensitivity troponin I (hsTnI) values and significant  changes across serial measurements may suggest ACS but many other  chronic and acute conditions are known to elevate hsTnI results.  Refer to the "Links" section for chest pain algorithms and additional  guidance. Performed at [facility], [address], [place], [place] [address]     ECG   Sinus rhythm at 83, paced beats, no ischemia - Personally Reviewed  Telemetry   Sinus rhythm - Personally Reviewed  Radiology   Dg Chest Port 1 View  Result Date: 11/03/2018 CLINICAL DATA:  Chest pain and shortness of breath. EXAM: PORTABLE CHEST 1 VIEW COMPARISON:  Chest x-ray dated 04/15/2016. FINDINGS: LEFT chest wall pacemaker/ICD apparatus appears stable in position. Median sternotomy wires appear intact. Lungs are clear. No pleural effusion or pneumothorax seen. No acute appearing osseous abnormality. IMPRESSION: No active disease. No evidence of pneumonia or pulmonary edema. Electronically Signed   By: Bary Richard M.D.   On: 11/03/2018 10:41    Cardiac Studies   N/A  Impression   Active Problems:   Midepigastric pain   Recommendation   1. Mr. Richman has upper mid-epigastric pain associated with 2 episodes of N/V. The quality, location and non-response to nitro is much different that previous presentations. HS-Troponin x 2 is negative. I do not suspect this is cardiac pain. This could be pancreatitis or perhaps gastric ulcer. He reports moderate alcohol use. Consider adding lipase or imaging if appropriate. Appears euvolemic on exam. Would  recommend he [facility] in [facility]-2 weeks as scheduled.  Thanks for the consultation.  Time Spent Directly with Patient:  I have spent a total of 45 minutes with the patient reviewing hospital notes, telemetry, EKGs, labs and examining the patient as well as  establishing an assessment and plan that was discussed personally with the patient.  > 50% of time was spent in direct patient care.  Length of Stay:  LOS: 0 days   Chrystie NoseKenneth C. Hilty, MD, [facility]ACC, FACP  [place]  [facility]  Medical Director of the [facility] Lipid [facility]  Cardiovascular Risk Reduction [facility] Diplomate of the American Board of Clinical Lipidology Attending Cardiologist  Direct Dial: [phone]  Fax: [phone]  Website:  [email or web address]   Kenneth C Hilty 11/03/2018, 2:24 PM

## 2018-11-04 ENCOUNTER — Observation Stay (HOSPITAL_COMMUNITY): Payer: Medicare Other

## 2018-11-04 ENCOUNTER — Inpatient Hospital Stay (HOSPITAL_COMMUNITY): Payer: Medicare Other

## 2018-11-04 DIAGNOSIS — N400 Enlarged prostate without lower urinary tract symptoms: Secondary | ICD-10-CM | POA: Diagnosis present

## 2018-11-04 DIAGNOSIS — E785 Hyperlipidemia, unspecified: Secondary | ICD-10-CM | POA: Diagnosis present

## 2018-11-04 DIAGNOSIS — Z01810 Encounter for preprocedural cardiovascular examination: Secondary | ICD-10-CM | POA: Diagnosis not present

## 2018-11-04 DIAGNOSIS — Z1159 Encounter for screening for other viral diseases: Secondary | ICD-10-CM | POA: Diagnosis not present

## 2018-11-04 DIAGNOSIS — Z7982 Long term (current) use of aspirin: Secondary | ICD-10-CM | POA: Diagnosis not present

## 2018-11-04 DIAGNOSIS — Z951 Presence of aortocoronary bypass graft: Secondary | ICD-10-CM | POA: Diagnosis not present

## 2018-11-04 DIAGNOSIS — I255 Ischemic cardiomyopathy: Secondary | ICD-10-CM | POA: Diagnosis present

## 2018-11-04 DIAGNOSIS — E860 Dehydration: Secondary | ICD-10-CM | POA: Diagnosis present

## 2018-11-04 DIAGNOSIS — Z86010 Personal history of colonic polyps: Secondary | ICD-10-CM | POA: Diagnosis not present

## 2018-11-04 DIAGNOSIS — R945 Abnormal results of liver function studies: Secondary | ICD-10-CM | POA: Diagnosis present

## 2018-11-04 DIAGNOSIS — Z7902 Long term (current) use of antithrombotics/antiplatelets: Secondary | ICD-10-CM | POA: Diagnosis not present

## 2018-11-04 DIAGNOSIS — F17290 Nicotine dependence, other tobacco product, uncomplicated: Secondary | ICD-10-CM | POA: Diagnosis present

## 2018-11-04 DIAGNOSIS — R1013 Epigastric pain: Secondary | ICD-10-CM

## 2018-11-04 DIAGNOSIS — I10 Essential (primary) hypertension: Secondary | ICD-10-CM | POA: Diagnosis not present

## 2018-11-04 DIAGNOSIS — N183 Chronic kidney disease, stage 3 (moderate): Secondary | ICD-10-CM | POA: Diagnosis present

## 2018-11-04 DIAGNOSIS — I5023 Acute on chronic systolic (congestive) heart failure: Secondary | ICD-10-CM | POA: Diagnosis not present

## 2018-11-04 DIAGNOSIS — K8062 Calculus of gallbladder and bile duct with acute cholecystitis without obstruction: Secondary | ICD-10-CM | POA: Diagnosis present

## 2018-11-04 DIAGNOSIS — K8000 Calculus of gallbladder with acute cholecystitis without obstruction: Secondary | ICD-10-CM | POA: Diagnosis not present

## 2018-11-04 DIAGNOSIS — R079 Chest pain, unspecified: Secondary | ICD-10-CM | POA: Diagnosis present

## 2018-11-04 DIAGNOSIS — I252 Old myocardial infarction: Secondary | ICD-10-CM | POA: Diagnosis not present

## 2018-11-04 DIAGNOSIS — E876 Hypokalemia: Secondary | ICD-10-CM | POA: Diagnosis not present

## 2018-11-04 DIAGNOSIS — Z885 Allergy status to narcotic agent status: Secondary | ICD-10-CM | POA: Diagnosis not present

## 2018-11-04 DIAGNOSIS — N179 Acute kidney failure, unspecified: Secondary | ICD-10-CM | POA: Diagnosis present

## 2018-11-04 DIAGNOSIS — I5022 Chronic systolic (congestive) heart failure: Secondary | ICD-10-CM | POA: Diagnosis not present

## 2018-11-04 DIAGNOSIS — I5042 Chronic combined systolic (congestive) and diastolic (congestive) heart failure: Secondary | ICD-10-CM | POA: Diagnosis present

## 2018-11-04 DIAGNOSIS — I130 Hypertensive heart and chronic kidney disease with heart failure and stage 1 through stage 4 chronic kidney disease, or unspecified chronic kidney disease: Secondary | ICD-10-CM | POA: Diagnosis present

## 2018-11-04 DIAGNOSIS — R1010 Upper abdominal pain, unspecified: Secondary | ICD-10-CM | POA: Diagnosis not present

## 2018-11-04 DIAGNOSIS — Z955 Presence of coronary angioplasty implant and graft: Secondary | ICD-10-CM | POA: Diagnosis not present

## 2018-11-04 DIAGNOSIS — I2510 Atherosclerotic heart disease of native coronary artery without angina pectoris: Secondary | ICD-10-CM | POA: Diagnosis present

## 2018-11-04 DIAGNOSIS — Z888 Allergy status to other drugs, medicaments and biological substances status: Secondary | ICD-10-CM | POA: Diagnosis not present

## 2018-11-04 DIAGNOSIS — I493 Ventricular premature depolarization: Secondary | ICD-10-CM | POA: Diagnosis present

## 2018-11-04 DIAGNOSIS — Z95810 Presence of automatic (implantable) cardiac defibrillator: Secondary | ICD-10-CM | POA: Diagnosis not present

## 2018-11-04 LAB — ECHOCARDIOGRAM COMPLETE
Height: 72 in
Weight: 3776 oz

## 2018-11-04 LAB — BASIC METABOLIC PANEL
Anion gap: 7 (ref 5–15)
BUN: 13 mg/dL (ref 8–23)
CO2: 24 mmol/L (ref 22–32)
Calcium: 8.7 mg/dL — ABNORMAL LOW (ref 8.9–10.3)
Chloride: 105 mmol/L (ref 98–111)
Creatinine, Ser: 1.19 mg/dL (ref 0.61–1.24)
GFR calc Af Amer: >60 mL/min (ref >60)
GFR calc non Af Amer: >60 mL/min (ref >60)
Glucose, Bld: 122 mg/dL — ABNORMAL HIGH (ref 70–99)
Potassium: 4 mmol/L (ref 3.5–5.1)
Sodium: 136 mmol/L (ref 135–145)

## 2018-11-04 MED ORDER — IOHEXOL 300 MG/ML  SOLN
100.0000 mL | Freq: Once | INTRAMUSCULAR | Status: AC | PRN
  Administered 2018-11-04: 100 mL via INTRAVENOUS

## 2018-11-04 MED ORDER — DICYCLOMINE HCL 20 MG PO TABS
20.0000 mg | ORAL_TABLET | Freq: Once | ORAL | Status: AC
  Administered 2018-11-04: 20:00:00 20 mg via ORAL
  Filled 2018-11-04: qty 1

## 2018-11-04 MED ORDER — DICYCLOMINE HCL 20 MG PO TABS
20.0000 mg | ORAL_TABLET | Freq: Once | ORAL | Status: AC
  Administered 2018-11-04: 20 mg via ORAL
  Filled 2018-11-04: qty 1

## 2018-11-04 MED ORDER — PANTOPRAZOLE SODIUM 20 MG PO TBEC
20.0000 mg | DELAYED_RELEASE_TABLET | Freq: Every day | ORAL | Status: DC
  Administered 2018-11-04 – 2018-11-07 (×4): 20 mg via ORAL
  Filled 2018-11-04 (×4): qty 1

## 2018-11-04 NOTE — Progress Notes (Signed)
PROGRESS NOTE    Stephen Macdonald  XHB:[id number] DOB: 02-08-1946 DOA: 11/03/2018 PCP: Lucia Gaskins, MD  Outpatient Specialists:   Brief Narrative:  Patient is a 73 year old male with history of coronary artery disease, ischemic cardiomyopathy with EF of 20%, hypertension, hyperlipidemia, tobacco use and prior documentation of CKD 3.  Patient was admitted with lower sternal and epigastric pain.  Troponin has been flat, and within normal range.  Patient has been seen by cardiology team and pain thought to be noncardiac.  Echocardiogram done on presentation has continued to reveal EF of 20 to 25%.  GI input is appreciated.  CT scan of the abdomen and pelvis recommended.  Lipase is within normal range.  Further management will depend on hospital course.  Assessment & Plan:   Active Problems:   Midepigastric pain   Chest pain   Chest pain: Negative cardiac enzymes. Stable echocardiogram. Cardiology input appreciated. Pain thought not to be cardiac in origin.  Epigastric pain: -GI consulted. -Awaiting CT scan of the abdomen and pelvis. -Abdominal ultrasound revealed cholelithiasis with minimal gallbladder wall thickening, scattered simple renal cysts measuring up to 4.8 cm. -PPI recommended.  Systolic CHF/ischemic cardiomyopathy: No symptoms of CHF Repeat echo reveals EF of 20 to 25% (same as in 2017)  Chronic kidney disease stage III: Renal function is improved today. Continue to monitor closely.  Hypertension: Controlled. Continue to monitor closely.   DVT prophylaxis: Subacute Lovenox Code Status: Full code Family Communication:  Disposition Plan: Eventually   Consultants:   Cardiology  GI  Procedures:   Echocardiogram  Antimicrobials:   None   Subjective: No chest pain. Epigastric pain has improved significantly.  Objective: Vitals:   11/04/18 0130 11/04/18 0621 11/04/18 0900 11/04/18 1400  BP:    112/68  Pulse:      Resp:   20   Temp:     (!) 97.5 F (36.4 C)  TempSrc:    Oral  SpO2: 94%   93%  Weight:  108.2 kg    Height:        Intake/Output Summary (Last 24 hours) at 11/04/2018 1735 Last data filed at 11/04/2018 1300 Gross per 24 hour  Intake 270 ml  Output 1425 ml  Net -1155 ml   Filed Weights   11/03/18 1605 11/04/18 0621  Weight: 107 kg 108.2 kg    Examination:  General exam: Appears calm and comfortable  Respiratory system: Clear to auscultation. Cardiovascular system: S1 & S2  Gastrointestinal system: Abdomen is obese, soft and nontender.  Central nervous system: Alert and oriented.  Patient moves all extremities.   Extremities: No leg edema.  Data Reviewed: I have personally reviewed following labs and imaging studies  CBC: Recent Labs  Lab 11/03/18 1044  WBC 8.9  HGB 14.7  HCT 41.9  MCV 93.7  PLT 810   Basic Metabolic Panel: Recent Labs  Lab 11/03/18 1044 11/04/18 0755  NA 138 136  K 4.3 4.0  CL 108 105  CO2 20* 24  GLUCOSE 151* 122*  BUN 13 13  CREATININE 1.30* 1.19  CALCIUM 8.8* 8.7*   GFR: Estimated Creatinine Clearance: 70.2 mL/min (by C-G formula based on SCr of 1.19 mg/dL). Liver Function Tests: Recent Labs  Lab 11/03/18 1227  AST 19  ALT 28  ALKPHOS 57  BILITOT 1.0  PROT 5.7*  ALBUMIN 3.4*   Recent Labs  Lab 11/03/18 1227  LIPASE 24   No results for input(s): AMMONIA in the last 168 hours. Coagulation Profile: No results  for input(s): INR, PROTIME in the last 168 hours. Cardiac Enzymes: No results for input(s): CKTOTAL, CKMB, CKMBINDEX, TROPONINI in the last 168 hours. BNP (last 3 results) No results for input(s): PROBNP in the last 8760 hours. HbA1C: No results for input(s): HGBA1C in the last 72 hours. CBG: No results for input(s): GLUCAP in the last 168 hours. Lipid Profile: No results for input(s): CHOL, HDL, LDLCALC, TRIG, CHOLHDL, LDLDIRECT in the last 72 hours. Thyroid Function Tests: No results for input(s): TSH, T4TOTAL, FREET4, T3FREE,  THYROIDAB in the last 72 hours. Anemia Panel: No results for input(s): VITAMINB12, FOLATE, FERRITIN, TIBC, IRON, RETICCTPCT in the last 72 hours. Urine analysis: No results found for: COLORURINE, APPEARANCEUR, LABSPEC, PHURINE, GLUCOSEU, HGBUR, BILIRUBINUR, KETONESUR, PROTEINUR, UROBILINOGEN, NITRITE, LEUKOCYTESUR Sepsis Labs: @LABRCNTIP (procalcitonin:4,lacticidven:4)  ) Recent Results (from the past 240 hour(s))  SARS Coronavirus 2 (CEPHEID - Performed in [facility] hospital lab), Hosp Order     Status: None   Collection Time: 11/03/18 10:55 AM   Specimen: Nasopharyngeal Swab  Result Value Ref Range Status   SARS Coronavirus 2 NEGATIVE NEGATIVE Final    Comment: (NOTE) If result is NEGATIVE SARS-CoV-2 target nucleic acids are NOT DETECTED. The SARS-CoV-2 RNA is generally detectable in upper and lower  respiratory specimens during the acute phase of infection. The lowest  concentration of SARS-CoV-2 viral copies this assay can detect is 250  copies / mL. A negative result does not preclude SARS-CoV-2 infection  and should not be used as the sole basis for treatment or other  patient management decisions.  A negative result may occur with  improper specimen collection / handling, submission of specimen other  than nasopharyngeal swab, presence of viral mutation(s) within the  areas targeted by this assay, and inadequate number of viral copies  (<250 copies / mL). A negative result must be combined with clinical  observations, patient history, and epidemiological information. If result is POSITIVE SARS-CoV-2 target nucleic acids are DETECTED. The SARS-CoV-2 RNA is generally detectable in upper and lower  respiratory specimens dur ing the acute phase of infection.  Positive  results are indicative of active infection with SARS-CoV-2.  Clinical  correlation with patient history and other diagnostic information is  necessary to determine patient infection status.  Positive results do   not rule out bacterial infection or co-infection with other viruses. If result is PRESUMPTIVE POSTIVE SARS-CoV-2 nucleic acids MAY BE PRESENT.   A presumptive positive result was obtained on the submitted specimen  and confirmed on repeat testing.  While 2019 novel coronavirus  (SARS-CoV-2) nucleic acids may be present in the submitted sample  additional confirmatory testing may be necessary for epidemiological  and / or clinical management purposes  to differentiate between  SARS-CoV-2 and other Sarbecovirus currently known to infect humans.  If clinically indicated additional testing with an alternate test  methodology [phone]) is advised. The SARS-CoV-2 RNA is generally  detectable in upper and lower respiratory sp ecimens during the acute  phase of infection. The expected result is Negative. Fact Sheet for Patients:  BoilerBrush.com.cy Fact Sheet for Healthcare Providers: https://pope.com/ This test is not yet approved or cleared by the [place] FDA and has been authorized for detection and/or diagnosis of SARS-CoV-2 by FDA under an Emergency Use Authorization (EUA).  This EUA will remain in effect (meaning this test can be used) for the duration of the COVID-19 declaration under Section 564(b)(1) of the Act, 21 U.S.C. section 360bbb-3(b)(1), unless the authorization is terminated or revoked sooner. Performed  at [facility], [address] [place], [place] [address]          Radiology Studies: [place] Abdomen Complete  Result Date: 11/04/2018 CLINICAL DATA:  Initial evaluation for acute abdominal pain for 2 days. EXAM: ABDOMEN ULTRASOUND COMPLETE COMPARISON:  None available. FINDINGS: Gallbladder: Multiple small stones seen within the gallbladder lumen, largest of which measures 5.5 mm. Gallbladder wall minimally thickened to 4 mm, which could be related incomplete distension. No free pericholecystic fluid. No  sonographic Murphy sign elicited on exam. Common bile duct: Diameter: 4.8 mm Liver: No focal lesion identified. Mildly increased echogenicity within the hepatic parenchyma, suggesting steatosis. Portal vein is patent on color Doppler imaging with normal direction of blood flow towards the liver. IVC: No abnormality visualized. Pancreas: Not visualized. Spleen: Size and appearance within normal limits. Right Kidney: Length: 12.1 cm. Echogenicity within normal limits. No hydronephrosis. Few scattered simple cyst present, largest of which measures 4.8 x 4.7 x 4.4 cm at the upper pole. Left Kidney: Length: 13.7 mm. Echogenicity within normal limits. No mass or hydronephrosis visualized. Abdominal aorta: No aneurysm visualized. Other findings: None. IMPRESSION: 1. Cholelithiasis with minimal gallbladder wall thickening. No other sonographic features for acute cholecystitis. No biliary dilatation. 2. Mildly increased echogenicity within the patent parenchyma, suggesting steatosis. 3. Scattered simple right renal cysts measuring up to 4.8 cm. 4. Otherwise negative abdominal ultrasound. No other acute abnormality identified. Electronically Signed   By: Rise MuBenjamin  McClintock M.D.   On: 11/04/2018 05:06   Dg Chest Port 1 View  Result Date: 11/03/2018 CLINICAL DATA:  Chest pain and shortness of breath. EXAM: PORTABLE CHEST 1 VIEW COMPARISON:  Chest x-ray dated 04/15/2016. FINDINGS: LEFT chest wall pacemaker/ICD apparatus appears stable in position. Median sternotomy wires appear intact. Lungs are clear. No pleural effusion or pneumothorax seen. No acute appearing osseous abnormality. IMPRESSION: No active disease. No evidence of pneumonia or pulmonary edema. Electronically Signed   By: Bary RichardStan  Maynard M.D.   On: 11/03/2018 10:41        Scheduled Meds: . aspirin EC  81 mg Oral q morning - 10a  . atorvastatin  80 mg Oral Daily  . cholecalciferol  1,000 Units Oral QHS  . clopidogrel  75 mg Oral Daily  . enoxaparin  (LOVENOX) injection  40 mg Subcutaneous Q24H  . ezetimibe  10 mg Oral Daily  . furosemide  40 mg Oral Daily  . irbesartan  37.5 mg Oral Daily  . metoprolol succinate  25 mg Oral QPM  . pantoprazole  20 mg Oral Daily  . tamsulosin  0.4 mg Oral q morning - 10a   Continuous Infusions:   LOS: 0 days    Time spent: 25 minutes    Berton MountSylvester , MD  [facility] Pager #: [phone] 7PM-7AM contact night coverage as above

## 2018-11-04 NOTE — Progress Notes (Signed)
Patient complaining of 9/10 mid epigastic pain similar to pain he had last night.  Patient received x1 dose of Bentyl last night which seemed to be most effective with this pain per patient.  RN text paged [facility].

## 2018-11-04 NOTE — Consult Note (Addendum)
Referring Provider: Dr. Dana Allan Primary Care Physician:  Lucia Gaskins, MD Primary Gastroenterologist:  Dr. Manus Rudd  Reason for Consultation:  Epigastric pain  HPI: Stephen Macdonald is a 73 y.o. 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 He was awakened around 5am 11/03/2018 with 9 out of 10 substernal chest pain and epigastric associated with nausea and dry heaves. No specific food triggers. No fever, sweats or chills. No SOB. He was concerned he might be having another heart attack so he called 911. He is on ASA 58m once daily, denies other NSAID use. He is on Plavix 761monce daily, last taken this morning. He denies having any dysphagia, heartburn, upper or lower abdominal pain. He passed 3 soft brown BMs yesterday. Denies having any rectal bleeding or melena. No history of GIB or ulcers. [facility] [place]. He reports 2 benign polyps were removed, advised to have a repeat colonoscopy in 3 years. Sister died at the age of 4526rom pancreatic cancer.    ED course: Sodium 138.  Potassium 4.3.  Glucose 151.  BUN 13.  Creatinine 1.30.  Anion gap 10.  Alk phos 57.  Albumin 3.4.  Lipase 24.  AST 19.  ALT 28.  Total bili 1.0.  Troponin 9 and 10.  WBC 8.9.  Hemoglobin 14.7.  Hematocrit 41.9.  Platelet 194.  Chest X-ray: No active disease.  No evidence of pneumonia or or pulmonary edema.  Abdominal ultrasound: 1. Cholelithiasis with minimal gallbladder wall thickening. No other sonographic features for acute cholecystitis. No biliary dilatation. 2. Mildly increased echogenicity within the patent parenchyma, suggesting steatosis. 3. Scattered simple right renal cysts measuring up to 4.8 cm. 4. Otherwise negative abdominal ultrasound. No other acute abnormality identified.  He was evaluated by cardiologist Dr. KeLyman Bishopchest pain assessed not to be a cardiac etiology.  Further GI evaluation recommended.  Echo 03/2016 LVEF 20%.   Past Medical History:  Diagnosis Date   Chronic combined systolic and diastolic CHF, NYHA class 3 ([place]   a. 01/2015 Echo: EF 35-30%; b. 03/2016 Echo: Ef 20%, Gr1DD, mid-apical anterior, mid anteroseptal, apical inferior, basal inferolateral, mid anterolateral, and apical AK, basal anteroseptal and mid inferior severe HK, midlly dil LA, mild TR.   CKD (chronic kidney disease), stage III ([place]/2017   Coronary artery disease    a. 1997 s/p CABG;  b. 01/2015 MI/PCI: G->PDA;  c. 03/2016 NSTEMI/PCI: LM 20, LAD 10069m2 80, RI 60, LCX ok, OM1 90, RCA 60p, 100m25mAV 80, G->RPDA 15p ISR, 90d (3.5x28 Promus Premier DES), LIMA->OM1->dLAD nl.   High cholesterol    Hypertension    Ischemic cardiomyopathy    a. 01/2015 Echo: EF 25-30%; b. s/p AICD;  c. 03/2016 Echo: Ef 20%, Gr1DD.   Smoker     Past Surgical History:  Procedure Laterality Date   CARDIAC CATHETERIZATION N/A 02/12/2015   Procedure: Left Heart Cath and Coronary Angiography;  Surgeon: JayaJettie Booze;  [facility];  Service: Cardiovascular;  Laterality: N/A;   CARDIAC CATHETERIZATION N/A 02/12/2015   Procedure: Coronary Stent Intervention;  Surgeon: JayaJettie Booze;  [facility];  Service: Cardiovascular;  Laterality: N/A;   CARDIAC CATHETERIZATION  02/12/2015   Procedure: Left Heart Cath and Cors/Grafts Angiography;  Surgeon: JayaJettie Booze;  Location: [place] [facility];  Service: Cardiovascular;;   CARDIAC CATHETERIZATION N/A 04/16/2016   Procedure: LEFT HEART CATH AND CORS/GRAFTS ANGIOGRAPHY;  Surgeon: Nelva Bush, MD;  Location: [place] [facility];  Service: Cardiovascular;  Laterality: N/A;   CARDIAC CATHETERIZATION N/A 04/16/2016   Procedure: Coronary Stent Intervention;  Surgeon: Nelva Bush, MD;  Location: [place] [facility];   Service: Cardiovascular;  Laterality: N/A;   CARDIAC DEFIBRILLATOR PLACEMENT     CORONARY ARTERY BYPASS GRAFT     x 3   CORONARY STENT PLACEMENT     PACEMAKER INSERTION      Prior to Admission medications   Medication Sig Start Date End Date Taking? Authorizing Provider  aspirin EC 81 MG tablet Take 81 mg by mouth every morning.    Yes [provider]  atorvastatin (LIPITOR) 80 MG tablet Take 1 tablet (80 mg total) by mouth daily. Patient taking differently: Take 80 mg by mouth at bedtime.  02/16/15  Yes Almyra Deforest, PA  cholecalciferol (VITAMIN D) 1000 UNITS tablet Take 1,000 Units by mouth at bedtime.    Yes [provider]  clopidogrel (PLAVIX) 75 MG tablet Take 75 mg by mouth daily. 08/01/18  Yes [provider]  ezetimibe (ZETIA) 10 MG tablet Take 10 mg by mouth daily. 08/01/18  Yes [provider]  furosemide (LASIX) 40 MG tablet Take 1 tablet (40 mg total) by mouth daily. Patient taking differently: Take 20 mg by mouth daily.  04/18/16  Yes Theora Gianotti, NP  metoprolol succinate (TOPROL-XL) 25 MG 24 hr tablet Take 25 mg by mouth every evening.    Yes [provider]  nitroGLYCERIN (NITROSTAT) 0.4 MG SL tablet Place 1 tablet (0.4 mg total) under the tongue every 5 (five) minutes as needed for chest pain. 02/16/15  Yes Almyra Deforest, PA  tamsulosin (FLOMAX) 0.4 MG CAPS capsule Take 0.4 mg by mouth every morning.   Yes [provider]  valsartan (DIOVAN) 40 MG tablet Take 40 mg by mouth at bedtime.    Yes [provider]  potassium chloride 20 MEQ TBCR Take 20 mEq by mouth daily. Patient not taking: Reported on 11/03/2018 04/18/16   Theora Gianotti, NP  ticagrelor (BRILINTA) 90 MG TABS tablet Take 1 tablet (90 mg total) by mouth 2 (two) times daily. Patient not taking: Reported on 11/03/2018 04/18/16   Theora Gianotti, NP    Current Facility-Administered Medications  Medication Dose Route Frequency  Provider Last Rate Last Dose   acetaminophen (TYLENOL) tablet 650 mg  650 mg Oral Q6H PRN Merton Border, MD       Or   acetaminophen (TYLENOL) suppository 650 mg  650 mg Rectal Q6H PRN Merton Border, MD       alum & mag hydroxide-simeth (MAALOX/MYLANTA) 200-200-20 MG/5ML suspension 30 mL  30 mL Oral Q6H PRN Merton Border, MD   30 mL at 11/04/18 0857   aspirin EC tablet 81 mg  81 mg Oral q morning - 10a Merton Border, MD   81 mg at 11/04/18 0846   atorvastatin (LIPITOR) tablet 80 mg  80 mg Oral Daily Merton Border, MD   80 mg at 11/04/18 0844   cholecalciferol (VITAMIN D3) tablet 1,000 Units  1,000 Units Oral QHS Merton Border, MD   1,000 Units at 11/03/18 2120   clopidogrel (PLAVIX) tablet 75 mg  75 mg Oral Daily Merton Border, MD   75 mg at 11/04/18 0845   enoxaparin (LOVENOX) injection 40 mg  40 mg Subcutaneous Q24H Merton Border, MD   40 mg at 11/03/18 1718   ezetimibe (ZETIA) tablet 10 mg  10 mg Oral Daily Merton Border, MD   10 mg at 11/04/18 0846   furosemide (LASIX) tablet 40 mg  40 mg Oral Daily Merton Border, MD   40 mg at 11/04/18 0846   HYDROcodone-acetaminophen (NORCO/VICODIN) 5-325 MG per tablet 1-2 tablet  1-2 tablet Oral Q4H PRN Merton Border, MD   2 tablet at 11/03/18 1513   irbesartan (AVAPRO) tablet 37.5 mg  37.5 mg Oral Daily Merton Border, MD   37.5 mg at 11/04/18 0845   metoprolol succinate (TOPROL-XL) 24 hr tablet 25 mg  25 mg Oral QPM Merton Border, MD   25 mg at 11/03/18 1719   nitroGLYCERIN (NITROSTAT) SL tablet 0.4 mg  0.4 mg Sublingual Q5 min PRN Merton Border, MD       ondansetron (ZOFRAN) tablet 4 mg  4 mg Oral Q6H PRN Merton Border, MD       Or   ondansetron (ZOFRAN) injection 4 mg  4 mg Intravenous Q6H PRN Merton Border, MD   4 mg at 11/03/18 1514   tamsulosin (FLOMAX) capsule 0.4 mg  0.4 mg Oral q morning - 10a Merton Border, MD   0.4 mg at 11/04/18 0845    Allergies as of 11/03/2018 - Review Complete 11/03/2018  Allergen Reaction Noted   Lisinopril Cough 07/21/2016    Codeine Other (See Comments) 06/22/2015    History reviewed. No pertinent family history.  Social History   Socioeconomic History   Marital status: Married    Spouse name: monica Trieu   Number of children: 2   Years of education: Not on file   Highest education level: Not on file  Occupational History   Not on file  Social Needs   Financial resource strain: Not hard at all   Food insecurity    Worry: Never true    Inability: Never true   Transportation needs    Medical: No    Non-medical: No  Tobacco Use   Smoking status: Current Some Day Smoker    Types: Cigars, Cigarettes   Smokeless tobacco: Former Systems developer    Types: Snuff   Tobacco comment: Discussed cessation and shared we have classes that he could attend.   Substance and Sexual Activity   Alcohol use: Yes    Alcohol/week: 3.0 standard drinks    Types: 2 Cans of beer, 1 Shots of liquor per week   Drug use: No   Sexual activity: Yes  Lifestyle   Physical activity    Days per week: 4 days    Minutes per session: 20 min   Stress: Not at all  Relationships   Social connections    Talks on phone: More than three times a week    Gets together: Not on file    Attends religious service: Not on file    Active member of club or organization: Not on file    Attends meetings of clubs or organizations: Not on file    Relationship status: Not on file   Intimate partner violence    Fear of current or ex partner: Not on file    Emotionally abused: Not on file    Physically abused: Not on file    Forced sexual activity: Not on file  Other Topics Concern   Not on file  Social History Narrative   Not on file    Review of Systems: See HPI,  all other systems reviewed and are negative   Physical Exam: Vital signs in last 24 hours: Temp:  [98.2 F (36.8 C)-99.2 F (37.3 C)] 99.2 F (37.3 C) (07/19 0121) Pulse Rate:  [59-76] 61 (07/19 0121) Resp:  [16-20] 20 (07/19 0900) BP: (98-121)/(51-79)  114/63 (07/19 0121) SpO2:  [91 %-99 %] 94 % (07/19 0130) Weight:  [107 kg-108.2 kg] 108.2 kg (07/19 0621) Last BM Date: 11/04/18("very small") General:   Alert,  well-developed, well-nourished, pleasant and cooperative in NAD. Head:  Normocephalic and atraumatic. Eyes:  Sclera clear, no icterus. Conjunctiva pink. Ears:  Slightly HOH. Nose:  No deformity, discharge or lesions. Mouth:  No deformity or lesions.   Neck:  Supple. Lungs:  Clear throughout. Heart:  RRR, no murmurs. Abdomen:  Soft, slight tenderness below the xyphoid, no rebound or guarding, + BS x 4 quads. Rectal:  Deferred  Msk:  Symmetrical without gross deformities. . Pulses:  Normal pulses noted. Extremities:  Without clubbing or edema. Neurologic:  Alert and  oriented x4;  grossly normal neurologically. Skin:  Intact without significant lesions or rashes.. Psych:  Alert and cooperative. Normal mood and affect.  Intake/Output from previous day: 07/18 0701 - 07/19 0700 In: 770 [P.O.:270; IV Piggyback:500] Out: 825 [Urine:825] Intake/Output this shift: No intake/output data recorded.  Lab Results: Recent Labs    11/03/18 1044  WBC 8.9  HGB 14.7  HCT 41.9  PLT 194   BMET Recent Labs    11/03/18 1044 11/04/18 0755  NA 138 136  K 4.3 4.0  CL 108 105  CO2 20* 24  GLUCOSE 151* 122*  BUN 13 13  CREATININE 1.30* 1.19  CALCIUM 8.8* 8.7*   LFT Recent Labs    11/03/18 1227  PROT 5.7*  ALBUMIN 3.4*  AST 19  ALT 28  ALKPHOS 57  BILITOT 1.0  BILIDIR 0.2  IBILI 0.8    Studies/Results: US Abdomen Complete  Result Date: 11/04/2018 CLINICAL DATA:  Initial evaluation for acute abdominal pain for 2 days. EXAM: ABDOMEN ULTRASOUND COMPLETE COMPARISON:  None available. FINDINGS: Gallbladder: Multiple small stones seen within the gallbladder lumen, largest of which measures 5.5 mm. Gallbladder wall minimally thickened to 4 mm, which could be related incomplete distension. No free pericholecystic fluid. No  sonographic Murphy sign elicited on exam. Common bile duct: Diameter: 4.8 mm Liver: No focal lesion identified. Mildly increased echogenicity within the hepatic parenchyma, suggesting steatosis. Portal vein is patent on color Doppler imaging with normal direction of blood flow towards the liver. IVC: No abnormality visualized. Pancreas: Not visualized. Spleen: Size and appearance within normal limits. Right Kidney: Length: 12.1 cm. Echogenicity within normal limits. No hydronephrosis. Few scattered simple cyst present, largest of which measures 4.8 x 4.7 x 4.4 cm at the upper pole. Left Kidney: Length: 13.7 mm. Echogenicity within normal limits. No mass or hydronephrosis visualized. Abdominal aorta: No aneurysm visualized. Other findings: None. IMPRESSION: 1. Cholelithiasis with minimal gallbladder wall thickening. No other sonographic features for acute cholecystitis. No biliary dilatation. 2. Mildly increased echogenicity within the patent parenchyma, suggesting steatosis. 3. Scattered simple right renal cysts measuring up to 4.8 cm. 4. Otherwise negative abdominal ultrasound. No other acute abnormality identified. Electronically Signed   By: Jeannine Boga M.D.   On: 11/04/2018 05:06   Dg Chest Port 1 View  Result Date: 11/03/2018 CLINICAL DATA:  Chest pain and shortness of breath. EXAM: PORTABLE CHEST 1 VIEW COMPARISON:  Chest x-ray dated 04/15/2016. FINDINGS: LEFT chest wall pacemaker/ICD apparatus appears stable in  position. Median sternotomy wires appear intact. Lungs are clear. No pleural effusion or pneumothorax seen. No acute appearing osseous abnormality. IMPRESSION: No active disease. No evidence of pneumonia or pulmonary edema. Electronically Signed   By: Franki Cabot M.D.   On: 11/03/2018 10:41    IMPRESSION/PLAN:  1. 73 y.o. male with substernal CP, epigastric pain which awakened him from sleep. Normal LFTs and Lipase. Abdominal sonogram shows cholelithiasis with minimal gallbladder  wall thickening, no biliary dilatation, no evidence of acute cholecystitis. Cardiology did not assess CP to be cardiac etiology. Most likely passed a small gallstone. Since he is on Plavix/ASA, consider EGD to rule out PUD. He is not anemic.  -repeat CBC, CMP and lipase in am -possible EGD tomorrow, further instructions per Dr. Carlean Purl -hold Plavix for now -will need further abdominal imaging CTAP if abdominal pain worsens -Protonix 18m IV bid -NPO for now -FOBT  2. Significant History of CAD, MI x 2, IM, s/p CABG, HTN. -cardiology following   3. History of colon polyps, [facility] [place] 4. Family hx of pancreatic cancer  Further recommendations per Dr. GCarmin MuskratMDorathy Daft 11/04/2018, 11:42 AM     [facility] Attending   I have taken an interval history, reviewed the chart and examined the patient. I agree with the Advanced Practitioner's note, impression and recommendations.    I think it is likely he has symptomatic cholelithiasis vs that + passed CBD stone  EGD usu not revealing in a sudden onset pain scenario like this.  I do think a CT abd/pelvis is worth checking and ordered.  He should be on a chronic PPi to reduce risk of ulcer disease and bleeding since on chronic clpidogrel/asa and I started 20 mg pantoprazole and he should go home on that  He could go home today if CT negative  [facility] Thanks  CGatha Mayer MD, [facility] 11/04/2018 1:34 PM Pager [phone]

## 2018-11-04 NOTE — Progress Notes (Signed)
Patient complaining of 9/10 epigastric pain that radiates like a band around patient's sides to back.  Patient stated Maalox given at 2308 helped because he was able to go to sleep but then was awakened by the pain.  RN text paged [facility].

## 2018-11-05 ENCOUNTER — Inpatient Hospital Stay (HOSPITAL_COMMUNITY): Payer: Medicare Other

## 2018-11-05 DIAGNOSIS — I5022 Chronic systolic (congestive) heart failure: Secondary | ICD-10-CM

## 2018-11-05 DIAGNOSIS — K8000 Calculus of gallbladder with acute cholecystitis without obstruction: Secondary | ICD-10-CM

## 2018-11-05 DIAGNOSIS — I10 Essential (primary) hypertension: Secondary | ICD-10-CM

## 2018-11-05 DIAGNOSIS — N183 Chronic kidney disease, stage 3 (moderate): Secondary | ICD-10-CM

## 2018-11-05 DIAGNOSIS — N400 Enlarged prostate without lower urinary tract symptoms: Secondary | ICD-10-CM

## 2018-11-05 MED ORDER — SODIUM CHLORIDE 0.9 % IV SOLN
INTRAVENOUS | Status: DC | PRN
  Administered 2018-11-05: 250 mL via INTRAVENOUS

## 2018-11-05 MED ORDER — TECHNETIUM TC 99M MEBROFENIN IV KIT
5.1000 | PACK | Freq: Once | INTRAVENOUS | Status: AC | PRN
  Administered 2018-11-05: 5.1 via INTRAVENOUS

## 2018-11-05 MED ORDER — MORPHINE SULFATE (PF) 4 MG/ML IV SOLN
INTRAVENOUS | Status: AC
  Administered 2018-11-05: 3 mg
  Filled 2018-11-05: qty 1

## 2018-11-05 MED ORDER — PIPERACILLIN-TAZOBACTAM 3.375 G IVPB
3.3750 g | Freq: Three times a day (TID) | INTRAVENOUS | Status: DC
  Administered 2018-11-05 – 2018-11-08 (×8): 3.375 g via INTRAVENOUS
  Filled 2018-11-05 (×9): qty 50

## 2018-11-05 MED ORDER — MORPHINE SULFATE (PF) 4 MG/ML IV SOLN: 3.0000 mg | Freq: Once | INTRAVENOUS | Status: AC

## 2018-11-05 NOTE — Progress Notes (Signed)
Patient's temperature this morning orally was 101.1 Farenheit.  RN administered Tylenol 650mg  PO per PRN order at 0542.  [facility] paged with this information.

## 2018-11-05 NOTE — Progress Notes (Signed)
HIDA scan positive.  General surgery, Dr. Kae Heller consulted and will see patient.

## 2018-11-05 NOTE — Progress Notes (Signed)
Temperature rechecked and is 99.4 degrees Farenheit.

## 2018-11-05 NOTE — Progress Notes (Signed)
PROGRESS NOTE  Stephen PontoJerry P Macdonald ZOX:[id number] DOB: Nov 13, 1945   PCP: Oval Linseyondiego, Richard, MD  Patient is from: Home  DOA: 11/03/2018 LOS: 1  Brief Narrative / Interim history: 73 year old male with history of CAD, ICM with EF of 20%, HTN, HLD, tobacco use and CKD 3 presenting with lower sternal and epigastric pain.  Troponin has been flat and within normal range.  Seen by cardiology team and pain thought to be noncardiac.  Echo with EF of 20 to 25%.  GI on board.  Liver enzymes and lipase within normal range.  RUQ ultrasound and CT abdomen and pelvis with cholelithiasis and gallbladder wall thickening.   Subjective: No major events overnight of this morning.  Feels well this morning.  Denies abdominal pain, nausea, vomiting, chest pain or dyspnea.  Objective: Vitals:   11/04/18 1748 11/04/18 2114 11/05/18 0512 11/05/18 0642  BP: 119/64 115/64 109/65   Pulse: 68 65 69   Resp:  12 (!) 26   Temp:  98.6 F (37 C) (!) 101.1 F (38.4 C) 99.4 F (37.4 C)  TempSrc:  Oral Oral Oral  SpO2:  91% 93%   Weight:   106.6 kg   Height:        Intake/Output Summary (Last 24 hours) at 11/05/2018 1511 Last data filed at 11/05/2018 1432 Gross per 24 hour  Intake 1120 ml  Output 1500 ml  Net -380 ml   Filed Weights   11/03/18 1605 11/04/18 0621 11/05/18 0512  Weight: 107 kg 108.2 kg 106.6 kg    Examination:  GENERAL: No acute distress.  Appears well.  Sitting on bedside chair. HEENT: MMM.  Vision and hearing grossly intact.  NECK: Supple.  No apparent JVD.  RESP:  No IWOB. Good air movement bilaterally. CVS:  RRR. Heart sounds normal.  ABD/GI/GU: Bowel sounds present. Soft. Non tender.  MSK/EXT:  Moves extremities. No apparent deformity or edema.  SKIN: no apparent skin lesion or wound NEURO: Awake, alert and oriented appropriately.  No gross deficit.  PSYCH: Calm. Normal affect.   I have personally reviewed the following labs and images:  Radiology Studies: Ct Abdomen Pelvis W  Contrast  Result Date: 11/04/2018 CLINICAL DATA:  Epigastric pain and tenderness. Stage 3 kidney disease. EXAM: CT ABDOMEN AND PELVIS WITH CONTRAST TECHNIQUE: Multidetector CT imaging of the abdomen and pelvis was performed using the standard protocol following bolus administration of intravenous contrast. CONTRAST:  100mL OMNIPAQUE IOHEXOL 300 MG/ML  SOLN COMPARISON:  Right upper quadrant ultrasound today. FINDINGS: Lower chest: Atelectasis right base. Cardiac pacer leads are present. Hepatobiliary: Liver and biliary tree are within normal. Mild to moderate cholelithiasis is present. Subtle stranding of the fat adjacent the gallbladder. No significant wall thickening. Pancreas: Normal. Spleen: Multiple calcified granulomas. Adrenals/Urinary Tract: Adrenal glands are normal. Kidneys are normal in size without hydronephrosis or nephrolithiasis. Right renal cyst measuring 5.6 cm. 1 cm right renal hypodensity too small to characterize but likely a cyst. Ureters and bladder are normal. Stomach/Bowel: Stomach and small bowel are normal. Appendix is not visualized. Mild diverticulosis of the colon. Vascular/Lymphatic: Mild calcified plaque over the abdominal aorta. No adenopathy. Reproductive: Normal. Other: No free fluid. Musculoskeletal: Mild degenerative change of the spine and hips. IMPRESSION: Mild to moderate cholelithiasis. Subtle stranding of the pericholecystic fat which could be seen with mild acute cholecystitis. Note that the right upper quadrant ultrasound earlier today was also somewhat equivocal for acute cholecystitis. Consider HIDA scan for further evaluation. 5.6 cm right renal cyst and 1 cm  right renal cortical hypodensity too small to characterize but likely a cyst. Recommend follow-up CT 6 months. Colonic diverticulosis. Aortic Atherosclerosis (ICD10-I70.0). Electronically Signed   By: Elberta Fortis M.D.   On: 11/04/2018 18:50    Microbiology: Recent Results (from the past 240 hour(s))  SARS  Coronavirus 2 (CEPHEID - Performed in [facility] hospital lab), Hosp Order     Status: None   Collection Time: 11/03/18 10:55 AM   Specimen: Nasopharyngeal Swab  Result Value Ref Range Status   SARS Coronavirus 2 NEGATIVE NEGATIVE Final    Comment: (NOTE) If result is NEGATIVE SARS-CoV-2 target nucleic acids are NOT DETECTED. The SARS-CoV-2 RNA is generally detectable in upper and lower  respiratory specimens during the acute phase of infection. The lowest  concentration of SARS-CoV-2 viral copies this assay can detect is 250  copies / mL. A negative result does not preclude SARS-CoV-2 infection  and should not be used as the sole basis for treatment or other  patient management decisions.  A negative result may occur with  improper specimen collection / handling, submission of specimen other  than nasopharyngeal swab, presence of viral mutation(s) within the  areas targeted by this assay, and inadequate number of viral copies  (<250 copies / mL). A negative result must be combined with clinical  observations, patient history, and epidemiological information. If result is POSITIVE SARS-CoV-2 target nucleic acids are DETECTED. The SARS-CoV-2 RNA is generally detectable in upper and lower  respiratory specimens dur ing the acute phase of infection.  Positive  results are indicative of active infection with SARS-CoV-2.  Clinical  correlation with patient history and other diagnostic information is  necessary to determine patient infection status.  Positive results do  not rule out bacterial infection or co-infection with other viruses. If result is PRESUMPTIVE POSTIVE SARS-CoV-2 nucleic acids MAY BE PRESENT.   A presumptive positive result was obtained on the submitted specimen  and confirmed on repeat testing.  While 2019 novel coronavirus  (SARS-CoV-2) nucleic acids may be present in the submitted sample  additional confirmatory testing may be necessary for epidemiological  and / or  clinical management purposes  to differentiate between  SARS-CoV-2 and other Sarbecovirus currently known to infect humans.  If clinically indicated additional testing with an alternate test  methodology [phone]) is advised. The SARS-CoV-2 RNA is generally  detectable in upper and lower respiratory sp ecimens during the acute  phase of infection. The expected result is Negative. Fact Sheet for Patients:  BoilerBrush.com.cy Fact Sheet for Healthcare Providers: https://pope.com/ This test is not yet approved or cleared by the [place] FDA and has been authorized for detection and/or diagnosis of SARS-CoV-2 by FDA under an Emergency Use Authorization (EUA).  This EUA will remain in effect (meaning this test can be used) for the duration of the COVID-19 declaration under Section 564(b)(1) of the Act, 21 U.S.C. section 360bbb-3(b)(1), unless the authorization is terminated or revoked sooner. Performed at [facility], [address], [place], [place] [address]     Sepsis Labs: Invalid input(s): PROCALCITONIN, LACTICIDVEN  Urine analysis: No results found for: COLORURINE, APPEARANCEUR, LABSPEC, PHURINE, GLUCOSEU, HGBUR, BILIRUBINUR, KETONESUR, PROTEINUR, UROBILINOGEN, NITRITE, LEUKOCYTESUR  Anemia Panel: No results for input(s): VITAMINB12, FOLATE, FERRITIN, TIBC, IRON, RETICCTPCT in the last 72 hours.  Thyroid Function Tests: No results for input(s): TSH, T4TOTAL, FREET4, T3FREE, THYROIDAB in the last 72 hours.  Lipid Profile: No results for input(s): CHOL, HDL, LDLCALC, TRIG, CHOLHDL, LDLDIRECT in the last 72  hours.  CBG: No results for input(s): GLUCAP in the last 168 hours.  HbA1C: No results for input(s): HGBA1C in the last 72 hours.  BNP (last 3 results): No results for input(s): PROBNP in the last 8760 hours.  Cardiac Enzymes: No results for input(s): CKTOTAL, CKMB, CKMBINDEX, TROPONINI in the last 168 hours.   Coagulation Profile: No results for input(s): INR, PROTIME in the last 168 hours.  Liver Function Tests: Recent Labs  Lab 11/03/18 1227  AST 19  ALT 28  ALKPHOS 57  BILITOT 1.0  PROT 5.7*  ALBUMIN 3.4*   Recent Labs  Lab 11/03/18 1227  LIPASE 24   No results for input(s): AMMONIA in the last 168 hours.  Basic Metabolic Panel: Recent Labs  Lab 11/03/18 1044 11/04/18 0755  NA 138 136  K 4.3 4.0  CL 108 105  CO2 20* 24  GLUCOSE 151* 122*  BUN 13 13  CREATININE 1.30* 1.19  CALCIUM 8.8* 8.7*   GFR: Estimated Creatinine Clearance: 69.8 mL/min (by C-G formula based on SCr of 1.19 mg/dL).  CBC: Recent Labs  Lab 11/03/18 1044  WBC 8.9  HGB 14.7  HCT 41.9  MCV 93.7  PLT 194    Procedures:  None  Microbiology summarized: COVID-19 negative.  Assessment & Plan: Atypical chest pain/epigastric pain -RUQ ultrasound and CT abdomen and pelvis revealed cholelithiasis with minimal gallbladder wall thickening. -HIDA scan today -Continue PPI -Appreciate cardiology input-unlikely cardiac origin. -Appreciate GI input-plan for EGD if HIDA nonrevealing. -N.p.o. 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  Appears euvolemic. -Continue home medications (metoprolol, Lasix, Plavix, atorvastatin, Zetia and aspirin) -Closely monitor cardiopulmonary status.  CKD-3: Stable -Continue monitoring  Hypertension: Normotensive. -Cardiac meds as above.  BPH: Stable -Continue home Flomax  DVT prophylaxis: Subcu Lovenox Code Status: Full code Family Communication: Patient and/or RN. Available if any question.  Disposition Plan: Remains inpatient for further evaluation of epigastric pain. Consultants: Cardiology, gastroenterology   Antimicrobials: Anti-infectives (From admission, onward)   None      Sch Meds:  Scheduled Meds: . aspirin EC  81 mg Oral q morning - 10a  . atorvastatin  80 mg Oral Daily  . cholecalciferol  1,000 Units Oral  QHS  . clopidogrel  75 mg Oral Daily  . enoxaparin (LOVENOX) injection  40 mg Subcutaneous Q24H  . ezetimibe  10 mg Oral Daily  . furosemide  40 mg Oral Daily  . irbesartan  37.5 mg Oral Daily  . metoprolol succinate  25 mg Oral QPM  .  morphine injection  3 mg Intravenous Once  . pantoprazole  20 mg Oral Daily  . tamsulosin  0.4 mg Oral q morning - 10a   Continuous Infusions: PRN Meds:.acetaminophen **OR** acetaminophen, alum & mag hydroxide-simeth, HYDROcodone-acetaminophen, nitroGLYCERIN, ondansetron **OR** ondansetron (ZOFRAN) IV   Stephen Macdonald  If 7PM-7AM, please contact night-coverage [email or web address] Password [email or web address] 11/05/2018, 3:11 PM

## 2018-11-05 NOTE — Progress Notes (Signed)
Pharmacy Antibiotic Note  Stephen Macdonald is a 73 y.o. male admitted on 11/03/2018 with lower sternal and epigastric pain. Cardiac origin of pain ruled out. RUQ ultrasound and CT of abdomen/pelvis with cholelithiasis and gallbladder wall thickening; HIDA scan positive; GI consulted. Pharmacy has been consulted for Zosyn dosing for  intra-abdominal infection/cholecystitis.  Medical history includes: systolic CHF/ICM  (EF 70-62%, stable), CKD-3 (stable), HTN, BPH  WBC 8.9K, Tmax 101.1 F earlier today (most recent temp 100 F); renal function stable (CrCl ~70 mL/min)  Plan: Zosyn 3.375 gm IV Q 8 hrs Monitor WBC, temp, clinical improvement  Height: 6' (182.9 cm) Weight: 235 lb 1.6 oz (106.6 kg) IBW/kg (Calculated) : 77.6  Temp (24hrs), Avg:99.8 F (37.7 C), Min:98.6 F (37 C), Max:101.1 F (38.4 C)  Recent Labs  Lab 11/03/18 1044 11/04/18 0755  WBC 8.9  --   CREATININE 1.30* 1.19    Estimated Creatinine Clearance: 69.8 mL/min (by C-G formula based on SCr of 1.19 mg/dL).    Allergies  Allergen Reactions  . Lisinopril Cough  . Codeine Other (See Comments)    Tingling skin. "makes my skin crawl"    Microbiology results: 7/18 COVID: negative  Thank you for allowing pharmacy to be a part of this patient's care.  Gillermina Hu, PharmD, BCPS, [facility] Clinical Pharmacist 11/05/2018 6:39 PM

## 2018-11-05 NOTE — Consult Note (Signed)
Surgical Consultation Requesting provider: Dr. Alanda Slim  CC: epigastric pain  HPI: This is a very pleasant 73 year old gentleman with multiple severe comorbidities including coronary artery disease, ischemic cardiomyopathy with ejection fraction of around 20-25%, hypertension, hyperlipidemia, tobacco abuse and stage III chronic kidney disease who was admitted 2 days ago with pain at the lower aspect of the sternum which radiated around both lower ribs into the back.  This was associated with nausea and dry heaves.  He states that it felt like his previous heart attack, but did not have any improvement with nitro..  This lasted about 2 days and he has been evaluated here in the hospital by cardiology and the etiology of pain is thought to be noncardiac.  Gastroenterology is also following.  Aside from chronically mildly elevated creatinine, his lab work was unremarkable.  He had a right upper quadrant ultrasound which confirms cholelithiasis, borderline gallbladder wall thickening.  Subsequent CT scan yesterday confirms cholelithiasis and notes some stranding in the pericholecystic fat and incidentally noted right renal cysts.  HIDA scan this evening demonstrates nonvisualization of the gallbladder consistent with cholecystitis.  He reports that he has not had any pain today.  On further questioning, he does endorse fleeting similar episodes in the past which are usually resolved with emesis.  Prior abdominal surgery includes open appendectomy.  Other notable procedures include multiple cardiac caths, CABG and pacemaker/ICD insertion.  Of note he is on Plavix.   Allergies  Allergen Reactions  . Lisinopril Cough  . Codeine Other (See Comments)    Tingling skin. "makes my skin crawl"    Past Medical History:  Diagnosis Date  . Chronic combined systolic and diastolic CHF, NYHA class 3 (HCC)    a. 01/2015 Echo: EF 35-30%; b. 03/2016 Echo: Ef 20%, Gr1DD, mid-apical anterior, mid anteroseptal, apical  inferior, basal inferolateral, mid anterolateral, and apical AK, basal anteroseptal and mid inferior severe HK, midlly dil LA, mild TR.  . CKD (chronic kidney disease), stage III (HCC) 04/15/2016  . Coronary artery disease    a. 1997 s/p CABG;  b. 01/2015 MI/PCI: G->PDA;  c. 03/2016 NSTEMI/PCI: LM 20, LAD 180m, D2 80, RI 60, LCX ok, OM1 90, RCA 60p, 170m, RPAV 80, G->RPDA 15p ISR, 90d (3.5x28 Promus Premier DES), LIMA->OM1->dLAD nl.  . High cholesterol   . Hypertension   . Ischemic cardiomyopathy    a. 01/2015 Echo: EF 25-30%; b. s/p AICD;  c. 03/2016 Echo: Ef 20%, Gr1DD.  Marland Kitchen Smoker     Past Surgical History:  Procedure Laterality Date  . CARDIAC CATHETERIZATION N/A 02/12/2015   Procedure: Left Heart Cath and Coronary Angiography;  Surgeon: Corky Crafts, MD;  Location: [facility] INVASIVE CV LAB;  Service: Cardiovascular;  Laterality: N/A;  . CARDIAC CATHETERIZATION N/A 02/12/2015   Procedure: Coronary Stent Intervention;  Surgeon: Corky Crafts, MD;  Location: [facility] INVASIVE CV LAB;  Service: Cardiovascular;  Laterality: N/A;  . CARDIAC CATHETERIZATION  02/12/2015   Procedure: Left Heart Cath and Cors/Grafts Angiography;  Surgeon: Corky Crafts, MD;  Location: [facility] INVASIVE CV LAB;  Service: Cardiovascular;;  . CARDIAC CATHETERIZATION N/A 04/16/2016   Procedure: LEFT HEART CATH AND CORS/GRAFTS ANGIOGRAPHY;  Surgeon: Yvonne Kendall, MD;  Location: [facility] INVASIVE CV LAB;  Service: Cardiovascular;  Laterality: N/A;  . CARDIAC CATHETERIZATION N/A 04/16/2016   Procedure: Coronary Stent Intervention;  Surgeon: Yvonne Kendall, MD;  Location: [facility] INVASIVE CV LAB;  Service: Cardiovascular;  Laterality: N/A;  . CARDIAC DEFIBRILLATOR PLACEMENT    . CORONARY ARTERY  BYPASS GRAFT     x 3  . CORONARY STENT PLACEMENT    . PACEMAKER INSERTION      History reviewed. No pertinent family history.  Social History   Socioeconomic History  . Marital status: Married    Spouse name: monica Mullings   . Number of children: 2  . Years of education: Not on file  . Highest education level: Not on file  Occupational History  . Not on file  Social Needs  . Financial resource strain: Not hard at all  . Food insecurity    Worry: Never true    Inability: Never true  . Transportation needs    Medical: No    Non-medical: No  Tobacco Use  . Smoking status: Current Some Day Smoker    Types: Cigars, Cigarettes  . Smokeless tobacco: Former NeurosurgeonUser    Types: Snuff  . Tobacco comment: Discussed cessation and shared we have classes that he could attend.   Substance and Sexual Activity  . Alcohol use: Yes    Alcohol/week: 3.0 standard drinks    Types: 2 Cans of beer, 1 Shots of liquor per week  . Drug use: No  . Sexual activity: Yes  Lifestyle  . Physical activity    Days per week: 4 days    Minutes per session: 20 min  . Stress: Not at all  Relationships  . Social connections    Talks on phone: More than three times a week    Gets together: Not on file    Attends religious service: Not on file    Active member of club or organization: Not on file    Attends meetings of clubs or organizations: Not on file    Relationship status: Not on file  Other Topics Concern  . Not on file  Social History Narrative  . Not on file    No current facility-administered medications on file prior to encounter.    Current Outpatient Medications on File Prior to Encounter  Medication Sig Dispense Refill  . aspirin EC 81 MG tablet Take 81 mg by mouth every morning.     Marland Kitchen. atorvastatin (LIPITOR) 80 MG tablet Take 1 tablet (80 mg total) by mouth daily. (Patient taking differently: Take 80 mg by mouth at bedtime. ) 30 tablet 11  . cholecalciferol (VITAMIN D) 1000 UNITS tablet Take 1,000 Units by mouth at bedtime.     . clopidogrel (PLAVIX) 75 MG tablet Take 75 mg by mouth daily.    Marland Kitchen. ezetimibe (ZETIA) 10 MG tablet Take 10 mg by mouth daily.    . furosemide (LASIX) 40 MG tablet Take 1 tablet (40 mg total)  by mouth daily. (Patient taking differently: Take 20 mg by mouth daily. ) 30 tablet 3  . metoprolol succinate (TOPROL-XL) 25 MG 24 hr tablet Take 25 mg by mouth every evening.     . nitroGLYCERIN (NITROSTAT) 0.4 MG SL tablet Place 1 tablet (0.4 mg total) under the tongue every 5 (five) minutes as needed for chest pain. 25 tablet 3  . tamsulosin (FLOMAX) 0.4 MG CAPS capsule Take 0.4 mg by mouth every morning.    . valsartan (DIOVAN) 40 MG tablet Take 40 mg by mouth at bedtime.     . potassium chloride 20 MEQ TBCR Take 20 mEq by mouth daily. (Patient not taking: Reported on 11/03/2018) 30 tablet 3  . ticagrelor (BRILINTA) 90 MG TABS tablet Take 1 tablet (90 mg total) by mouth 2 (two) times daily. (Patient not  taking: Reported on 11/03/2018) 60 tablet 6    Review of Systems: a complete, 10pt review of systems was completed with pertinent positives and negatives as documented in the HPI  Physical Exam: Vitals:   11/05/18 1600 11/05/18 1807  BP: 106/61 109/62  Pulse:  69  Resp: (!) 22   Temp: 100 F (37.8 C)   SpO2: 92%    Gen: A&Ox3, no distress  Head: normocephalic, atraumatic Eyes: extraocular motions intact, anicteric.  Neck: supple without mass or thyromegaly Chest: unlabored respirations, symmetrical air entry, has a wet sounding but nonproductive cough Cardiovascular: RRR Abdomen: soft, nondistended, minimally tender to deep palpation of the subxiphoid and medial right subcostal margin.  No peritoneal signs no mass or organomegaly.  Extremities: warm, no deformities  Neuro: grossly intact Psych: appropriate mood and affect, normal insight  Skin: warm and dry   CBC Latest Ref Rng & Units 11/03/2018 04/18/2016 04/17/2016  WBC 4.0 - 10.5 K/uL 8.9 7.9 7.5  Hemoglobin 13.0 - 17.0 g/dL 16.114.7 09.616.1 04.516.0  Hematocrit 39.0 - 52.0 % 41.9 45.4 44.7  Platelets 150 - 400 K/uL 194 191 174    CMP Latest Ref Rng & Units 11/04/2018 11/03/2018 04/20/2016  Glucose 70 - 99 mg/dL 409(W122(H) 119(J151(H) 97  BUN 8  - 23 mg/dL 13 13 15   Creatinine 0.61 - 1.24 mg/dL 4.781.19 2.95(A1.30(H) 2.131.20  Sodium 135 - 145 mmol/L 136 138 141  Potassium 3.5 - 5.1 mmol/L 4.0 4.3 4.4  Chloride 98 - 111 mmol/L 105 108 102  CO2 22 - 32 mmol/L 24 20(L) 23  Calcium 8.9 - 10.3 mg/dL 0.8(M8.7(L) 5.7(Q8.8(L) 9.2  Total Protein 6.5 - 8.1 g/dL - 5.7(L) -  Total Bilirubin 0.3 - 1.2 mg/dL - 1.0 -  Alkaline Phos 38 - 126 U/L - 57 -  AST 15 - 41 U/L - 19 -  ALT 0 - 44 U/L - 28 -    No results found for: INR, PROTIME  Imaging: Nm Hepatobiliary Liver Func  Result Date: 11/05/2018 CLINICAL DATA:  73 year old male with a history of epigastric pain and CT evidence of cholelithiasis and possible cholecystitis EXAM: NUCLEAR MEDICINE HEPATOBILIARY IMAGING TECHNIQUE: Sequential images of the abdomen were obtained out to 60 minutes following intravenous administration of radiopharmaceutical. RADIOPHARMACEUTICALS:  5.1 mCi Tc-7336m  Choletec IV COMPARISON:  CT November 04, 2018, ultrasound November 04, 2018 FINDINGS: Planar imaging of the abdomen performed after administration of nuclear medicine radiotracer. After 30 minutes of imaging, 3 mg IV morphine was administered with continuation of the planar imaging. 120 minutes of planar imaging acquired. Prompt uniform uptake and biliary excretion of activity by the liver is seen. Gallbladder activity is never visualized. Biliary activity passes into small bowel, consistent with patent common bile duct. IMPRESSION: HIDA study is positive for acute calculus cholecystitis. Electronically Signed   By: Gilmer MorJaime  Wagner D.O.   On: 11/05/2018 16:03   [place] Abdomen Complete  Result Date: 11/04/2018 CLINICAL DATA:  Initial evaluation for acute abdominal pain for 2 days. EXAM: ABDOMEN ULTRASOUND COMPLETE COMPARISON:  None available. FINDINGS: Gallbladder: Multiple small stones seen within the gallbladder lumen, largest of which measures 5.5 mm. Gallbladder wall minimally thickened to 4 mm, which could be related incomplete distension. No free  pericholecystic fluid. No sonographic Murphy sign elicited on exam. Common bile duct: Diameter: 4.8 mm Liver: No focal lesion identified. Mildly increased echogenicity within the hepatic parenchyma, suggesting steatosis. Portal vein is patent on color Doppler imaging with normal direction of blood flow towards the  liver. IVC: No abnormality visualized. Pancreas: Not visualized. Spleen: Size and appearance within normal limits. Right Kidney: Length: 12.1 cm. Echogenicity within normal limits. No hydronephrosis. Few scattered simple cyst present, largest of which measures 4.8 x 4.7 x 4.4 cm at the upper pole. Left Kidney: Length: 13.7 mm. Echogenicity within normal limits. No mass or hydronephrosis visualized. Abdominal aorta: No aneurysm visualized. Other findings: None. IMPRESSION: 1. Cholelithiasis with minimal gallbladder wall thickening. No other sonographic features for acute cholecystitis. No biliary dilatation. 2. Mildly increased echogenicity within the patent parenchyma, suggesting steatosis. 3. Scattered simple right renal cysts measuring up to 4.8 cm. 4. Otherwise negative abdominal ultrasound. No other acute abnormality identified. Electronically Signed   By: Jeannine Boga M.D.   On: 11/04/2018 05:06   Ct Abdomen Pelvis W Contrast  Result Date: 11/04/2018 CLINICAL DATA:  Epigastric pain and tenderness. Stage 3 kidney disease. EXAM: CT ABDOMEN AND PELVIS WITH CONTRAST TECHNIQUE: Multidetector CT imaging of the abdomen and pelvis was performed using the standard protocol following bolus administration of intravenous contrast. CONTRAST:  123mL OMNIPAQUE IOHEXOL 300 MG/ML  SOLN COMPARISON:  Right upper quadrant ultrasound today. FINDINGS: Lower chest: Atelectasis right base. Cardiac pacer leads are present. Hepatobiliary: Liver and biliary tree are within normal. Mild to moderate cholelithiasis is present. Subtle stranding of the fat adjacent the gallbladder. No significant wall thickening.  Pancreas: Normal. Spleen: Multiple calcified granulomas. Adrenals/Urinary Tract: Adrenal glands are normal. Kidneys are normal in size without hydronephrosis or nephrolithiasis. Right renal cyst measuring 5.6 cm. 1 cm right renal hypodensity too small to characterize but likely a cyst. Ureters and bladder are normal. Stomach/Bowel: Stomach and small bowel are normal. Appendix is not visualized. Mild diverticulosis of the colon. Vascular/Lymphatic: Mild calcified plaque over the abdominal aorta. No adenopathy. Reproductive: Normal. Other: No free fluid. Musculoskeletal: Mild degenerative change of the spine and hips. IMPRESSION: Mild to moderate cholelithiasis. Subtle stranding of the pericholecystic fat which could be seen with mild acute cholecystitis. Note that the right upper quadrant ultrasound earlier today was also somewhat equivocal for acute cholecystitis. Consider HIDA scan for further evaluation. 5.6 cm right renal cyst and 1 cm right renal cortical hypodensity too small to characterize but likely a cyst. Recommend follow-up CT 6 months. Colonic diverticulosis. Aortic Atherosclerosis (ICD10-I70.0). Electronically Signed   By: Marin Olp M.D.   On: 11/04/2018 18:50   Echo 11/03/18:  1. The left ventricle has severely reduced systolic function, with an ejection fraction of 20-25%. The cavity size was severely dilated. Left ventricular diastolic Doppler parameters are consistent with pseudonormalization. Left ventricular diffuse  hypokinesis.  2. The right ventricle has normal systolic function. The cavity was normal.  3. Left atrial size was mildly dilated.  4. The mitral valve is grossly normal. There is severe mitral annular calcification present.  5. The tricuspid valve is grossly normal.  6. The aortic valve is tricuspid. Mild thickening of the aortic valve. No stenosis of the aortic valve.  7. The aortic root is normal in size and structure.  8. Severe global reduction in LV systolic  function; moderate diastolic dysfunction; severe LVE; mild MR and TR; mild LAE.  9. The inferior vena cava was normal in size with <50% respiratory variability.  A/P: 73 year old gentleman with multiple significant medical comorbidities including CHF with EF 20 to 25%, recent bout of epigastric pain nausea and dry heaves and positive HIDA.  Interestingly his symptoms appear to be resolved at the time of the consultation.  I discussed with  him the treatments for cholecystitis including laparoscopic cholecystectomy with risks of surgery including bleeding, pain, scarring, intraabdominal injury specifically to the common bile duct and sequelae, bile leak, conversion to open surgery, blood clot, pneumonia, heart attack/arrhythmia/exacerbation of CHF, stroke, failure to resolve symptoms, exacerbation of chronic kidney disease, etc. discussed that he is high risk for any surgical intervention.  Discussed the alternative treatment of percutaneous cholecystostomy tube in radiology which can, if necessary, be a permanent treatment although we discussed that the tube is usually uncomfortable and often times can ultimately be removed but this leaves him with a risk of recurrent disease.  A third alternative, given resolution of symptoms, though not ideal is ongoing observation although given his precedent symptoms he is likely to have another attack and we discussed risks of complications of that, sepsis, etc.  Questions welcomed and answered.  He states that given his cardiac history he hopes to avoid surgery.  Zosyn has been initiated by primary team. Please contact cardiology for formal preoperative risk assessment, suspect that he would best be served by percutaneous cholecystostomy tube.  We will continue to follow.    Phylliss Blakeshelsea , MD [facility], [place] Pager [phone]

## 2018-11-05 NOTE — Progress Notes (Addendum)
Daily Rounding Note  11/05/2018, 10:19 AM  LOS: 1 day   SUBJECTIVE:   Chief complaint:  Epigastric pain   Pain overall better.   Temp to 101.1 @ 0615, received Tylenol with resolution of fever.   Bentyl helped epigastric pain last PM.  Tylenol relieved pain at 0540 this AM.  No nausea.  No dyspnea HIDA planned for today.    OBJECTIVE:         Vital signs in last 24 hours:    Temp:  [97.5 F (36.4 C)-101.1 F (38.4 C)] 99.4 F (37.4 C) (07/20 0642) Pulse Rate:  [65-69] 69 (07/20 0512) Resp:  [12-26] 26 (07/20 0512) BP: (109-119)/(64-68) 109/65 (07/20 0512) SpO2:  [91 %-93 %] 93 % (07/20 0512) Weight:  [106.6 kg] 106.6 kg (07/20 0512) Last BM Date: 11/03/18 Filed Weights   11/03/18 1605 11/04/18 0621 11/05/18 0512  Weight: 107 kg 108.2 kg 106.6 kg   General: looks well.  Comfortable, NAD   Heart: RRR Chest: clear bil  No labored breathing Abdomen: soft, NT.  ND, active BS  Extremities: no CCE Neuro/Psych:  Oriented x 3.  Fully alert.  No gross deficits  Intake/Output from previous day: 07/19 0701 - 07/20 0700 In: 1660 [P.O.:1660] Out: 1600 [Urine:1600]  Intake/Output this shift: No intake/output data recorded.  Lab Results: Recent Labs    11/03/18 1044  WBC 8.9  HGB 14.7  HCT 41.9  PLT 194   BMET Recent Labs    11/03/18 1044 11/04/18 0755  NA 138 136  K 4.3 4.0  CL 108 105  CO2 20* 24  GLUCOSE 151* 122*  BUN 13 13  CREATININE 1.30* 1.19  CALCIUM 8.8* 8.7*   LFT Recent Labs    11/03/18 1227  PROT 5.7*  ALBUMIN 3.4*  AST 19  ALT 28  ALKPHOS 57  BILITOT 1.0  BILIDIR 0.2  IBILI 0.8   PT/INR No results for input(s): LABPROT, INR in the last 72 hours. Hepatitis Panel No results for input(s): HEPBSAG, HCVAB, HEPAIGM, HEPBIGM in the last 72 hours.  Studies/Results: US Abdomen Complete  Result Date: 11/04/2018 CLINICAL DATA:  Initial evaluation for acute abdominal pain for 2 days.  EXAM: ABDOMEN ULTRASOUND COMPLETE COMPARISON:  None available. FINDINGS: Gallbladder: Multiple small stones seen within the gallbladder lumen, largest of which measures 5.5 mm. Gallbladder wall minimally thickened to 4 mm, which could be related incomplete distension. No free pericholecystic fluid. No sonographic Murphy sign elicited on exam. Common bile duct: Diameter: 4.8 mm Liver: No focal lesion identified. Mildly increased echogenicity within the hepatic parenchyma, suggesting steatosis. Portal vein is patent on color Doppler imaging with normal direction of blood flow towards the liver. IVC: No abnormality visualized. Pancreas: Not visualized. Spleen: Size and appearance within normal limits. Right Kidney: Length: 12.1 cm. Echogenicity within normal limits. No hydronephrosis. Few scattered simple cyst present, largest of which measures 4.8 x 4.7 x 4.4 cm at the upper pole. Left Kidney: Length: 13.7 mm. Echogenicity within normal limits. No mass or hydronephrosis visualized. Abdominal aorta: No aneurysm visualized. Other findings: None. IMPRESSION: 1. Cholelithiasis with minimal gallbladder wall thickening. No other sonographic features for acute cholecystitis. No biliary dilatation. 2. Mildly increased echogenicity within the patent parenchyma, suggesting steatosis. 3. Scattered simple right renal cysts measuring up to 4.8 cm. 4. Otherwise negative abdominal ultrasound. No other acute abnormality identified. Electronically Signed   By: Rise Mu M.D.   On: 11/04/2018 05:06   Ct  Abdomen Pelvis W Contrast  Result Date: 11/04/2018 CLINICAL DATA:  Epigastric pain and tenderness. Stage 3 kidney disease. EXAM: CT ABDOMEN AND PELVIS WITH CONTRAST TECHNIQUE: Multidetector CT imaging of the abdomen and pelvis was performed using the standard protocol following bolus administration of intravenous contrast. CONTRAST:  182mL OMNIPAQUE IOHEXOL 300 MG/ML  SOLN COMPARISON:  Right upper quadrant ultrasound  today. FINDINGS: Lower chest: Atelectasis right base. Cardiac pacer leads are present. Hepatobiliary: Liver and biliary tree are within normal. Mild to moderate cholelithiasis is present. Subtle stranding of the fat adjacent the gallbladder. No significant wall thickening. Pancreas: Normal. Spleen: Multiple calcified granulomas. Adrenals/Urinary Tract: Adrenal glands are normal. Kidneys are normal in size without hydronephrosis or nephrolithiasis. Right renal cyst measuring 5.6 cm. 1 cm right renal hypodensity too small to characterize but likely a cyst. Ureters and bladder are normal. Stomach/Bowel: Stomach and small bowel are normal. Appendix is not visualized. Mild diverticulosis of the colon. Vascular/Lymphatic: Mild calcified plaque over the abdominal aorta. No adenopathy. Reproductive: Normal. Other: No free fluid. Musculoskeletal: Mild degenerative change of the spine and hips. IMPRESSION: Mild to moderate cholelithiasis. Subtle stranding of the pericholecystic fat which could be seen with mild acute cholecystitis. Note that the right upper quadrant ultrasound earlier today was also somewhat equivocal for acute cholecystitis. Consider HIDA scan for further evaluation. 5.6 cm right renal cyst and 1 cm right renal cortical hypodensity too small to characterize but likely a cyst. Recommend follow-up CT 6 months. Colonic diverticulosis. Aortic Atherosclerosis (ICD10-I70.0). Electronically Signed   By: Marin Olp M.D.   On: 11/04/2018 18:50   Dg Chest Port 1 View  Result Date: 11/03/2018 CLINICAL DATA:  Chest pain and shortness of breath. EXAM: PORTABLE CHEST 1 VIEW COMPARISON:  Chest x-ray dated 04/15/2016. FINDINGS: LEFT chest wall pacemaker/ICD apparatus appears stable in position. Median sternotomy wires appear intact. Lungs are clear. No pleural effusion or pneumothorax seen. No acute appearing osseous abnormality. IMPRESSION: No active disease. No evidence of pneumonia or pulmonary edema.  Electronically Signed   By: Franki Cabot M.D.   On: 11/03/2018 10:41    ASSESMENT:   *   Epigastric pain, nausea, dry heaves.   LFTs normal.   Ultrasound: Cholelithiais.  Fatty liver.  No CBD stone or dilation.   CTAP:  Cholelithiasis, mild pericholecystic stranding, consider HIDA scan to r/o cholecystitis.    *  Ischemic CM (EF 20% - 25% per Echo of 11/03/18).  Well compensated.   CAD. Previous stent and CABG.  Chronic Plavix on hold.     PLAN   *    Await completion of HIDA.  If Positive, will need gen surgery eval.  If negative, ? EGD tmrw, would be diagnostic given Plavix 7/18  *   Allow clears after HIDA finishes.      Azucena Freed  11/05/2018, 10:19 AM Phone [phone]   Attending physician's note   I have taken an interval history, reviewed the chart and examined the patient. I agree with the Advanced Practitioner's note, impression and recommendations.   CT abdomen pelvis with cholelithiasis and mild pericholecystic fluid with stranding  Await HIDA scan  If positive for cholecystitis, consult surgery  If HIDA negative, will consider EGD for further evaluation of epigastric abdominal pain to exclude peptic ulcer disease N.p.o. after midnight    K. Denzil Magnuson , MD [phone]

## 2018-11-06 DIAGNOSIS — Z951 Presence of aortocoronary bypass graft: Secondary | ICD-10-CM

## 2018-11-06 DIAGNOSIS — I2510 Atherosclerotic heart disease of native coronary artery without angina pectoris: Secondary | ICD-10-CM

## 2018-11-06 DIAGNOSIS — I255 Ischemic cardiomyopathy: Secondary | ICD-10-CM

## 2018-11-06 LAB — CBC
HCT: 37.4 % — ABNORMAL LOW (ref 39.0–52.0)
Hemoglobin: 13.1 g/dL (ref 13.0–17.0)
MCH: 32.7 pg (ref 26.0–34.0)
MCHC: 35 g/dL (ref 30.0–36.0)
MCV: 93.3 fL (ref 80.0–100.0)
Platelets: 153 10*3/uL (ref 150–400)
RBC: 4.01 MIL/uL — ABNORMAL LOW (ref 4.22–5.81)
RDW: 11.8 % (ref 11.5–15.5)
WBC: 10.6 10*3/uL — ABNORMAL HIGH (ref 4.0–10.5)
nRBC: 0 % (ref 0.0–0.2)

## 2018-11-06 LAB — CREATININE, SERUM
Creatinine, Ser: 1.89 mg/dL — ABNORMAL HIGH (ref 0.61–1.24)
GFR calc Af Amer: 40 mL/min — ABNORMAL LOW (ref >60)
GFR calc non Af Amer: 34 mL/min — ABNORMAL LOW (ref >60)

## 2018-11-06 MED ORDER — ENOXAPARIN SODIUM 40 MG/0.4ML ~~LOC~~ SOLN
40.0000 mg | SUBCUTANEOUS | Status: DC
  Administered 2018-11-07: 40 mg via SUBCUTANEOUS
  Filled 2018-11-06 (×2): qty 0.4

## 2018-11-06 MED ORDER — HYDROMORPHONE HCL 1 MG/ML IJ SOLN
1.0000 mg | Freq: Once | INTRAMUSCULAR | Status: AC
  Administered 2018-11-06: 13:00:00 1 mg via INTRAVENOUS
  Filled 2018-11-06: qty 1

## 2018-11-06 NOTE — Progress Notes (Signed)
Subjective/Chief Complaint: Pt with no abdominal pain    Objective: Vital signs in last 24 hours: Temp:  [98.2 F (36.8 C)-100.9 F (38.3 C)] 98.2 F (36.8 C) (07/21 0454) Pulse Rate:  [69-85] 85 (07/21 0454) Resp:  [18-22] 18 (07/21 0454) BP: (106-114)/(57-78) 114/78 (07/21 0454) SpO2:  [92 %-97 %] 97 % (07/21 0454) Weight:  [105 kg] 105 kg (07/21 0454) Last BM Date: 11/03/18  Intake/Output from previous day: 07/20 0701 - 07/21 0700 In: 906.9 [P.O.:812; I.V.:31.9; IV Piggyback:63] Out: 1000 [Urine:1000] Intake/Output this shift: No intake/output data recorded.  Constitutional: No acute distress, conversant, appears states age. Eyes: Anicteric sclerae, moist conjunctiva, no lid lag Lungs: Clear to auscultation bilaterally, normal respiratory effort CV: regular rate and rhythm, no murmurs, no peripheral edema, pedal pulses 2+ GI: Soft, no masses or hepatosplenomegaly, non-tender to palpation Skin: No rashes, palpation reveals normal turgor Psychiatric: appropriate judgment and insight, oriented to person, place, and time   Lab Results:  Recent Labs    11/03/18 1044  WBC 8.9  HGB 14.7  HCT 41.9  PLT 194   BMET Recent Labs    11/03/18 1044 11/04/18 0755  NA 138 136  K 4.3 4.0  CL 108 105  CO2 20* 24  GLUCOSE 151* 122*  BUN 13 13  CREATININE 1.30* 1.19  CALCIUM 8.8* 8.7*   Studies/Results: Nm Hepatobiliary Liver Func  Result Date: 11/05/2018 CLINICAL DATA:  73 year old male with a history of epigastric pain and CT evidence of cholelithiasis and possible cholecystitis EXAM: NUCLEAR MEDICINE HEPATOBILIARY IMAGING TECHNIQUE: Sequential images of the abdomen were obtained out to 60 minutes following intravenous administration of radiopharmaceutical. RADIOPHARMACEUTICALS:  5.1 mCi Tc-3m  Choletec IV COMPARISON:  CT November 04, 2018, ultrasound November 04, 2018 FINDINGS: Planar imaging of the abdomen performed after administration of nuclear medicine radiotracer.  After 30 minutes of imaging, 3 mg IV morphine was administered with continuation of the planar imaging. 120 minutes of planar imaging acquired. Prompt uniform uptake and biliary excretion of activity by the liver is seen. Gallbladder activity is never visualized. Biliary activity passes into small bowel, consistent with patent common bile duct. IMPRESSION: HIDA study is positive for acute calculus cholecystitis. Electronically Signed   By: Corrie Mckusick D.O.   On: 11/05/2018 16:03   Ct Abdomen Pelvis W Contrast  Result Date: 11/04/2018 CLINICAL DATA:  Epigastric pain and tenderness. Stage 3 kidney disease. EXAM: CT ABDOMEN AND PELVIS WITH CONTRAST TECHNIQUE: Multidetector CT imaging of the abdomen and pelvis was performed using the standard protocol following bolus administration of intravenous contrast. CONTRAST:  151mL OMNIPAQUE IOHEXOL 300 MG/ML  SOLN COMPARISON:  Right upper quadrant ultrasound today. FINDINGS: Lower chest: Atelectasis right base. Cardiac pacer leads are present. Hepatobiliary: Liver and biliary tree are within normal. Mild to moderate cholelithiasis is present. Subtle stranding of the fat adjacent the gallbladder. No significant wall thickening. Pancreas: Normal. Spleen: Multiple calcified granulomas. Adrenals/Urinary Tract: Adrenal glands are normal. Kidneys are normal in size without hydronephrosis or nephrolithiasis. Right renal cyst measuring 5.6 cm. 1 cm right renal hypodensity too small to characterize but likely a cyst. Ureters and bladder are normal. Stomach/Bowel: Stomach and small bowel are normal. Appendix is not visualized. Mild diverticulosis of the colon. Vascular/Lymphatic: Mild calcified plaque over the abdominal aorta. No adenopathy. Reproductive: Normal. Other: No free fluid. Musculoskeletal: Mild degenerative change of the spine and hips. IMPRESSION: Mild to moderate cholelithiasis. Subtle stranding of the pericholecystic fat which could be seen with mild acute  cholecystitis.  Note that the right upper quadrant ultrasound earlier today was also somewhat equivocal for acute cholecystitis. Consider HIDA scan for further evaluation. 5.6 cm right renal cyst and 1 cm right renal cortical hypodensity too small to characterize but likely a cyst. Recommend follow-up CT 6 months. Colonic diverticulosis. Aortic Atherosclerosis (ICD10-I70.0). Electronically Signed   By: Elberta Fortis M.D.   On: 11/04/2018 18:50    Anti-infectives: Anti-infectives (From admission, onward)   Start     Dose/Rate Route Frequency Ordered Stop   11/05/18 1900  piperacillin-tazobactam (ZOSYN) IVPB 3.375 g     3.375 g 12.5 mL/hr over 240 Minutes Intravenous Every 8 hours 11/05/18 1839        Assessment/Plan: 58 M with acute cholecystitis Patient Active Problem List   Diagnosis Date Noted  . Midepigastric pain 11/03/2018  . Chest pain 11/03/2018  . Nonspecific chest pain   . CAD (coronary artery disease) 04/18/2016  . Cardiomyopathy, ischemic 04/18/2016  . NSTEMI (non-ST elevated myocardial infarction) (HCC) 04/15/2016  . HTN (hypertension) 04/15/2016  . Chronic systolic heart failure (HCC) 04/15/2016  . CKD (chronic kidney disease), stage III (HCC) 04/15/2016  . BPH (benign prostatic hyperplasia) 04/15/2016  . ST elevation myocardial infarction involving right coronary artery (HCC)   . S/P CABG x 2   . Hyperlipidemia   . NSVT (nonsustained ventricular tachycardia) (HCC)   . Acute inferior myocardial infarction (HCC) 02/12/2015    1. Will await cards eval 2. If high risk candidate would rec IR cholecystostomy tube placement. 3. Con't abx 4. Following.  LOS: 2 days    Axel Filler 11/06/2018

## 2018-11-06 NOTE — Progress Notes (Signed)
PROGRESS NOTE  RISHAWN MCEWAN XFG:[id number] DOB: Apr 17, 1946   PCP: Oval Linsey, MD  Patient is from: Home  DOA: 11/03/2018 LOS: 2  Brief Narrative / Interim history: 73 year old male with history of CAD, ICM with EF of 20%, HTN, HLD, tobacco use and CKD 3 presenting with lower sternal and epigastric pain.  Troponin has been flat and within normal range.  Seen by cardiology team and pain thought to be noncardiac.  Echo with EF of 20 to 25%.  GI on board.  Liver enzymes and lipase within normal range.  RUQ ultrasound and CT abdomen and pelvis with cholelithiasis and gallbladder wall thickening.  HIDA scan positive for acute calculus cholecystitis.  IV Zosyn started.  General surgery consulted and waiting on clearance by cardiology given his underlying cardiac history although he is well compensated.  Subjective: No major events overnight of this morning.  Denies abdominal pain, nausea, vomiting, chest pain or dyspnea.  Denies dyspnea or chest pain with exertion such as yard work and ambulation.  Admits to feeling short of breath after some flights of stairs.   Objective: Vitals:   11/05/18 2012 11/06/18 0001 11/06/18 0454 11/06/18 1300  BP: (!) 111/57  114/78 122/68  Pulse: 74  85 64  Resp: (!) 22  18 17   Temp: (!) 100.9 F (38.3 C) 98.5 F (36.9 C) 98.2 F (36.8 C) 98 F (36.7 C)  TempSrc: Oral Oral Oral Oral  SpO2: 94%  97% 98%  Weight:   105 kg   Height:        Intake/Output Summary (Last 24 hours) at 11/06/2018 1451 Last data filed at 11/06/2018 1300 Gross per 24 hour  Intake 906.93 ml  Output 500 ml  Net 406.93 ml   Filed Weights   11/04/18 0621 11/05/18 0512 11/06/18 0454  Weight: 108.2 kg 106.6 kg 105 kg    Examination:  GENERAL: No acute distress.  Appears well.  Sitting on bedside chair. HEENT: MMM.  Vision and hearing grossly intact.  NECK: Supple.  No apparent JVD.  RESP:  No IWOB. Good air movement bilaterally. CVS:  RRR. Heart sounds normal.   ABD/GI/GU: Bowel sounds present. Soft.  Tenderness over epigastric area. MSK/EXT:  Moves extremities. No apparent deformity or edema.  SKIN: no apparent skin lesion or wound NEURO: Awake, alert and oriented appropriately.  No gross deficit.  PSYCH: Calm. Normal affect.   I have personally reviewed the following labs and images:  Radiology Studies: Nm Hepatobiliary Liver Func  Result Date: 11/05/2018 CLINICAL DATA:  73 year old male with a history of epigastric pain and CT evidence of cholelithiasis and possible cholecystitis EXAM: NUCLEAR MEDICINE HEPATOBILIARY IMAGING TECHNIQUE: Sequential images of the abdomen were obtained out to 60 minutes following intravenous administration of radiopharmaceutical. RADIOPHARMACEUTICALS:  5.1 mCi Tc-70m  Choletec IV COMPARISON:  CT November 04, 2018, ultrasound November 04, 2018 FINDINGS: Planar imaging of the abdomen performed after administration of nuclear medicine radiotracer. After 30 minutes of imaging, 3 mg IV morphine was administered with continuation of the planar imaging. 120 minutes of planar imaging acquired. Prompt uniform uptake and biliary excretion of activity by the liver is seen. Gallbladder activity is never visualized. Biliary activity passes into small bowel, consistent with patent common bile duct. IMPRESSION: HIDA study is positive for acute calculus cholecystitis. Electronically Signed   By: Gilmer Mor D.O.   On: 11/05/2018 16:03    Microbiology: Recent Results (from the past 240 hour(s))  SARS Coronavirus 2 (CEPHEID - Performed in [facility]  hospital lab), Hosp Order     Status: None   Collection Time: 11/03/18 10:55 AM   Specimen: Nasopharyngeal Swab  Result Value Ref Range Status   SARS Coronavirus 2 NEGATIVE NEGATIVE Final    Comment: (NOTE) If result is NEGATIVE SARS-CoV-2 target nucleic acids are NOT DETECTED. The SARS-CoV-2 RNA is generally detectable in upper and lower  respiratory specimens during the acute phase of  infection. The lowest  concentration of SARS-CoV-2 viral copies this assay can detect is 250  copies / mL. A negative result does not preclude SARS-CoV-2 infection  and should not be used as the sole basis for treatment or other  patient management decisions.  A negative result may occur with  improper specimen collection / handling, submission of specimen other  than nasopharyngeal swab, presence of viral mutation(s) within the  areas targeted by this assay, and inadequate number of viral copies  (<250 copies / mL). A negative result must be combined with clinical  observations, patient history, and epidemiological information. If result is POSITIVE SARS-CoV-2 target nucleic acids are DETECTED. The SARS-CoV-2 RNA is generally detectable in upper and lower  respiratory specimens dur ing the acute phase of infection.  Positive  results are indicative of active infection with SARS-CoV-2.  Clinical  correlation with patient history and other diagnostic information is  necessary to determine patient infection status.  Positive results do  not rule out bacterial infection or co-infection with other viruses. If result is PRESUMPTIVE POSTIVE SARS-CoV-2 nucleic acids MAY BE PRESENT.   A presumptive positive result was obtained on the submitted specimen  and confirmed on repeat testing.  While 2019 novel coronavirus  (SARS-CoV-2) nucleic acids may be present in the submitted sample  additional confirmatory testing may be necessary for epidemiological  and / or clinical management purposes  to differentiate between  SARS-CoV-2 and other Sarbecovirus currently known to infect humans.  If clinically indicated additional testing with an alternate test  methodology [phone] is advised. The SARS-CoV-2 RNA is generally  detectable in upper and lower respiratory sp ecimens during the acute  phase of infection. The expected result is Negative. Fact Sheet for Patients:   BoilerBrush.com.cyhttps://www.fda.gov/media/136312/download Fact Sheet for Healthcare Providers: https://pope.com/https://www.fda.gov/media/136313/download This test is not yet approved or cleared by the [place] FDA and has been authorized for detection and/or diagnosis of SARS-CoV-2 by FDA under an Emergency Use Authorization (EUA).  This EUA will remain in effect (meaning this test can be used) for the duration of the COVID-19 declaration under Section 564(b)(1) of the Act, 21 U.S.C. section 360bbb-3(b)(1), unless the authorization is terminated or revoked sooner. Performed at [facility], [address] [place], [place] [address]     Sepsis Labs: Invalid input(s): PROCALCITONIN, LACTICIDVEN  Urine analysis: No results found for: COLORURINE, APPEARANCEUR, LABSPEC, PHURINE, GLUCOSEU, HGBUR, BILIRUBINUR, KETONESUR, PROTEINUR, UROBILINOGEN, NITRITE, LEUKOCYTESUR  Anemia Panel: No results for input(s): VITAMINB12, FOLATE, FERRITIN, TIBC, IRON, RETICCTPCT in the last 72 hours.  Thyroid Function Tests: No results for input(s): TSH, T4TOTAL, FREET4, T3FREE, THYROIDAB in the last 72 hours.  Lipid Profile: No results for input(s): CHOL, HDL, LDLCALC, TRIG, CHOLHDL, LDLDIRECT in the last 72 hours.  CBG: No results for input(s): GLUCAP in the last 168 hours.  HbA1C: No results for input(s): HGBA1C in the last 72 hours.  BNP (last 3 results): No results for input(s): PROBNP in the last 8760 hours.  Cardiac Enzymes: No results for input(s): CKTOTAL, CKMB, CKMBINDEX, TROPONINI in the last 168 hours.  Coagulation Profile: No results for input(s): INR, PROTIME in the last 168 hours.  Liver Function Tests: Recent Labs  Lab 11/03/18 1227  AST 19  ALT 28  ALKPHOS 57  BILITOT 1.0  PROT 5.7*  ALBUMIN 3.4*   Recent Labs  Lab 11/03/18 1227  LIPASE 24   No results for input(s): AMMONIA in the last 168 hours.  Basic Metabolic Panel: Recent Labs  Lab 11/03/18 1044 11/04/18 0755  NA 138 136  K  4.3 4.0  CL 108 105  CO2 20* 24  GLUCOSE 151* 122*  BUN 13 13  CREATININE 1.30* 1.19  CALCIUM 8.8* 8.7*   GFR: Estimated Creatinine Clearance: 69.3 mL/min (by C-G formula based on SCr of 1.19 mg/dL).  CBC: Recent Labs  Lab 11/03/18 1044  WBC 8.9  HGB 14.7  HCT 41.9  MCV 93.7  PLT 194    Procedures:  None  Microbiology summarized: COVID-19 negative.  Assessment & Plan: Acute calculus cholecystitis: Chest/epigastric pain resolved. -RUQ ultrasound and CT A/P revealed cholelithiasis with minimal gallbladder wall thickening. -HIDA scan confirmed acute calculus cholecystitis -Continue Zosyn and PPI -Cardiology consulted for cardiac clearance given his history -General surgery following-waiting on cardiac clearance -GI signed off  Systolic CHF/ICM: Echo with EF of 20 to 25% (stable).  Appears euvolemic and compensated. -Continue home medications (metoprolol, Lasix, atorvastatin, Zetia and aspirin) -Holding Plavix. -Closely monitor cardiopulmonary status.  History of CAD/CABG in 1997 and a stent in 2016 and 2017: came in with chest pain likely due to #1.  Chest pain resolved now.  He denies cardiopulmonary symptoms with fair amount of exertion at baseline. -Cardiac meds as above  CKD-3: Stable -Continue monitoring  Hypertension: Normotensive. -Cardiac meds as above.  BPH: Stable -Continue home Flomax  DVT prophylaxis: Subcu Lovenox Code Status: Full code Family Communication: Patient and/or RN. Available if any question.  Disposition Plan: Remains inpatient for further evaluation of acute calculus cholecystitis Consultants: Cardiology, gastroenterology   Antimicrobials: Anti-infectives (From admission, onward)   Start     Dose/Rate Route Frequency Ordered Stop   11/05/18 1900  piperacillin-tazobactam (ZOSYN) IVPB 3.375 g     3.375 g 12.5 mL/hr over 240 Minutes Intravenous Every 8 hours 11/05/18 1839        Sch Meds:  Scheduled Meds: . aspirin EC  81 mg  Oral q morning - 10a  . atorvastatin  80 mg Oral Daily  . cholecalciferol  1,000 Units Oral QHS  . ezetimibe  10 mg Oral Daily  . furosemide  40 mg Oral Daily  . irbesartan  37.5 mg Oral Daily  . metoprolol succinate  25 mg Oral QPM  . pantoprazole  20 mg Oral Daily  . tamsulosin  0.4 mg Oral q morning - 10a   Continuous Infusions: . sodium chloride Stopped (11/06/18 0257)  . piperacillin-tazobactam (ZOSYN)  IV 3.375 g (11/06/18 1136)   PRN Meds:.sodium chloride, acetaminophen **OR** acetaminophen, alum & mag hydroxide-simeth, HYDROcodone-acetaminophen, nitroGLYCERIN, ondansetron **OR** ondansetron (ZOFRAN) IV    T. [place]  If 7PM-7AM, please contact night-coverage [email or web address] Password [facility] 11/06/2018, 2:51 PM

## 2018-11-06 NOTE — Progress Notes (Addendum)
Progress Note  Patient Name: Stephen Macdonald Date of Encounter: 11/06/2018  Primary Cardiologist: No primary care provider on file.   Subjective   Did have episode of epigastric pain with deep palpation this morning but improved with pain medications.   Inpatient Medications    Scheduled Meds:  aspirin EC  81 mg Oral q morning - 10a   atorvastatin  80 mg Oral Daily   cholecalciferol  1,000 Units Oral QHS   ezetimibe  10 mg Oral Daily   furosemide  40 mg Oral Daily   irbesartan  37.5 mg Oral Daily   metoprolol succinate  25 mg Oral QPM   pantoprazole  20 mg Oral Daily   tamsulosin  0.4 mg Oral q morning - 10a   Continuous Infusions:  sodium chloride Stopped (11/06/18 0257)   piperacillin-tazobactam (ZOSYN)  IV 3.375 g (11/06/18 1136)   PRN Meds: sodium chloride, acetaminophen **OR** acetaminophen, alum & mag hydroxide-simeth, HYDROcodone-acetaminophen, nitroGLYCERIN, ondansetron **OR** ondansetron (ZOFRAN) IV   Vital Signs    Vitals:   11/05/18 1807 11/05/18 2012 11/06/18 0001 11/06/18 0454  BP: 109/62 (!) 111/57  114/78  Pulse: 69 74  85  Resp:  (!) 22  18  Temp:  (!) 100.9 F (38.3 C) 98.5 F (36.9 C) 98.2 F (36.8 C)  TempSrc:  Oral Oral Oral  SpO2:  94%  97%  Weight:    105 kg  Height:        Intake/Output Summary (Last 24 hours) at 11/06/2018 1335 Last data filed at 11/06/2018 0700 Gross per 24 hour  Intake 906.93 ml  Output 1000 ml  Net -93.07 ml   Last 3 Weights 11/06/2018 11/05/2018 11/04/2018  Weight (lbs) 231 lb 8 oz 235 lb 1.6 oz 238 lb 8.6 oz  Weight (kg) 105.008 kg 106.641 kg 108.2 kg      Telemetry    SR - Personally Reviewed  ECG    SR with nonspecific T wave changes - Personally Reviewed  Physical Exam  Pleasant older WM GEN: No acute distress.   Neck: No JVD Cardiac: RRR, no murmurs, rubs, or gallops.  Respiratory: Clear to auscultation bilaterally. GI: Soft, nontender, non-distended  MS: No edema; No  deformity. Neuro:  Nonfocal  Psych: Normal affect   Labs    High Sensitivity Troponin:   Recent Labs  Lab 11/03/18 1044 11/03/18 1227  TROPONINIHS 9 10      Cardiac EnzymesNo results for input(s): TROPONINI in the last 168 hours. No results for input(s): TROPIPOC in the last 168 hours.   Chemistry Recent Labs  Lab 11/03/18 1044 11/03/18 1227 11/04/18 0755  NA 138  --  136  K 4.3  --  4.0  CL 108  --  105  CO2 20*  --  24  GLUCOSE 151*  --  122*  BUN 13  --  13  CREATININE 1.30*  --  1.19  CALCIUM 8.8*  --  8.7*  PROT  --  5.7*  --   ALBUMIN  --  3.4*  --   AST  --  19  --   ALT  --  28  --   ALKPHOS  --  57  --   BILITOT  --  1.0  --   GFRNONAA 54*  --  >60  GFRAA >60  --  >60  ANIONGAP 10  --  7     Hematology Recent Labs  Lab 11/03/18 1044  WBC 8.9  RBC 4.47  HGB 14.7  HCT 41.9  MCV 93.7  MCH 32.9  MCHC 35.1  RDW 11.9  PLT 194    BNPNo results for input(s): BNP, PROBNP in the last 168 hours.   DDimer No results for input(s): DDIMER in the last 168 hours.   Radiology    Nm Hepatobiliary Liver Func  Result Date: 11/05/2018 CLINICAL DATA:  73 year old male with a history of epigastric pain and CT evidence of cholelithiasis and possible cholecystitis EXAM: NUCLEAR MEDICINE HEPATOBILIARY IMAGING TECHNIQUE: Sequential images of the abdomen were obtained out to 60 minutes following intravenous administration of radiopharmaceutical. RADIOPHARMACEUTICALS:  5.1 mCi Tc-14m  Choletec IV COMPARISON:  CT November 04, 2018, ultrasound November 04, 2018 FINDINGS: Planar imaging of the abdomen performed after administration of nuclear medicine radiotracer. After 30 minutes of imaging, 3 mg IV morphine was administered with continuation of the planar imaging. 120 minutes of planar imaging acquired. Prompt uniform uptake and biliary excretion of activity by the liver is seen. Gallbladder activity is never visualized. Biliary activity passes into small bowel, consistent with  patent common bile duct. IMPRESSION: HIDA study is positive for acute calculus cholecystitis. Electronically Signed   By: Corrie Mckusick D.O.   On: 11/05/2018 16:03   Ct Abdomen Pelvis W Contrast  Result Date: 11/04/2018 CLINICAL DATA:  Epigastric pain and tenderness. Stage 3 kidney disease. EXAM: CT ABDOMEN AND PELVIS WITH CONTRAST TECHNIQUE: Multidetector CT imaging of the abdomen and pelvis was performed using the standard protocol following bolus administration of intravenous contrast. CONTRAST:  165mL OMNIPAQUE IOHEXOL 300 MG/ML  SOLN COMPARISON:  Right upper quadrant ultrasound today. FINDINGS: Lower chest: Atelectasis right base. Cardiac pacer leads are present. Hepatobiliary: Liver and biliary tree are within normal. Mild to moderate cholelithiasis is present. Subtle stranding of the fat adjacent the gallbladder. No significant wall thickening. Pancreas: Normal. Spleen: Multiple calcified granulomas. Adrenals/Urinary Tract: Adrenal glands are normal. Kidneys are normal in size without hydronephrosis or nephrolithiasis. Right renal cyst measuring 5.6 cm. 1 cm right renal hypodensity too small to characterize but likely a cyst. Ureters and bladder are normal. Stomach/Bowel: Stomach and small bowel are normal. Appendix is not visualized. Mild diverticulosis of the colon. Vascular/Lymphatic: Mild calcified plaque over the abdominal aorta. No adenopathy. Reproductive: Normal. Other: No free fluid. Musculoskeletal: Mild degenerative change of the spine and hips. IMPRESSION: Mild to moderate cholelithiasis. Subtle stranding of the pericholecystic fat which could be seen with mild acute cholecystitis. Note that the right upper quadrant ultrasound earlier today was also somewhat equivocal for acute cholecystitis. Consider HIDA scan for further evaluation. 5.6 cm right renal cyst and 1 cm right renal cortical hypodensity too small to characterize but likely a cyst. Recommend follow-up CT 6 months. Colonic  diverticulosis. Aortic Atherosclerosis (ICD10-I70.0). Electronically Signed   By: Marin Olp M.D.   On: 11/04/2018 18:50    Cardiac Studies   TTE: 11/03/18  IMPRESSIONS    1. The left ventricle has severely reduced systolic function, with an ejection fraction of 20-25%. The cavity size was severely dilated. Left ventricular diastolic Doppler parameters are consistent with pseudonormalization. Left ventricular diffuse  hypokinesis.  2. The right ventricle has normal systolic function. The cavity was normal.  3. Left atrial size was mildly dilated.  4. The mitral valve is grossly normal. There is severe mitral annular calcification present.  5. The tricuspid valve is grossly normal.  6. The aortic valve is tricuspid. Mild thickening of the aortic valve. No stenosis of the aortic valve.  7. The  aortic root is normal in size and structure.  8. Severe global reduction in LV systolic function; moderate diastolic dysfunction; severe LVE; mild MR and TR; mild LAE.  9. The inferior vena cava was normal in size with <50% respiratory variability.  Patient Profile     73 y.o. male with CAD and prior CABG in 1997, multiple NSTEMI's and coronary interventions, [facility], presents with CP that awakened him from sleep. Consult on 7/18 by Dr. Rennis GoldenHilty, now planned to under surgery and asked for preop evaluation.   Assessment & Plan    1  Preop cardiac risk stratification.  Pt has 3V CAD, s/p CABG in 1997.  [facility]. PCI to the dSVG-PDA stenosis. [facility] The pt  currently lives at home, does his own yard work and house work without angina. Does get short of breath at times and has to pace himself, but this is not usual for him. Pt at mod increased risk for exacerbation of cardiac problems (CHF)   Not prohibitive  No symtpoms to sugg active ischema   He is in SR   Given stable symptoms, would not anticipate further work up at this time.  Of note has been on plavix prior to admission with last dose yesterday. Would suspect will need 5 days of washout prior to surgery.   2. ICM s/p AICD: known EF of 25-35%. [facility] He has never been schocked  Reports he is close to ERI and has an appt in the coming weeks for follow up. Reports he has about 3 months left on his device, ending in mid October. On medical therapy with BB, ARB, and lasix. Volume stable on exam.  -- will ask for [employer] for interrogation.   3. Cholecystitis: CT confirmed cholelithiasis with pericholecystic fat. HIDA scan + for gallstone cholecystitis.  Surgery following with plans for surgery, possible IR cholecystostomy tube placement.   4. Possible Afib?: reports his cardiologist had told him about possible Afib seen on his last interrogation of his device. Had an appt on Monday to discuss this and possibly stop [facility]  -- interrogation as above.   For questions or updates, please contact [facility] Please consult [email or web address] for contact info under    Signed, Laverda PageLindsay Roberts, NP  11/06/2018, 1:35 PM    Pt seen and examined   I agree with findings of L Su Hiltoberts   I have amended note above to reflect my findings  The pt is fairly active   Denies CP  Breathing is OK ON exam: JVP is normal Lungs are CTA Cardiac RRR   No S3    Abd   RUQ tenderness Ext are witout edema  Tele wih SR  From a cardiac standpoint I feel he is a some increased risk for exacerbation of CHF, less likely ischemia   Overall, given activity level, I think he is OK to proceed without further cardiac testing Follow I/O and exam closely   Continue tele  Follow heart rhythm findings from device interrogation  Dietrich PatesPaula Miracle Criado MD

## 2018-11-06 NOTE — Progress Notes (Signed)
Pharmacy Antibiotic Note  Stephen Macdonald is a 73 y.o. male admitted on 11/03/2018 with lower sternal and epigastric pain. Cardiac origin of pain ruled out. RUQ ultrasound and CT of abdomen/pelvis with cholelithiasis and gallbladder wall thickening; HIDA scan positive; GI consulted. Pharmacy has been consulted for Zosyn dosing for  intra-abdominal infection/cholecystitis.  Plan: Continue zosyn 3.375gm IV Q8H (4 hr inf) F/u renal fxn, C&S, clinical status and LOT *Pharmacy will sign off as no dose adjustments are anticipated. Thank you for the consult!  Height: 6' (182.9 cm) Weight: 231 lb 8 oz (105 kg) IBW/kg (Calculated) : 77.6  Temp (24hrs), Avg:99.4 F (37.4 C), Min:98.2 F (36.8 C), Max:100.9 F (38.3 C)  Recent Labs  Lab 11/03/18 1044 11/04/18 0755  WBC 8.9  --   CREATININE 1.30* 1.19    Estimated Creatinine Clearance: 69.3 mL/min (by C-G formula based on SCr of 1.19 mg/dL).    Allergies  Allergen Reactions  . Lisinopril Cough  . Codeine Other (See Comments)    Tingling skin. "makes my skin crawl"    Microbiology results: 7/18 COVID: negative  Thank you for allowing pharmacy to be a part of this patient's care.  Salome Arnt, PharmD, BCPS Please see AMION for all pharmacy numbers 11/06/2018 8:23 AM

## 2018-11-06 NOTE — Progress Notes (Signed)
HIDA scan positive for acute gallstone cholecystitis. Surgery team is consulting.  We will sign off, available if have any questions  K. Denzil Magnuson , MD [phone]

## 2018-11-07 DIAGNOSIS — Z01810 Encounter for preprocedural cardiovascular examination: Secondary | ICD-10-CM

## 2018-11-07 DIAGNOSIS — R109 Unspecified abdominal pain: Secondary | ICD-10-CM

## 2018-11-07 DIAGNOSIS — K8000 Calculus of gallbladder with acute cholecystitis without obstruction: Secondary | ICD-10-CM | POA: Diagnosis present

## 2018-11-07 DIAGNOSIS — N179 Acute kidney failure, unspecified: Secondary | ICD-10-CM | POA: Diagnosis present

## 2018-11-07 DIAGNOSIS — I5020 Unspecified systolic (congestive) heart failure: Secondary | ICD-10-CM | POA: Diagnosis present

## 2018-11-07 DIAGNOSIS — R945 Abnormal results of liver function studies: Secondary | ICD-10-CM

## 2018-11-07 DIAGNOSIS — R7989 Other specified abnormal findings of blood chemistry: Secondary | ICD-10-CM

## 2018-11-07 DIAGNOSIS — R1010 Upper abdominal pain, unspecified: Secondary | ICD-10-CM

## 2018-11-07 LAB — BASIC METABOLIC PANEL
Anion gap: 9 (ref 5–15)
BUN: 25 mg/dL — ABNORMAL HIGH (ref 8–23)
CO2: 26 mmol/L (ref 22–32)
Calcium: 8 mg/dL — ABNORMAL LOW (ref 8.9–10.3)
Chloride: 97 mmol/L — ABNORMAL LOW (ref 98–111)
Creatinine, Ser: 2.62 mg/dL — ABNORMAL HIGH (ref 0.61–1.24)
GFR calc Af Amer: 27 mL/min — ABNORMAL LOW (ref >60)
GFR calc non Af Amer: 23 mL/min — ABNORMAL LOW (ref >60)
Glucose, Bld: 104 mg/dL — ABNORMAL HIGH (ref 70–99)
Potassium: 3.6 mmol/L (ref 3.5–5.1)
Sodium: 132 mmol/L — ABNORMAL LOW (ref 135–145)

## 2018-11-07 LAB — MAGNESIUM: Magnesium: 2.5 mg/dL — ABNORMAL HIGH (ref 1.7–2.4)

## 2018-11-07 LAB — HEPATIC FUNCTION PANEL
ALT: 530 U/L — ABNORMAL HIGH (ref 0–44)
AST: 435 U/L — ABNORMAL HIGH (ref 15–41)
Albumin: 2.5 g/dL — ABNORMAL LOW (ref 3.5–5.0)
Alkaline Phosphatase: 271 U/L — ABNORMAL HIGH (ref 38–126)
Bilirubin, Direct: 3.2 mg/dL — ABNORMAL HIGH (ref 0.0–0.2)
Indirect Bilirubin: 1.9 mg/dL — ABNORMAL HIGH (ref 0.3–0.9)
Total Bilirubin: 5.1 mg/dL — ABNORMAL HIGH (ref 0.3–1.2)
Total Protein: 5.5 g/dL — ABNORMAL LOW (ref 6.5–8.1)

## 2018-11-07 LAB — CBC
HCT: 37.1 % — ABNORMAL LOW (ref 39.0–52.0)
Hemoglobin: 12.9 g/dL — ABNORMAL LOW (ref 13.0–17.0)
MCH: 32.7 pg (ref 26.0–34.0)
MCHC: 34.8 g/dL (ref 30.0–36.0)
MCV: 94.2 fL (ref 80.0–100.0)
Platelets: 163 10*3/uL (ref 150–400)
RBC: 3.94 MIL/uL — ABNORMAL LOW (ref 4.22–5.81)
RDW: 11.9 % (ref 11.5–15.5)
WBC: 10 10*3/uL (ref 4.0–10.5)
nRBC: 0 % (ref 0.0–0.2)

## 2018-11-07 LAB — LIPASE, BLOOD: Lipase: 29 U/L (ref 11–51)

## 2018-11-07 MED ORDER — FUROSEMIDE 20 MG PO TABS: 20.0000 mg | ORAL_TABLET | Freq: Every day | ORAL | Status: DC

## 2018-11-07 MED ORDER — PANTOPRAZOLE SODIUM 40 MG PO TBEC
40.0000 mg | DELAYED_RELEASE_TABLET | Freq: Every day | ORAL | Status: DC
  Administered 2018-11-08 – 2018-11-10 (×2): 40 mg via ORAL
  Filled 2018-11-07 (×2): qty 1

## 2018-11-07 NOTE — Progress Notes (Addendum)
Patient Demographics:    Stephen Macdonald, is a 73 y.o. male, DOB - 1945-09-01, ZOX:[id number]  Admit date - 11/03/2018   Admitting Physician Carron CurieAli Hijazi, MD  Outpatient Primary MD for the patient is Oval Linseyondiego, Richard, MD  LOS - 3   Chief Complaint  Patient presents with  . 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 %   AKI (acute kidney injury) (HCC)   Acute calculous cholecystitis   HTN (hypertension)   CAD (coronary artery disease)   Chest pain   Chronic systolic heart failure (HCC)   Midepigastric pain  Brief Summary:- History of CAD/CABG (1997), multiple NSTEMI's, last stents 03/2016,  Systolic CHF/ICM with EF of 20 to 25%, admitted on 11/03/2018 with epigastric/chest pain and found to have acute calculus cholecystitis,  General surgery  consulted and plans for lap chole Vs IR cholecystectomy tube placement on 11/09/2018 with cardiology preop clearance   A/p 1)Acute calculus cholecystitis--- LFTs trending up significantly, plan is for lap chole 11/09/2018 Vs IR cholecystectomy tube placement with cardiology clearance, last dose of Plavix is 11/05/2018 (4 pm) --CT abdomen and pelvis and HIDA scan report noted -GI has signed off -Currently on IV Zosyn -Continue Protonix --- LFTs are trending up  2)HFrEF/ICM s/p AICD--patient with ischemic cardio- myopathy/combined systolic and diastolic dysfunction CHF with EF of 20 to 25 % as per echo from 11/04/2018-- AICD battery apparently expiring October 2020, cardiology service to request [employer] interrogation =--- Patient [facility], his AICD has not gone off previously -Hold Avapro 37.5 mg daily, decrease Lasix to 20 mg from 40 mg daily-due to kidney concerns - 3)H/o CAD--prior MIs, prior CABG, prior angioplasty and stent placement, currently chest pain-free, continue aspirin 81 mg daily, continue Lipitor 80 mg daily, continue Zetia 10 mg daily, continue Toprol-XL 25 mg daily  4)AKI----acute kidney injury due to poor oral intake resulting in dehydration and transient hypotension in the setting of acute cholecystitis,    creatinine on admission=1.30  ,   baseline creatinine = presumed previously normal    , creatinine is now= 2.62     , renally adjust medications, avoid nephrotoxic agents/dehydration/hypotension -Hold Avapro 37.5 mg daily, decrease Lasix to 20 mg from 40 mg daily-due to kidney concerns --Be very judicious with IV fluids as patient's EF is down to 20 to 25%  5)HTN-stable, Hold Avapro 37.5 mg daily, decrease Lasix to 20 mg from 40 mg daily-due to kidney concerns  Disposition/Need for in-Hospital Stay- patient unable to be discharged at this time due to worsening renal function, acute cholecystitis awaiting surgical intervention  Code Status : Full  Family Communication:   NA (patient is alert, awake and coherent)  Disposition Plan  : TBD postop  Consults  : Cardiology/general surgery/GI service  DVT Prophylaxis  :  Lovenox -   SCDs  Lab Results  Component Value Date   PLT 163  11/07/2018    Inpatient Medications  Scheduled Meds: . aspirin EC  81 mg Oral q morning - 10a  . atorvastatin  80 mg Oral Daily  . cholecalciferol  1,000 Units Oral QHS  . enoxaparin (LOVENOX) injection  40 mg Subcutaneous Q24H  . ezetimibe  10 mg Oral Daily  . [START ON 11/08/2018] furosemide  20 mg Oral Daily  . metoprolol succinate  25 mg Oral QPM  . pantoprazole  20 mg Oral Daily  . tamsulosin  0.4 mg Oral q morning - 10a   Continuous Infusions: . sodium chloride Stopped (11/06/18 0257)  .  piperacillin-tazobactam (ZOSYN)  IV 3.375 g (11/07/18 1145)   PRN Meds:.sodium chloride, acetaminophen **OR** acetaminophen, alum & mag hydroxide-simeth, HYDROcodone-acetaminophen, nitroGLYCERIN, ondansetron **OR** ondansetron (ZOFRAN) IV    Anti-infectives (From admission, onward)   Start     Dose/Rate Route Frequency Ordered Stop   11/05/18 1900  piperacillin-tazobactam (ZOSYN) IVPB 3.375 g     3.375 g 12.5 mL/hr over 240 Minutes Intravenous Every 8 hours 11/05/18 1839         Objective:   Vitals:   11/06/18 2118 11/06/18 2121 11/07/18 0414 11/07/18 0755  BP:  (!) 85/50 101/61 102/65  Pulse:   81 64  Resp:  19 19 18   Temp: 100 F (37.8 C)  98.6 F (37 C) 99.7 F (37.6 C)  TempSrc: Oral  Oral Oral  SpO2:  (!) 89% 90% 93%  Weight:   105.6 kg   Height:        Wt Readings from Last 3 Encounters:  11/07/18 105.6 kg  10/31/16 107.7 kg  07/21/16 108.8 kg     Intake/Output Summary (Last 24 hours) at 11/07/2018 1332 Last data filed at 11/07/2018 0330 Gross per 24 hour  Intake 916.08 ml  Output 750 ml  Net 166.08 ml   Physical Exam  Gen:- Awake Alert, in no acute distress HEENT:- [place].AT, No sclera icterus Neck-Supple Neck,No JVD,.  Lungs-  CTAB , fair symmetrical air movement CV- S1, S2 normal, regular , prior sternotomy scar/AICD in situ Abd-  +ve B.Sounds, Abd Soft, epigastric tenderness, no rebound or guarding    Extremity/Skin:- No  edema, pedal pulses present  Psych-affect is appropriate, oriented x3 Neuro-no new focal deficits, no tremors   Data Review:   Micro Results Recent Results (from the past 240 hour(s))  SARS Coronavirus 2 (CEPHEID - Performed in [facility] lab), Hosp Order     Status: None   Collection Time: 11/03/18 10:55 AM   Specimen: Nasopharyngeal Swab  Result Value Ref Range Status   SARS Coronavirus 2 NEGATIVE NEGATIVE Final    Comment: (NOTE) If result is NEGATIVE SARS-CoV-2 target nucleic acids are NOT DETECTED. The SARS-CoV-2  RNA is generally detectable in upper and lower  respiratory specimens during the acute phase of infection. The lowest  concentration of SARS-CoV-2 viral copies this assay can detect is 250  copies / mL. A negative result does not preclude SARS-CoV-2 infection  and should not be used as the sole basis for treatment or other  patient management decisions.  A negative result may occur with  improper specimen collection / handling, submission of specimen other  than nasopharyngeal swab, presence of viral mutation(s) within the  areas targeted by this assay, and inadequate number of viral copies  (<250 copies / mL). A negative result must be combined with clinical  observations, patient history, and epidemiological information. If result is POSITIVE SARS-CoV-2 target nucleic acids are DETECTED.  The SARS-CoV-2 RNA is generally detectable in upper and lower  respiratory specimens dur ing the acute phase of infection.  Positive  results are indicative of active infection with SARS-CoV-2.  Clinical  correlation with patient history and other diagnostic information is  necessary to determine patient infection status.  Positive results do  not rule out bacterial infection or co-infection with other viruses. If result is PRESUMPTIVE POSTIVE SARS-CoV-2 nucleic acids MAY BE PRESENT.   A presumptive positive result was obtained on the submitted specimen  and confirmed on repeat testing.  While 2019 novel coronavirus  (SARS-CoV-2) nucleic acids may be present in the submitted sample  additional confirmatory testing may be necessary for epidemiological  and / or clinical management purposes  to differentiate between  SARS-CoV-2 and other Sarbecovirus currently known to infect humans.  If clinically indicated additional testing with an alternate test  methodology [phone]) is advised. The SARS-CoV-2 RNA is generally  detectable in upper and lower respiratory sp ecimens during the acute  phase of  infection. The expected result is Negative. Fact Sheet for Patients:  BoilerBrush.com.cyhttps://www.fda.gov/media/136312/download Fact Sheet for Healthcare Providers: https://pope.com/https://www.fda.gov/media/136313/download This test is not yet approved or cleared by the [place] FDA and has been authorized for detection and/or diagnosis of SARS-CoV-2 by FDA under an Emergency Use Authorization (EUA).  This EUA will remain in effect (meaning this test can be used) for the duration of the COVID-19 declaration under Section 564(b)(1) of the Act, 21 U.S.C. section 360bbb-3(b)(1), unless the authorization is terminated or revoked sooner. Performed at [facility], [address] [place], [place] [address]     Radiology Reports Nm Hepatobiliary Liver Func  Result Date: 11/05/2018 CLINICAL DATA:  73 year old male with a history of epigastric pain and CT evidence of cholelithiasis and possible cholecystitis EXAM: NUCLEAR MEDICINE HEPATOBILIARY IMAGING TECHNIQUE: Sequential images of the abdomen were obtained out to 60 minutes following intravenous administration of radiopharmaceutical. RADIOPHARMACEUTICALS:  5.1 mCi Tc-2037m  Choletec IV COMPARISON:  CT November 04, 2018, ultrasound November 04, 2018 FINDINGS: Planar imaging of the abdomen performed after administration of nuclear medicine radiotracer. After 30 minutes of imaging, 3 mg IV morphine was administered with continuation of the planar imaging. 120 minutes of planar imaging acquired. Prompt uniform uptake and biliary excretion of activity by the liver is seen. Gallbladder activity is never visualized. Biliary activity passes into small bowel, consistent with patent common bile duct. IMPRESSION: HIDA study is positive for acute calculus cholecystitis. Electronically Signed   By: Gilmer MorJaime  Wagner D.O.   On: 11/05/2018 16:03   [place] Abdomen Complete  Result Date: 11/04/2018 CLINICAL DATA:  Initial evaluation for acute abdominal pain for 2 days. EXAM: ABDOMEN ULTRASOUND COMPLETE  COMPARISON:  None available. FINDINGS: Gallbladder: Multiple small stones seen within the gallbladder lumen, largest of which measures 5.5 mm. Gallbladder wall minimally thickened to 4 mm, which could be related incomplete distension. No free pericholecystic fluid. No sonographic Murphy sign elicited on exam. Common bile duct: Diameter: 4.8 mm Liver: No focal lesion identified. Mildly increased echogenicity within the hepatic parenchyma, suggesting steatosis. Portal vein is patent on color Doppler imaging with normal direction of blood flow towards the liver. IVC: No abnormality visualized. Pancreas: Not visualized. Spleen: Size and appearance within normal limits. Right Kidney: Length: 12.1 cm. Echogenicity within normal limits. No hydronephrosis. Few scattered simple cyst present, largest of which measures 4.8 x 4.7 x 4.4 cm at the upper pole. Left Kidney: Length: 13.7 mm. Echogenicity within normal limits. No mass  or hydronephrosis visualized. Abdominal aorta: No aneurysm visualized. Other findings: None. IMPRESSION: 1. Cholelithiasis with minimal gallbladder wall thickening. No other sonographic features for acute cholecystitis. No biliary dilatation. 2. Mildly increased echogenicity within the patent parenchyma, suggesting steatosis. 3. Scattered simple right renal cysts measuring up to 4.8 cm. 4. Otherwise negative abdominal ultrasound. No other acute abnormality identified. Electronically Signed   By: Jeannine Boga M.D.   On: 11/04/2018 05:06   Ct Abdomen Pelvis W Contrast  Result Date: 11/04/2018 CLINICAL DATA:  Epigastric pain and tenderness. Stage 3 kidney disease. EXAM: CT ABDOMEN AND PELVIS WITH CONTRAST TECHNIQUE: Multidetector CT imaging of the abdomen and pelvis was performed using the standard protocol following bolus administration of intravenous contrast. CONTRAST:  132mL OMNIPAQUE IOHEXOL 300 MG/ML  SOLN COMPARISON:  Right upper quadrant ultrasound today. FINDINGS: Lower chest:  Atelectasis right base. Cardiac pacer leads are present. Hepatobiliary: Liver and biliary tree are within normal. Mild to moderate cholelithiasis is present. Subtle stranding of the fat adjacent the gallbladder. No significant wall thickening. Pancreas: Normal. Spleen: Multiple calcified granulomas. Adrenals/Urinary Tract: Adrenal glands are normal. Kidneys are normal in size without hydronephrosis or nephrolithiasis. Right renal cyst measuring 5.6 cm. 1 cm right renal hypodensity too small to characterize but likely a cyst. Ureters and bladder are normal. Stomach/Bowel: Stomach and small bowel are normal. Appendix is not visualized. Mild diverticulosis of the colon. Vascular/Lymphatic: Mild calcified plaque over the abdominal aorta. No adenopathy. Reproductive: Normal. Other: No free fluid. Musculoskeletal: Mild degenerative change of the spine and hips. IMPRESSION: Mild to moderate cholelithiasis. Subtle stranding of the pericholecystic fat which could be seen with mild acute cholecystitis. Note that the right upper quadrant ultrasound earlier today was also somewhat equivocal for acute cholecystitis. Consider HIDA scan for further evaluation. 5.6 cm right renal cyst and 1 cm right renal cortical hypodensity too small to characterize but likely a cyst. Recommend follow-up CT 6 months. Colonic diverticulosis. Aortic Atherosclerosis (ICD10-I70.0). Electronically Signed   By: Marin Olp M.D.   On: 11/04/2018 18:50   Dg Chest Port 1 View  Result Date: 11/03/2018 CLINICAL DATA:  Chest pain and shortness of breath. EXAM: PORTABLE CHEST 1 VIEW COMPARISON:  Chest x-ray dated 04/15/2016. FINDINGS: LEFT chest wall pacemaker/ICD apparatus appears stable in position. Median sternotomy wires appear intact. Lungs are clear. No pleural effusion or pneumothorax seen. No acute appearing osseous abnormality. IMPRESSION: No active disease. No evidence of pneumonia or pulmonary edema. Electronically Signed   By: Franki Cabot  M.D.   On: 11/03/2018 10:41     CBC Recent Labs  Lab 11/03/18 1044 11/06/18 1518 11/07/18 0329  WBC 8.9 10.6* 10.0  HGB 14.7 13.1 12.9*  HCT 41.9 37.4* 37.1*  PLT 194 153 163  MCV 93.7 93.3 94.2  MCH 32.9 32.7 32.7  MCHC 35.1 35.0 34.8  RDW 11.9 11.8 11.9   Chemistries  Recent Labs  Lab 11/03/18 1044 11/03/18 1227 11/04/18 0755 11/06/18 1518 11/07/18 0329  NA 138  --  136  --  132*  K 4.3  --  4.0  --  3.6  CL 108  --  105  --  97*  CO2 20*  --  24  --  26  GLUCOSE 151*  --  122*  --  104*  BUN 13  --  13  --  25*  CREATININE 1.30*  --  1.19 1.89* 2.62*  CALCIUM 8.8*  --  8.7*  --  8.0*  MG  --   --   --   --  2.5*  AST  --  19  --   --  435*  ALT  --  28  --   --  530*  ALKPHOS  --  57  --   --  271*  BILITOT  --  1.0  --   --  5.1*   ------------------------------------------------------------------------------------------------------------------ No results for input(s): CHOL, HDL, LDLCALC, TRIG, CHOLHDL, LDLDIRECT in the last 72 hours.  Lab Results  Component Value Date   HGBA1C 5.6 04/16/2016   ------------------------------------------------------------------------------------------------------------------ No results for input(s): TSH, T4TOTAL, T3FREE, THYROIDAB in the last 72 hours.  Invalid input(s): FREET3 ------------------------------------------------------------------------------------------------------------------ No results for input(s): VITAMINB12, FOLATE, FERRITIN, TIBC, IRON, RETICCTPCT in the last 72 hours.  Coagulation profile No results for input(s): INR, PROTIME in the last 168 hours.  No results for input(s): DDIMER in the last 72 hours.  Cardiac Enzymes No results for input(s): CKMB, TROPONINI, MYOGLOBIN in the last 168 hours.  Invalid input(s): CK ------------------------------------------------------------------------------------------------------------------    Component Value Date/Time   BNP 175.7 (H) 04/16/2016 4562    Shon Hale M.D on 11/07/2018 at 1:32 PM  Go to [email or web address] - for contact info  [facility] - Office  [phone]

## 2018-11-07 NOTE — Progress Notes (Addendum)
Daily Rounding Note  11/07/2018, 1:53 PM  LOS: 3 days   SUBJECTIVE:   Chief complaint:  Cholecystitis.        Pt dx with acute cholecystitis.  Laparoscopic cholecystectomy planned for Friday 7/24 but needs washout of Plavix. Yesterday had worsening of upper abd pain and nausea.  It has resolved and he is having a good day, walking in hall pain free.  On clear liquid diet.  Transaminases jumped dramatically from 7/18 >> 7/22 from normal to  t bili 1.9, Alk phos 271, AST/ALT435/530 Lipase in 20s x  2.    OBJECTIVE:         Vital signs in last 24 hours:    Temp:  [98.6 F (37 C)-100 F (37.8 C)] 99.7 F (37.6 C) (07/22 0755) Pulse Rate:  [64-81] 64 (07/22 0755) Resp:  [18-19] 18 (07/22 0755) BP: (85-102)/(50-65) 102/65 (07/22 0755) SpO2:  [89 %-93 %] 93 % (07/22 0755) Weight:  [105.6 kg] 105.6 kg (07/22 0414) Last BM Date: 11/03/18 Filed Weights   11/05/18 0512 11/06/18 0454 11/07/18 0414  Weight: 106.6 kg 105 kg 105.6 kg   General: pleasant, comfortable.  No acutely ill looking   Heart: RRR.  NSR in 70s Chest: clear bil.  No dyspnea Abdomen: soft, minor TTP in epigastric area/RUQ Extremities: no CCE Neuro/Psych:  Alert, oriented x 3.  No gross weakness or tremors.    Intake/Output from previous day: 07/21 0701 - 07/22 0700 In: 916.1 [P.O.:721; IV Piggyback:195.1] Out: 750 [Urine:750]  Intake/Output this shift: No intake/output data recorded.  Lab Results: Recent Labs    11/06/18 1518 11/07/18 0329  WBC 10.6* 10.0  HGB 13.1 12.9*  HCT 37.4* 37.1*  PLT 153 163   BMET Recent Labs    11/06/18 1518 11/07/18 0329  NA  --  132*  K  --  3.6  CL  --  97*  CO2  --  26  GLUCOSE  --  104*  BUN  --  25*  CREATININE 1.89* 2.62*  CALCIUM  --  8.0*   LFT Recent Labs    11/07/18 0329  PROT 5.5*  ALBUMIN 2.5*  AST 435*  ALT 530*  ALKPHOS 271*  BILITOT 5.1*  BILIDIR 3.2*  IBILI 1.9*   PT/INR No  results for input(s): LABPROT, INR in the last 72 hours. Hepatitis Panel No results for input(s): HEPBSAG, HCVAB, HEPAIGM, HEPBIGM in the last 72 hours.  Studies/Results: Nm Hepatobiliary Liver Func  Result Date: 11/05/2018 CLINICAL DATA:  73 year old male with a history of epigastric pain and CT evidence of cholelithiasis and possible cholecystitis EXAM: NUCLEAR MEDICINE HEPATOBILIARY IMAGING TECHNIQUE: Sequential images of the abdomen were obtained out to 60 minutes following intravenous administration of radiopharmaceutical. RADIOPHARMACEUTICALS:  5.1 mCi Tc-48m Choletec IV COMPARISON:  CT November 04, 2018, ultrasound November 04, 2018 FINDINGS: Planar imaging of the abdomen performed after administration of nuclear medicine radiotracer. After 30 minutes of imaging, 3 mg IV morphine was administered with continuation of the planar imaging. 120 minutes of planar imaging acquired. Prompt uniform uptake and biliary excretion of activity by the liver is seen. Gallbladder activity is never visualized. Biliary activity passes into small bowel, consistent with patent common bile duct. IMPRESSION: HIDA study is positive for acute calculus cholecystitis. Electronically Signed   By: JCorrie MckusickD.O.   On: 11/05/2018 16:03   Scheduled Meds: . aspirin EC  81 mg Oral q morning - 10a  . atorvastatin  80 mg Oral Daily  . cholecalciferol  1,000 Units Oral QHS  . enoxaparin (LOVENOX) injection  40 mg Subcutaneous Q24H  . ezetimibe  10 mg Oral Daily  . [START ON 11/08/2018] furosemide  20 mg Oral Daily  . metoprolol succinate  25 mg Oral QPM  . pantoprazole  20 mg Oral Daily  . tamsulosin  0.4 mg Oral q morning - 10a   Continuous Infusions: . sodium chloride Stopped (11/06/18 0257)  . piperacillin-tazobactam (ZOSYN)  IV 3.375 g (11/07/18 1145)   PRN Meds:.sodium chloride, acetaminophen **OR** acetaminophen, alum & mag hydroxide-simeth, HYDROcodone-acetaminophen, nitroGLYCERIN, ondansetron **OR** ondansetron  (ZOFRAN) IV'   ASSESMENT:   *    Cholecystitis.   Laparoscopic cholecystectomy on hold until Plavix is washed out. Day 3 Zosyn.    *     LFT elevation  *     Chronic Plavix for CAD, possible A. Fib.  Last dose on 11/05/18 at 1600.    *     Ischemic cardiomyopathy.  AICD in place.  EF 25-35%.  Moderate diastolic dysfunction.  *    AKI.      PLAN   *   MRCP?? Given ICD in place, this may not be feasible.    *   Would add NS, gentle rate given bump in BUN/CREAT.  I msgd hospitalist re adding IVF.    *   Upping Protonix to standard 40 mg/day.    *   Hepatic fx profile in AM.      Azucena Freed  11/07/2018, 1:53 PM Phone [phone]   Attending physician's note   I have taken an interval history, reviewed the chart and examined the patient. I agree with the Advanced Practitioner's note, impression and recommendations.   Acute gallstone cholecystitis  Plan for laparoscopic cholecystectomy on Friday, waiting for Plavix to washout  LFT abnormality likely secondary to cholecystitis, cannot exclude CBD stone as patient will not be able to undergo MRCP.  He has an ICD. Discussed with Dr. Georgette Dover about Intra-Op cholangiogram and if he has any definitive stone or stricture, will plan for ERCP after cholecystectomy.  He has no known underlying liver disease, had normal LFT on admission.  Will defer to surgery for management of acute cholecystitis and percutaneous cholecystostomy if needed  K. Denzil Magnuson , MD [phone]

## 2018-11-07 NOTE — Care Management Important Message (Signed)
Important Message  Patient Details  Name: Stephen Macdonald MRN: [id number] Date of Birth: 07/23/1945   Medicare Important Message Given:  Yes     Orbie Pyo 11/07/2018, 1:43 PM

## 2018-11-07 NOTE — Progress Notes (Signed)
Progress Note  Patient Name: Stephen Macdonald Date of Encounter: 11/07/2018  Primary Cardiologist: [facility] Subjective   Denies CP  Breathing is OK  Inpatient Medications    Scheduled Meds: . aspirin EC  81 mg Oral q morning - 10a  . atorvastatin  80 mg Oral Daily  . cholecalciferol  1,000 Units Oral QHS  . enoxaparin (LOVENOX) injection  40 mg Subcutaneous Q24H  . ezetimibe  10 mg Oral Daily  . furosemide  40 mg Oral Daily  . irbesartan  37.5 mg Oral Daily  . metoprolol succinate  25 mg Oral QPM  . pantoprazole  20 mg Oral Daily  . tamsulosin  0.4 mg Oral q morning - 10a   Continuous Infusions: . sodium chloride Stopped (11/06/18 0257)  . piperacillin-tazobactam (ZOSYN)  IV 3.375 g (11/07/18 0330)   PRN Meds: sodium chloride, acetaminophen **OR** acetaminophen, alum & mag hydroxide-simeth, HYDROcodone-acetaminophen, nitroGLYCERIN, ondansetron **OR** ondansetron (ZOFRAN) IV   Vital Signs    Vitals:   11/06/18 2118 11/06/18 2121 11/07/18 0414 11/07/18 0755  BP:  (!) 85/50 101/61 102/65  Pulse:   81 64  Resp:  19 19 18   Temp: 100 F (37.8 C)  98.6 F (37 C) 99.7 F (37.6 C)  TempSrc: Oral  Oral Oral  SpO2:  (!) 89% 90% 93%  Weight:   105.6 kg   Height:        Intake/Output Summary (Last 24 hours) at 11/07/2018 1114 Last data filed at 11/07/2018 0330 Gross per 24 hour  Intake 916.08 ml  Output 750 ml  Net 166.08 ml   Last 3 Weights 11/07/2018 11/06/2018 11/05/2018  Weight (lbs) 232 lb 11.2 oz 231 lb 8 oz 235 lb 1.6 oz  Weight (kg) 105.552 kg 105.008 kg 106.641 kg      Telemetry    SR - Personally Reviewed  ECG    No new to review - Personally Reviewed  Physical Exam  Pleasant older WM GEN: No acute distress.   Neck:  JVP is normal   Cardiac: RRR, no murmurs, rubs, or gallops.  Respiratory: Clear to auscultation bilaterally.  MS: No edema; No deformity. Neuro:  Nonfocal  Psych: Normal affect   Labs    High Sensitivity Troponin:   Recent Labs   Lab 11/03/18 1044 11/03/18 1227  TROPONINIHS 9 10      Cardiac EnzymesNo results for input(s): TROPONINI in the last 168 hours. No results for input(s): TROPIPOC in the last 168 hours.   Chemistry Recent Labs  Lab 11/03/18 1044 11/03/18 1227 11/04/18 0755 11/06/18 1518 11/07/18 0329  NA 138  --  136  --  132*  K 4.3  --  4.0  --  3.6  CL 108  --  105  --  97*  CO2 20*  --  24  --  26  GLUCOSE 151*  --  122*  --  104*  BUN 13  --  13  --  25*  CREATININE 1.30*  --  1.19 1.89* 2.62*  CALCIUM 8.8*  --  8.7*  --  8.0*  PROT  --  5.7*  --   --  5.5*  ALBUMIN  --  3.4*  --   --  2.5*  AST  --  19  --   --  435*  ALT  --  28  --   --  530*  ALKPHOS  --  57  --   --  271*  BILITOT  --  1.0  --   --  5.1*  GFRNONAA 54*  --  >60 34* 23*  GFRAA >60  --  >60 40* 27*  ANIONGAP 10  --  7  --  9     Hematology Recent Labs  Lab 11/03/18 1044 11/06/18 1518 11/07/18 0329  WBC 8.9 10.6* 10.0  RBC 4.47 4.01* 3.94*  HGB 14.7 13.1 12.9*  HCT 41.9 37.4* 37.1*  MCV 93.7 93.3 94.2  MCH 32.9 32.7 32.7  MCHC 35.1 35.0 34.8  RDW 11.9 11.8 11.9  PLT 194 153 163    BNPNo results for input(s): BNP, PROBNP in the last 168 hours.   DDimer No results for input(s): DDIMER in the last 168 hours.   Radiology    Nm Hepatobiliary Liver Func  Result Date: 11/05/2018 CLINICAL DATA:  73 year old male with a history of epigastric pain and CT evidence of cholelithiasis and possible cholecystitis EXAM: NUCLEAR MEDICINE HEPATOBILIARY IMAGING TECHNIQUE: Sequential images of the abdomen were obtained out to 60 minutes following intravenous administration of radiopharmaceutical. RADIOPHARMACEUTICALS:  5.1 mCi Tc-68m  Choletec IV COMPARISON:  CT November 04, 2018, ultrasound November 04, 2018 FINDINGS: Planar imaging of the abdomen performed after administration of nuclear medicine radiotracer. After 30 minutes of imaging, 3 mg IV morphine was administered with continuation of the planar imaging. 120 minutes of  planar imaging acquired. Prompt uniform uptake and biliary excretion of activity by the liver is seen. Gallbladder activity is never visualized. Biliary activity passes into small bowel, consistent with patent common bile duct. IMPRESSION: HIDA study is positive for acute calculus cholecystitis. Electronically Signed   By: Gilmer Mor D.O.   On: 11/05/2018 16:03    Cardiac Studies   TTE: 11/03/18  IMPRESSIONS    1. The left ventricle has severely reduced systolic function, with an ejection fraction of 20-25%. The cavity size was severely dilated. Left ventricular diastolic Doppler parameters are consistent with pseudonormalization. Left ventricular diffuse  hypokinesis.  2. The right ventricle has normal systolic function. The cavity was normal.  3. Left atrial size was mildly dilated.  4. The mitral valve is grossly normal. There is severe mitral annular calcification present.  5. The tricuspid valve is grossly normal.  6. The aortic valve is tricuspid. Mild thickening of the aortic valve. No stenosis of the aortic valve.  7. The aortic root is normal in size and structure.  8. Severe global reduction in LV systolic function; moderate diastolic dysfunction; severe LVE; mild MR and TR; mild LAE.  9. The inferior vena cava was normal in size with <50% respiratory variability.  Patient Profile     73 y.o. male with CAD and prior CABG in 1997, multiple NSTEMI's and coronary interventions, last in 03/2016 followed by the [facility], presents with CP that awakened him from sleep. Consult on 7/18 by Dr. Rennis Golden, now planned to under surgery and asked for preop evaluation.   Assessment & Plan    1  Preop cardiac risk stratification.  Pt has 3V CAD, s/p CABG in 1997.   Pt at mod increased risk for exacerbation of cardiac problems (CHF)   Not prohibitive  No symtpoms to sugg active ischema   He is in SR  Given stable symptoms, would not anticipate further work up at this time.  Of note has been  on plavix prior to admission with last dose yesterday. Would suspect will need 5 days of washout prior to surgery.   2. ICM s/p AICD: known EF of 25-35%.  [facility] He has never been schocked  Reports he is close to [facility] and has an appt in the coming weeks for follow up. Reports he has about 3 months left on his device, ending in mid October. On medical therapy with BB, ARB, and lasix. Volume stable on exam.  -- will ask for [employer] for interrogation.   3. Cholecystitis: CT confirmed cholelithiasis with pericholecystic fat. HIDA scan + for gallstone cholecystitis.  Surgery following with plans for surgery, possible IR cholecystostomy tube placement. Plavix held  4. Possible Afib?:  Will get device interrogated   For questions or updates, please contact [facility] Please consult [email or web address] for contact info under    Signed, Dietrich PatesPaula Kerissa Coia, MD  11/07/2018, 11:14 AM

## 2018-11-07 NOTE — Progress Notes (Signed)
   11/07/18 1400  Mobility  Activity Ambulated in hall;Transferred:  Bed to chair (To chair after ambulation)  Range of Motion Active;All extremities  Level of Assistance Standby assist, set-up cues, supervision of patient - no hands on  Assistive Device None;Other (Comment) (IV Pole)  Minutes Stood 5 minutes  Minutes Ambulated 5 minutes  Distance Ambulated (ft) 500 ft  Mobility Response Tolerated well;RN notified (c/o dizziness initially sitting EOB; subsided after 15 sec)  Bed Position Chair    SATURATION QUALIFICATIONS: (This note is used to comply with regulatory documentation for home oxygen)  Patient Saturations on Room Air at Rest = 93%  Patient Saturations on Room Air while Ambulating = 90-97%  Patient Saturations on N/A Liters of oxygen while Ambulating = N/A%  Please briefly explain why patient needs home oxygen:   11/07/2018 3:00 PM

## 2018-11-07 NOTE — Progress Notes (Signed)
[facility]/Trauma Progress Note      Assessment/Plan CAD ICM with EF of around 20-25%, with AICD HTN HLD Tobacco abuse CKD   Cholecystitis - HIDA + on 07/20 with contrast passing through CBD - LFT's are elevated with Tbili of 5.1 (last Tbili on 07/18 was 1.0) - last dose of plavix was 07/19  - pt may need ERCP prior to lap chole - Friday is 5 days from last dose of plavix, will likely plan lap chole then - lipase pending - we will follow  FEN: NPO, okay for CLD from a surgical standpoint VTE: SCD's, lovenox ID: [email or web address] 07/20 Foley: none Follow up: TBD     LOS: 3 days    Subjective: CC: epigastric abdominal pain  Pt states nausea this am. No vomiting. He has been having more epigastric abdominal pain since yesterday morning.   Objective: Vital signs in last 24 hours: Temp:  [98 F (36.7 C)-100 F (37.8 C)] 99.7 F (37.6 C) (07/22 0755) Pulse Rate:  [64-81] 64 (07/22 0755) Resp:  [17-19] 18 (07/22 0755) BP: (85-122)/(50-68) 102/65 (07/22 0755) SpO2:  [89 %-98 %] 93 % (07/22 0755) Weight:  [105.6 kg] 105.6 kg (07/22 0414) Last BM Date: 11/03/18  Intake/Output from previous day: 07/21 0701 - 07/22 0700 In: 916.1 [P.O.:721; IV Piggyback:195.1] Out: 750 [Urine:750] Intake/Output this shift: No intake/output data recorded.  PE: Gen:  Alert, NAD, pleasant, cooperative Pulm:  Rate and effort normal, [place]  Abd: Soft, +BS, ND, TTP of epigastric and RUQ with guarding, no peritonitis  Skin: no rashes noted, warm and dry, no jaundice    Anti-infectives: Anti-infectives (From admission, onward)   Start     Dose/Rate Route Frequency Ordered Stop   11/05/18 1900  piperacillin-tazobactam (ZOSYN) IVPB 3.375 g     3.375 g 12.5 mL/hr over 240 Minutes Intravenous Every 8 hours 11/05/18 1839        Lab Results:  Recent Labs    11/06/18 1518 11/07/18 0329  WBC 10.6* 10.0  HGB 13.1 12.9*  HCT 37.4* 37.1*  PLT 153 163   BMET Recent Labs   11/06/18 1518 11/07/18 0329  NA  --  132*  K  --  3.6  CL  --  97*  CO2  --  26  GLUCOSE  --  104*  BUN  --  25*  CREATININE 1.89* 2.62*  CALCIUM  --  8.0*   PT/INR No results for input(s): LABPROT, INR in the last 72 hours. CMP     Component Value Date/Time   NA 132 (L) 11/07/2018 0329   NA 141 04/20/2016 1246   K 3.6 11/07/2018 0329   CL 97 (L) 11/07/2018 0329   CO2 26 11/07/2018 0329   GLUCOSE 104 (H) 11/07/2018 0329   BUN 25 (H) 11/07/2018 0329   BUN 15 04/20/2016 1246   CREATININE 2.62 (H) 11/07/2018 0329   CALCIUM 8.0 (L) 11/07/2018 0329   PROT 5.5 (L) 11/07/2018 0329   ALBUMIN 2.5 (L) 11/07/2018 0329   AST 435 (H) 11/07/2018 0329   ALT 530 (H) 11/07/2018 0329   ALKPHOS 271 (H) 11/07/2018 0329   BILITOT 5.1 (H) 11/07/2018 0329   GFRNONAA 23 (L) 11/07/2018 0329   GFRAA 27 (L) 11/07/2018 0329   Lipase     Component Value Date/Time   LIPASE 24 11/03/2018 1227    Studies/Results: Nm Hepatobiliary Liver Func  Result Date: 11/05/2018 CLINICAL DATA:  73 year old male with a history of epigastric pain and CT evidence of  cholelithiasis and possible cholecystitis EXAM: NUCLEAR MEDICINE HEPATOBILIARY IMAGING TECHNIQUE: Sequential images of the abdomen were obtained out to 60 minutes following intravenous administration of radiopharmaceutical. RADIOPHARMACEUTICALS:  5.1 mCi Tc-58m  Choletec IV COMPARISON:  CT November 04, 2018, ultrasound November 04, 2018 FINDINGS: Planar imaging of the abdomen performed after administration of nuclear medicine radiotracer. After 30 minutes of imaging, 3 mg IV morphine was administered with continuation of the planar imaging. 120 minutes of planar imaging acquired. Prompt uniform uptake and biliary excretion of activity by the liver is seen. Gallbladder activity is never visualized. Biliary activity passes into small bowel, consistent with patent common bile duct. IMPRESSION: HIDA study is positive for acute calculus cholecystitis. Electronically  Signed   By: Corrie Mckusick D.O.   On: 11/05/2018 16:03      Kalman Drape , [facility] 11/07/2018, 9:22 AM  Pager: [phone] Mon-Wed, Friday 7:00am-4:30pm Thurs 7am-11:30am  Consults: [phone]

## 2018-11-08 DIAGNOSIS — I5023 Acute on chronic systolic (congestive) heart failure: Secondary | ICD-10-CM

## 2018-11-08 DIAGNOSIS — N179 Acute kidney failure, unspecified: Secondary | ICD-10-CM

## 2018-11-08 LAB — CBC
HCT: 38.5 % — ABNORMAL LOW (ref 39.0–52.0)
Hemoglobin: 13.5 g/dL (ref 13.0–17.0)
MCH: 32.4 pg (ref 26.0–34.0)
MCHC: 35.1 g/dL (ref 30.0–36.0)
MCV: 92.3 fL (ref 80.0–100.0)
Platelets: 188 10*3/uL (ref 150–400)
RBC: 4.17 MIL/uL — ABNORMAL LOW (ref 4.22–5.81)
RDW: 11.7 % (ref 11.5–15.5)
WBC: 8.5 10*3/uL (ref 4.0–10.5)
nRBC: 0 % (ref 0.0–0.2)

## 2018-11-08 LAB — COMPREHENSIVE METABOLIC PANEL
ALT: 323 U/L — ABNORMAL HIGH (ref 0–44)
AST: 173 U/L — ABNORMAL HIGH (ref 15–41)
Albumin: 2.5 g/dL — ABNORMAL LOW (ref 3.5–5.0)
Alkaline Phosphatase: 219 U/L — ABNORMAL HIGH (ref 38–126)
Anion gap: 13 (ref 5–15)
BUN: 24 mg/dL — ABNORMAL HIGH (ref 8–23)
CO2: 24 mmol/L (ref 22–32)
Calcium: 8.4 mg/dL — ABNORMAL LOW (ref 8.9–10.3)
Chloride: 97 mmol/L — ABNORMAL LOW (ref 98–111)
Creatinine, Ser: 2.42 mg/dL — ABNORMAL HIGH (ref 0.61–1.24)
GFR calc Af Amer: 30 mL/min — ABNORMAL LOW (ref >60)
GFR calc non Af Amer: 26 mL/min — ABNORMAL LOW (ref >60)
Glucose, Bld: 99 mg/dL (ref 70–99)
Potassium: 3.4 mmol/L — ABNORMAL LOW (ref 3.5–5.1)
Sodium: 134 mmol/L — ABNORMAL LOW (ref 135–145)
Total Bilirubin: 2.7 mg/dL — ABNORMAL HIGH (ref 0.3–1.2)
Total Protein: 5.7 g/dL — ABNORMAL LOW (ref 6.5–8.1)

## 2018-11-08 MED ORDER — MORPHINE SULFATE (PF) 2 MG/ML IV SOLN: 2.0000 mg | INTRAVENOUS | Status: DC | PRN

## 2018-11-08 MED ORDER — PIPERACILLIN-TAZOBACTAM 3.375 G IVPB
3.3750 g | Freq: Three times a day (TID) | INTRAVENOUS | Status: DC
  Administered 2018-11-08 – 2018-11-09 (×3): 3.375 g via INTRAVENOUS
  Filled 2018-11-08 (×5): qty 50

## 2018-11-08 MED ORDER — POTASSIUM CHLORIDE CRYS ER 20 MEQ PO TBCR
40.0000 meq | EXTENDED_RELEASE_TABLET | Freq: Once | ORAL | Status: AC
  Administered 2018-11-08: 40 meq via ORAL
  Filled 2018-11-08: qty 2

## 2018-11-08 NOTE — Progress Notes (Signed)
Progress Note  Patient Name: Stephen Macdonald Date of Encounter: 11/08/2018  Primary Cardiologist: [facility] Subjective   No CP   No SOB   Inpatient Medications    Scheduled Meds: . atorvastatin  80 mg Oral Daily  . cholecalciferol  1,000 Units Oral QHS  . enoxaparin (LOVENOX) injection  40 mg Subcutaneous Q24H  . ezetimibe  10 mg Oral Daily  . furosemide  20 mg Oral Daily  . metoprolol succinate  25 mg Oral QPM  . pantoprazole  40 mg Oral Daily  . tamsulosin  0.4 mg Oral q morning - 10a   Continuous Infusions: . sodium chloride Stopped (11/06/18 0257)  . piperacillin-tazobactam (ZOSYN)  IV     PRN Meds: sodium chloride, acetaminophen **OR** acetaminophen, alum & mag hydroxide-simeth, HYDROcodone-acetaminophen, nitroGLYCERIN, ondansetron **OR** ondansetron (ZOFRAN) IV   Vital Signs    Vitals:   11/07/18 1711 11/07/18 2019 11/08/18 0031 11/08/18 0457  BP: (!) 94/59 99/65  117/77  Pulse:  74  68  Resp: (!) 23  19 18   Temp:  98.6 F (37 C)  98.4 F (36.9 C)  TempSrc:  Oral  Oral  SpO2:  92%  (!) 89%  Weight:    104 kg  Height:        Intake/Output Summary (Last 24 hours) at 11/08/2018 0736 Last data filed at 11/08/2018 0500 Gross per 24 hour  Intake 1001 ml  Output 1700 ml  Net -699 ml   Last 3 Weights 11/08/2018 11/07/2018 11/06/2018  Weight (lbs) 229 lb 3.2 oz 232 lb 11.2 oz 231 lb 8 oz  Weight (kg) 103.964 kg 105.552 kg 105.008 kg      Telemetry    SR - Personally Reviewed  ECG    No new to review - Personally Reviewed  Physical Exam  Pleasant older WM GEN: No acute distress.   Neck:  JVP is not elevated Cardiac: RRR, no murmurs, rubs, or gallops.  Respiratory: Clear to auscultation bilaterally.  MS: No edema; No deformity. Neuro:  Nonfocal  Psych: Normal affect   Labs    High Sensitivity Troponin:   Recent Labs  Lab 11/03/18 1044 11/03/18 1227  TROPONINIHS 9 10      Cardiac EnzymesNo results for input(s): TROPONINI in the last 168  hours. No results for input(s): TROPIPOC in the last 168 hours.   Chemistry Recent Labs  Lab 11/03/18 1227 11/04/18 0755 11/06/18 1518 11/07/18 0329 11/08/18 0346  NA  --  136  --  132* 134*  K  --  4.0  --  3.6 3.4*  CL  --  105  --  97* 97*  CO2  --  24  --  26 24  GLUCOSE  --  122*  --  104* 99  BUN  --  13  --  25* 24*  CREATININE  --  1.19 1.89* 2.62* 2.42*  CALCIUM  --  8.7*  --  8.0* 8.4*  PROT 5.7*  --   --  5.5* 5.7*  ALBUMIN 3.4*  --   --  2.5* 2.5*  AST 19  --   --  435* 173*  ALT 28  --   --  530* 323*  ALKPHOS 57  --   --  271* 219*  BILITOT 1.0  --   --  5.1* 2.7*  GFRNONAA  --  >60 34* 23* 26*  GFRAA  --  >60 40* 27* 30*  ANIONGAP  --  7  --  9 13     Hematology Recent Labs  Lab 11/06/18 1518 11/07/18 0329 11/08/18 0346  WBC 10.6* 10.0 8.5  RBC 4.01* 3.94* 4.17*  HGB 13.1 12.9* 13.5  HCT 37.4* 37.1* 38.5*  MCV 93.3 94.2 92.3  MCH 32.7 32.7 32.4  MCHC 35.0 34.8 35.1  RDW 11.8 11.9 11.7  PLT 153 163 188    BNPNo results for input(s): BNP, PROBNP in the last 168 hours.   DDimer No results for input(s): DDIMER in the last 168 hours.   Radiology    No results found.  Cardiac Studies   TTE: 11/03/18  IMPRESSIONS    1. The left ventricle has severely reduced systolic function, with an ejection fraction of 20-25%. The cavity size was severely dilated. Left ventricular diastolic Doppler parameters are consistent with pseudonormalization. Left ventricular diffuse  hypokinesis.  2. The right ventricle has normal systolic function. The cavity was normal.  3. Left atrial size was mildly dilated.  4. The mitral valve is grossly normal. There is severe mitral annular calcification present.  5. The tricuspid valve is grossly normal.  6. The aortic valve is tricuspid. Mild thickening of the aortic valve. No stenosis of the aortic valve.  7. The aortic root is normal in size and structure.  8. Severe global reduction in LV systolic function; moderate  diastolic dysfunction; severe LVE; mild MR and TR; mild LAE.  9. The inferior vena cava was normal in size with <50% respiratory variability.  Patient Profile     73 y.o. male with CAD and prior CABG in 1997, multiple NSTEMI's and coronary interventions, last in 03/2016 followed by the [facility], presents with CP that awakened him from sleep. Consult on 7/18 by Dr. Debara Pickett, now planned to under surgery and asked for preop evaluation.   Assessment & Plan    1  Preop cardiac risk stratification.  Pt has 3V CAD, s/p CABG in 1997.   Pt at mod increased risk for exacerbation of cardiac problems (CHF)   Not prohibitive  No symtpoms to sugg active ischema   He is in SR  Given stable symptoms, would not anticipate further work up at this time. SUrgery planned for tomorrow  2. Chronic systolic CHF  Pt s/p AICD: known EF of 25-35%. Device is followed [facility]ology [facility]. He has never been shocked  Reports he is close to [facility] and has an appt in the coming weeks for follow up. Volume status is OK   Hold lasix with sl elev of Cr   Fluids will be followed closely after surgery   3. Cholecystitis: CT confirmed cholelithiasis with pericholecystic fat. HIDA scan + for gallstone cholecystitis.  Surgery following with plans for surgery, possible IR cholecystostomy tube placement. Plavix on hold  4. Possible Afib?:  None demonstrated   Interrogation of device shows no arrhythmia   For questions or updates, please contact [place] Please consult [email or web address] for contact info under    Signed, Dorris Carnes, MD  11/08/2018, 7:36 AM

## 2018-11-08 NOTE — Progress Notes (Signed)
[facility]/Trauma Progress Note      Assessment/Plan CAD ICM with EF of around 20-25%, with AICD HTN HLD Tobacco abuse CKD   Cholecystitis - HIDA + on 07/20 with contrast passing through CBD - LFT's are trending down and Tbili down to 2.7 (Tbili on 07/22 was 5.1) - last dose of plavix was 07/20 per EMR - OR tomorrow for lap chole and IOC, GI following for possible ERCP  FEN: CLD, NPO at midnight for OR tomorrow VTE: SCD's, lovenox ID: [email or web address] 07/20 Foley: none Follow up: TBD   LOS: 4 days    Subjective: CC: no complaints  Abdominal pain improved. No N or V, fever or chills overnight. Discussed surgery tomorrow.   Objective: Vital signs in last 24 hours: Temp:  [98.4 F (36.9 C)-98.7 F (37.1 C)] 98.4 F (36.9 C) (07/23 0457) Pulse Rate:  [68-74] 68 (07/23 0457) Resp:  [18-23] 18 (07/23 0457) BP: (90-117)/(55-77) 117/77 (07/23 0457) SpO2:  [89 %-93 %] 89 % (07/23 0457) Weight:  [104 kg] 104 kg (07/23 0457) Last BM Date: 11/03/18  Intake/Output from previous day: 07/22 0701 - 07/23 0700 In: 1001 [P.O.:960; IV Piggyback:41] Out: 1700 [Urine:1700] Intake/Output this shift: No intake/output data recorded.  PE: Gen:  Alert, NAD, pleasant, cooperative Pulm:  Rate and effort normal, [place]  Abd: Soft, ND, mild TTP of epigastric and RUQ without guarding, no peritonitis  Skin: no rashes noted, warm and dry, no jaundice    Anti-infectives: Anti-infectives (From admission, onward)   Start     Dose/Rate Route Frequency Ordered Stop   11/08/18 1400  piperacillin-tazobactam (ZOSYN) IVPB 3.375 g     3.375 g 12.5 mL/hr over 240 Minutes Intravenous Every 8 hours 11/08/18 0548     11/05/18 1900  piperacillin-tazobactam (ZOSYN) IVPB 3.375 g  Status:  Discontinued     3.375 g 12.5 mL/hr over 240 Minutes Intravenous Every 8 hours 11/05/18 1839 11/08/18 0548      Lab Results:  Recent Labs    11/07/18 0329 11/08/18 0346  WBC 10.0 8.5  HGB 12.9* 13.5   HCT 37.1* 38.5*  PLT 163 188   BMET Recent Labs    11/07/18 0329 11/08/18 0346  NA 132* 134*  K 3.6 3.4*  CL 97* 97*  CO2 26 24  GLUCOSE 104* 99  BUN 25* 24*  CREATININE 2.62* 2.42*  CALCIUM 8.0* 8.4*   PT/INR No results for input(s): LABPROT, INR in the last 72 hours. CMP     Component Value Date/Time   NA 134 (L) 11/08/2018 0346   NA 141 04/20/2016 1246   K 3.4 (L) 11/08/2018 0346   CL 97 (L) 11/08/2018 0346   CO2 24 11/08/2018 0346   GLUCOSE 99 11/08/2018 0346   BUN 24 (H) 11/08/2018 0346   BUN 15 04/20/2016 1246   CREATININE 2.42 (H) 11/08/2018 0346   CALCIUM 8.4 (L) 11/08/2018 0346   PROT 5.7 (L) 11/08/2018 0346   ALBUMIN 2.5 (L) 11/08/2018 0346   AST 173 (H) 11/08/2018 0346   ALT 323 (H) 11/08/2018 0346   ALKPHOS 219 (H) 11/08/2018 0346   BILITOT 2.7 (H) 11/08/2018 0346   GFRNONAA 26 (L) 11/08/2018 0346   GFRAA 30 (L) 11/08/2018 0346   Lipase     Component Value Date/Time   LIPASE 29 11/07/2018 0329    Studies/Results: No results found.    Kalman Drape , [facility] 11/08/2018, 7:58 AM  Pager: [phone] Mon-Wed, Friday 7:00am-4:30pm Thurs 7am-11:30am  Consults:  [phone]  

## 2018-11-08 NOTE — Progress Notes (Addendum)
Daily Rounding Note  11/08/2018, 8:32 AM  LOS: 4 days   SUBJECTIVE:   Chief complaint:  Cholecystitis  Feels well, tolerating clears.   No signif abd pain, no dyspnea  OBJECTIVE:         Vital signs in last 24 hours:    Temp:  [98.4 F (36.9 C)-98.7 F (37.1 C)] 98.4 F (36.9 C) (07/23 0457) Pulse Rate:  [68-74] 68 (07/23 0457) Resp:  [18-23] 18 (07/23 0457) BP: (90-117)/(55-77) 117/77 (07/23 0457) SpO2:  [89 %-93 %] 89 % (07/23 0457) Weight:  [104 kg] 104 kg (07/23 0457) Last BM Date: 11/03/18 Filed Weights   11/06/18 0454 11/07/18 0414 11/08/18 0457  Weight: 105 kg 105.6 kg 104 kg   General: pleasant, comfortable.  Does not look ill   Heart: RRR.  NSR in 70s Chest: clear bil.  No dyspnea Abdomen: soft, NT.  Active BS  Extremities: no CCE Neuro/Psych:  Pleasant, engaged.  No gross deficits or weakness  Intake/Output from previous day: 07/22 0701 - 07/23 0700 In: 1001 [P.O.:960; IV Piggyback:41] Out: 1700 [Urine:1700]  Intake/Output this shift: No intake/output data recorded.  Lab Results: Recent Labs    11/06/18 1518 11/07/18 0329 11/08/18 0346  WBC 10.6* 10.0 8.5  HGB 13.1 12.9* 13.5  HCT 37.4* 37.1* 38.5*  PLT 153 163 188   BMET Recent Labs    11/06/18 1518 11/07/18 0329 11/08/18 0346  NA  --  132* 134*  K  --  3.6 3.4*  CL  --  97* 97*  CO2  --  26 24  GLUCOSE  --  104* 99  BUN  --  25* 24*  CREATININE 1.89* 2.62* 2.42*  CALCIUM  --  8.0* 8.4*   LFT Recent Labs    11/07/18 0329 11/08/18 0346  PROT 5.5* 5.7*  ALBUMIN 2.5* 2.5*  AST 435* 173*  ALT 530* 323*  ALKPHOS 271* 219*  BILITOT 5.1* 2.7*  BILIDIR 3.2*  --   IBILI 1.9*  --    Scheduled Meds: . atorvastatin  80 mg Oral Daily  . cholecalciferol  1,000 Units Oral QHS  . enoxaparin (LOVENOX) injection  40 mg Subcutaneous Q24H  . ezetimibe  10 mg Oral Daily  . metoprolol succinate  25 mg Oral QPM  . pantoprazole  40  mg Oral Daily  . tamsulosin  0.4 mg Oral q morning - 10a   Continuous Infusions: . sodium chloride Stopped (11/06/18 0257)  . piperacillin-tazobactam (ZOSYN)  IV     PRN Meds:.sodium chloride, acetaminophen **OR** acetaminophen, alum & mag hydroxide-simeth, HYDROcodone-acetaminophen, nitroGLYCERIN, ondansetron **OR** ondansetron (ZOFRAN) IV  ASSESMENT:   *    Acute Cholecystitis, gallstones.   Laparoscopic cholecystectomy planed 7/24 after Plavix wash out. Day 4 Zosyn.    *     LFT elevation, improved.    *     Chronic Plavix for CAD, possible A. Fib.  Last dose on 11/05/18 at 1600.    *     Ischemic cardiomyopathy.  AICD in place.  EF 25-35%.  Moderate diastolic dysfunction.  *    AKI.  Improved, persists.  No IVF in place  *   Mild hypokalemia.     PLAN   *   Lap chole with IOC tmrw.      Stephen Macdonald  11/08/2018, 8:32 AM Phone [phone]   Attending physician's note   I have taken an interval history, reviewed the chart and  examined the patient. I agree with the Advanced Practitioner's note, impression and recommendations.   Stephen Macdonald , MD [phone]

## 2018-11-08 NOTE — Progress Notes (Addendum)
Patient Demographics:    Stephen Macdonald, is a 73 y.o. male, DOB - Nov 29, 1945, OIT:[id number]  Admit date - 11/03/2018   Admitting Physician Merton Border, MD  Outpatient Primary MD for the patient is Lucia Gaskins, MD  LOS - 4   Chief Complaint  Patient presents with   Chest Pain        Subjective:    [name] today has no fevers, no emesis,  Pt was seen with Dr Dorris Carnes (Card) and PA Azucena Freed at bedside.... 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 %   AKI (acute kidney injury) ([place])   Acute calculous cholecystitis   HTN (hypertension)   CAD (coronary artery disease)   Chest pain   Chronic systolic heart failure (HCC)   Midepigastric pain   Abdominal pain   Abnormal LFTs  Brief Summary:- History of CAD/CABG (1997), multiple NSTEMI's, last stents 58/3094,  Systolic CHF/ICM with EF of 20 to 25%, admitted on 11/03/2018 with epigastric/chest pain and found to have acute calculus cholecystitis,  General surgery  consulted and plans for lap chole Vs IR cholecystectomy tube placement on 11/09/2018 with cardiology preop clearance   A/p 1)Acute calculus cholecystitis--- LFTs trending up significantly, plan is for lap chole with IOC on 11/09/2018  , last dose of Plavix is 11/05/2018 (4 pm) --If IOC is positive on 11/09/18 patient will need ERCP by GI service --CT abdomen and pelvis and HIDA scan report noted --Currently on IV Zosyn -Continue Protonix --- Suspect that patient has passed a CBD stone --- LFTs are trending back down--AST is down to 153 from 435, ALT is down to 323 from 530, alk phos is down to 219 from 271, T bili is down to 2.7 from 5.1,  2)HFrEF/ICM s/p AICD--patient with ischemic cardio- myopathy/combined systolic and  diastolic dysfunction CHF with EF of 20 to 25 % as per echo from 11/04/2018-- AICD battery apparently expiring October 2020,   Medtronic device interrogation reveals no significant arrhythmia, specifically no atrial fibrillation =--- Patient usually follows with [facility]ology at [facility], his AICD has not gone off previously -STOP Avapro 37.5 mg daily, STOP  Lasix 40 mg daily-due to kidney concerns, - 3)H/o CAD--prior MIs, prior CABG, prior angioplasty and stent placement, currently chest pain-free, continue aspirin 81 mg daily, hold Lipitor due to significant LFT elevation, continue Zetia 10 mg daily, continue Toprol-XL 25 mg daily  4)AKI----acute kidney injury due to poor oral intake resulting in dehydration and transient hypotension in the setting of acute cholecystitis,    creatinine on admission=1.30  ,   baseline creatinine = presumed previously normal    , creatinine is now= 2.42 (Peak was 2.62)     , renally adjust medications, avoid nephrotoxic agents/dehydration/hypotension -STOP Avapro 37.5 mg daily, STOP  Lasix 40 mg daily-due to kidney concerns, --Be very judicious with IV fluids as patient's EF is down to 20 to 25%  5)HTN-stable, STOP Avapro 37.5 mg daily, STOP  Lasix 40 mg daily-due to kidney concerns,   may use IV Hydralazine 10 mg  Every 4 hours Prn for systolic blood  pressure over 160 mmhg  Disposition/Need for in-Hospital Stay- patient unable to be discharged at this time due to worsening renal function, acute cholecystitis awaiting surgical intervention-- Lap chole 11/09/18  Code Status : Full  Family Communication:   NA (patient is alert, awake and coherent)  Disposition Plan  : TBD postop  Consults  : Cardiology/general surgery/GI service  DVT Prophylaxis  :  Lovenox -   SCDs  Lab Results  Component Value Date   PLT 188 11/08/2018    Inpatient Medications  Scheduled Meds:  cholecalciferol  1,000 Units Oral QHS   enoxaparin (LOVENOX) injection  40 mg  Subcutaneous Q24H   ezetimibe  10 mg Oral Daily   metoprolol succinate  25 mg Oral QPM   pantoprazole  40 mg Oral Daily   potassium chloride  40 mEq Oral Once   tamsulosin  0.4 mg Oral q morning - 10a   Continuous Infusions:  sodium chloride Stopped (11/06/18 0257)   piperacillin-tazobactam (ZOSYN)  IV 3.375 g (11/08/18 1443)   PRN Meds:.sodium chloride, acetaminophen **OR** acetaminophen, alum & mag hydroxide-simeth, HYDROcodone-acetaminophen, morphine injection, nitroGLYCERIN, ondansetron **OR** ondansetron (ZOFRAN) IV    Anti-infectives (From admission, onward)   Start     Dose/Rate Route Frequency Ordered Stop   11/08/18 1400  piperacillin-tazobactam (ZOSYN) IVPB 3.375 g     3.375 g 12.5 mL/hr over 240 Minutes Intravenous Every 8 hours 11/08/18 0548     11/05/18 1900  piperacillin-tazobactam (ZOSYN) IVPB 3.375 g  Status:  Discontinued     3.375 g 12.5 mL/hr over 240 Minutes Intravenous Every 8 hours 11/05/18 1839 11/08/18 0548       Objective:   Vitals:   11/07/18 2019 11/08/18 0031 11/08/18 0457 11/08/18 1409  BP: 99/65  117/77 109/68  Pulse: 74  68 66  Resp:  '19 18 18  ' Temp: 98.6 F (37 C)  98.4 F (36.9 C) 98.1 F (36.7 C)  TempSrc: Oral  Oral Oral  SpO2: 92%  (!) 89% 96%  Weight:   104 kg   Height:        Wt Readings from Last 3 Encounters:  11/08/18 104 kg  10/31/16 107.7 kg  07/21/16 108.8 kg     Intake/Output Summary (Last 24 hours) at 11/08/2018 1602 Last data filed at 11/08/2018 1441 Gross per 24 hour  Intake 1360 ml  Output 2325 ml  Net -965 ml   Physical Exam  Gen:- Awake Alert, in no acute distress HEENT:- [place].AT, No sclera icterus Neck-Supple Neck,No JVD,.  Lungs-  CTAB , fair symmetrical air movement CV- S1, S2 normal, regular , prior sternotomy scar/AICD in situ Abd-  +ve B.Sounds, Abd Soft, epigastric tenderness, no rebound or guarding    Extremity/Skin:- No  edema, pedal pulses present  Psych-affect is appropriate, oriented  x3 Neuro-no new focal deficits, no tremors   Data Review:   Micro Results Recent Results (from the past 240 hour(s))  SARS Coronavirus 2 (CEPHEID - Performed in [place] hospital lab), Hosp Order     Status: None   Collection Time: 11/03/18 10:55 AM   Specimen: Nasopharyngeal Swab  Result Value Ref Range Status   SARS Coronavirus 2 NEGATIVE NEGATIVE Final    Comment: (NOTE) If result is NEGATIVE SARS-CoV-2 target nucleic acids are NOT DETECTED. The SARS-CoV-2 RNA is generally detectable in upper and lower  respiratory specimens during the acute phase of infection. The lowest  concentration of SARS-CoV-2 viral copies this assay can detect is 250  copies / mL.  A negative result does not preclude SARS-CoV-2 infection  and should not be used as the sole basis for treatment or other  patient management decisions.  A negative result may occur with  improper specimen collection / handling, submission of specimen other  than nasopharyngeal swab, presence of viral mutation(s) within the  areas targeted by this assay, and inadequate number of viral copies  (<250 copies / mL). A negative result must be combined with clinical  observations, patient history, and epidemiological information. If result is POSITIVE SARS-CoV-2 target nucleic acids are DETECTED. The SARS-CoV-2 RNA is generally detectable in upper and lower  respiratory specimens dur ing the acute phase of infection.  Positive  results are indicative of active infection with SARS-CoV-2.  Clinical  correlation with patient history and other diagnostic information is  necessary to determine patient infection status.  Positive results do  not rule out bacterial infection or co-infection with other viruses. If result is PRESUMPTIVE POSTIVE SARS-CoV-2 nucleic acids MAY BE PRESENT.   A presumptive positive result was obtained on the submitted specimen  and confirmed on repeat testing.  While 2019 novel coronavirus  (SARS-CoV-2)  nucleic acids may be present in the submitted sample  additional confirmatory testing may be necessary for epidemiological  and / or clinical management purposes  to differentiate between  SARS-CoV-2 and other Sarbecovirus currently known to infect humans.  If clinically indicated additional testing with an alternate test  methodology [phone]) is advised. The SARS-CoV-2 RNA is generally  detectable in upper and lower respiratory sp ecimens during the acute  phase of infection. The expected result is Negative. Fact Sheet for Patients:  StrictlyIdeas.no Fact Sheet for Healthcare Providers: BankingDealers.co.za This test is not yet approved or cleared by the [place] FDA and has been authorized for detection and/or diagnosis of SARS-CoV-2 by FDA under an Emergency Use Authorization (EUA).  This EUA will remain in effect (meaning this test can be used) for the duration of the COVID-19 declaration under Section 564(b)(1) of the Act, 21 U.S.C. section 360bbb-3(b)(1), unless the authorization is terminated or revoked sooner. Performed at [facility], [place] [address], [place],  [address]     Radiology Reports Nm Hepatobiliary Liver Func  Result Date: 11/05/2018 CLINICAL DATA:  73 year old male with a history of epigastric pain and CT evidence of cholelithiasis and possible cholecystitis EXAM: NUCLEAR MEDICINE HEPATOBILIARY IMAGING TECHNIQUE: Sequential images of the abdomen were obtained out to 60 minutes following intravenous administration of radiopharmaceutical. RADIOPHARMACEUTICALS:  5.1 mCi Tc-4m Choletec IV COMPARISON:  CT November 04, 2018, ultrasound November 04, 2018 FINDINGS: Planar imaging of the abdomen performed after administration of nuclear medicine radiotracer. After 30 minutes of imaging, 3 mg IV morphine was administered with continuation of the planar imaging. 120 minutes of planar imaging acquired. Prompt uniform  uptake and biliary excretion of activity by the liver is seen. Gallbladder activity is never visualized. Biliary activity passes into small bowel, consistent with patent common bile duct. IMPRESSION: HIDA study is positive for acute calculus cholecystitis. Electronically Signed   By: JCorrie MckusickD.O.   On: 11/05/2018 16:03   [place]Abdomen Complete  Result Date: 11/04/2018 CLINICAL DATA:  Initial evaluation for acute abdominal pain for 2 days. EXAM: ABDOMEN ULTRASOUND COMPLETE COMPARISON:  None available. FINDINGS: Gallbladder: Multiple small stones seen within the gallbladder lumen, largest of which measures 5.5 mm. Gallbladder wall minimally thickened to 4 mm, which could be related incomplete distension. No free pericholecystic fluid. No sonographic Murphy sign  elicited on exam. Common bile duct: Diameter: 4.8 mm Liver: No focal lesion identified. Mildly increased echogenicity within the hepatic parenchyma, suggesting steatosis. Portal vein is patent on color Doppler imaging with normal direction of blood flow towards the liver. IVC: No abnormality visualized. Pancreas: Not visualized. Spleen: Size and appearance within normal limits. Right Kidney: Length: 12.1 cm. Echogenicity within normal limits. No hydronephrosis. Few scattered simple cyst present, largest of which measures 4.8 x 4.7 x 4.4 cm at the upper pole. Left Kidney: Length: 13.7 mm. Echogenicity within normal limits. No mass or hydronephrosis visualized. Abdominal aorta: No aneurysm visualized. Other findings: None. IMPRESSION: 1. Cholelithiasis with minimal gallbladder wall thickening. No other sonographic features for acute cholecystitis. No biliary dilatation. 2. Mildly increased echogenicity within the patent parenchyma, suggesting steatosis. 3. Scattered simple right renal cysts measuring up to 4.8 cm. 4. Otherwise negative abdominal ultrasound. No other acute abnormality identified. Electronically Signed   By: Jeannine Boga M.D.   On:  11/04/2018 05:06   Ct Abdomen Pelvis W Contrast  Result Date: 11/04/2018 CLINICAL DATA:  Epigastric pain and tenderness. Stage 3 kidney disease. EXAM: CT ABDOMEN AND PELVIS WITH CONTRAST TECHNIQUE: Multidetector CT imaging of the abdomen and pelvis was performed using the standard protocol following bolus administration of intravenous contrast. CONTRAST:  191m OMNIPAQUE IOHEXOL 300 MG/ML  SOLN COMPARISON:  Right upper quadrant ultrasound today. FINDINGS: Lower chest: Atelectasis right base. Cardiac pacer leads are present. Hepatobiliary: Liver and biliary tree are within normal. Mild to moderate cholelithiasis is present. Subtle stranding of the fat adjacent the gallbladder. No significant wall thickening. Pancreas: Normal. Spleen: Multiple calcified granulomas. Adrenals/Urinary Tract: Adrenal glands are normal. Kidneys are normal in size without hydronephrosis or nephrolithiasis. Right renal cyst measuring 5.6 cm. 1 cm right renal hypodensity too small to characterize but likely a cyst. Ureters and bladder are normal. Stomach/Bowel: Stomach and small bowel are normal. Appendix is not visualized. Mild diverticulosis of the colon. Vascular/Lymphatic: Mild calcified plaque over the abdominal aorta. No adenopathy. Reproductive: Normal. Other: No free fluid. Musculoskeletal: Mild degenerative change of the spine and hips. IMPRESSION: Mild to moderate cholelithiasis. Subtle stranding of the pericholecystic fat which could be seen with mild acute cholecystitis. Note that the right upper quadrant ultrasound earlier today was also somewhat equivocal for acute cholecystitis. Consider HIDA scan for further evaluation. 5.6 cm right renal cyst and 1 cm right renal cortical hypodensity too small to characterize but likely a cyst. Recommend follow-up CT 6 months. Colonic diverticulosis. Aortic Atherosclerosis (ICD10-I70.0). Electronically Signed   By: DMarin OlpM.D.   On: 11/04/2018 18:50   Dg Chest Port 1  View  Result Date: 11/03/2018 CLINICAL DATA:  Chest pain and shortness of breath. EXAM: PORTABLE CHEST 1 VIEW COMPARISON:  Chest x-ray dated 04/15/2016. FINDINGS: LEFT chest wall pacemaker/ICD apparatus appears stable in position. Median sternotomy wires appear intact. Lungs are clear. No pleural effusion or pneumothorax seen. No acute appearing osseous abnormality. IMPRESSION: No active disease. No evidence of pneumonia or pulmonary edema. Electronically Signed   By: SFranki CabotM.D.   On: 11/03/2018 10:41     CBC Recent Labs  Lab 11/03/18 1044 11/06/18 1518 11/07/18 0329 11/08/18 0346  WBC 8.9 10.6* 10.0 8.5  HGB 14.7 13.1 12.9* 13.5  HCT 41.9 37.4* 37.1* 38.5*  PLT 194 153 163 188  MCV 93.7 93.3 94.2 92.3  MCH 32.9 32.7 32.7 32.4  MCHC 35.1 35.0 34.8 35.1  RDW 11.9 11.8 11.9 11.7   Chemistries  Recent  Labs  Lab 11/03/18 1044 11/03/18 1227 11/04/18 0755 11/06/18 1518 11/07/18 0329 11/08/18 0346  NA 138  --  136  --  132* 134*  K 4.3  --  4.0  --  3.6 3.4*  CL 108  --  105  --  97* 97*  CO2 20*  --  24  --  26 24  GLUCOSE 151*  --  122*  --  104* 99  BUN 13  --  13  --  25* 24*  CREATININE 1.30*  --  1.19 1.89* 2.62* 2.42*  CALCIUM 8.8*  --  8.7*  --  8.0* 8.4*  MG  --   --   --   --  2.5*  --   AST  --  19  --   --  435* 173*  ALT  --  28  --   --  530* 323*  ALKPHOS  --  57  --   --  271* 219*  BILITOT  --  1.0  --   --  5.1* 2.7*   ------------------------------------------------------------------------------------------------------------------ No results for input(s): CHOL, HDL, LDLCALC, TRIG, CHOLHDL, LDLDIRECT in the last 72 hours.  Lab Results  Component Value Date   HGBA1C 5.6 04/16/2016   ------------------------------------------------------------------------------------------------------------------ No results for input(s): TSH, T4TOTAL, T3FREE, THYROIDAB in the last 72 hours.  Invalid input(s):  FREET3 ------------------------------------------------------------------------------------------------------------------ No results for input(s): VITAMINB12, FOLATE, FERRITIN, TIBC, IRON, RETICCTPCT in the last 72 hours.  Coagulation profile No results for input(s): INR, PROTIME in the last 168 hours.  No results for input(s): DDIMER in the last 72 hours.  Cardiac Enzymes No results for input(s): CKMB, TROPONINI, MYOGLOBIN in the last 168 hours.  Invalid input(s): CK ------------------------------------------------------------------------------------------------------------------    Component Value Date/Time   BNP 175.7 (H) 04/16/2016 7354   Roxan Hockey M.D on 11/08/2018 at 4:02 PM  Go to [email or web address] - for contact info  [facility] - Office  [phone]

## 2018-11-09 ENCOUNTER — Encounter (HOSPITAL_COMMUNITY): Payer: Self-pay | Admitting: *Deleted

## 2018-11-09 ENCOUNTER — Inpatient Hospital Stay (HOSPITAL_COMMUNITY): Payer: Medicare Other | Admitting: Anesthesiology

## 2018-11-09 ENCOUNTER — Encounter (HOSPITAL_COMMUNITY)
Admission: EM | Disposition: A | Discharge status: Home / Self Care | Payer: Self-pay | Source: Home / Self Care | Attending: Family Medicine

## 2018-11-09 ENCOUNTER — Inpatient Hospital Stay (HOSPITAL_COMMUNITY): Payer: Medicare Other

## 2018-11-09 HISTORY — PX: CHOLECYSTECTOMY: SHX55

## 2018-11-09 LAB — CBC
HCT: 39.5 % (ref 39.0–52.0)
Hemoglobin: 13.9 g/dL (ref 13.0–17.0)
MCH: 32.6 pg (ref 26.0–34.0)
MCHC: 35.2 g/dL (ref 30.0–36.0)
MCV: 92.5 fL (ref 80.0–100.0)
Platelets: 228 10*3/uL (ref 150–400)
RBC: 4.27 MIL/uL (ref 4.22–5.81)
RDW: 11.6 % (ref 11.5–15.5)
WBC: 8.3 10*3/uL (ref 4.0–10.5)
nRBC: 0 % (ref 0.0–0.2)

## 2018-11-09 LAB — COMPREHENSIVE METABOLIC PANEL
ALT: 231 U/L — ABNORMAL HIGH (ref 0–44)
AST: 97 U/L — ABNORMAL HIGH (ref 15–41)
Albumin: 2.6 g/dL — ABNORMAL LOW (ref 3.5–5.0)
Alkaline Phosphatase: 203 U/L — ABNORMAL HIGH (ref 38–126)
Anion gap: 11 (ref 5–15)
BUN: 17 mg/dL (ref 8–23)
CO2: 22 mmol/L (ref 22–32)
Calcium: 8.7 mg/dL — ABNORMAL LOW (ref 8.9–10.3)
Chloride: 103 mmol/L (ref 98–111)
Creatinine, Ser: 1.89 mg/dL — ABNORMAL HIGH (ref 0.61–1.24)
GFR calc Af Amer: 40 mL/min — ABNORMAL LOW (ref >60)
GFR calc non Af Amer: 34 mL/min — ABNORMAL LOW (ref >60)
Glucose, Bld: 98 mg/dL (ref 70–99)
Potassium: 4.2 mmol/L (ref 3.5–5.1)
Sodium: 136 mmol/L (ref 135–145)
Total Bilirubin: 1.5 mg/dL — ABNORMAL HIGH (ref 0.3–1.2)
Total Protein: 6 g/dL — ABNORMAL LOW (ref 6.5–8.1)

## 2018-11-09 LAB — SURGICAL PCR SCREEN
MRSA, PCR: NEGATIVE
Staphylococcus aureus: NEGATIVE

## 2018-11-09 SURGERY — LAPAROSCOPIC CHOLECYSTECTOMY WITH INTRAOPERATIVE CHOLANGIOGRAM
Anesthesia: General | Site: Abdomen

## 2018-11-09 MED ORDER — SUGAMMADEX SODIUM 200 MG/2ML IV SOLN
INTRAVENOUS | Status: DC | PRN
  Administered 2018-11-09: 200 mg via INTRAVENOUS

## 2018-11-09 MED ORDER — MORPHINE SULFATE (PF) 2 MG/ML IV SOLN: 1.0000 mg | INTRAVENOUS | Status: DC | PRN

## 2018-11-09 MED ORDER — ONDANSETRON HCL 4 MG/2ML IJ SOLN
INTRAMUSCULAR | Status: AC
  Filled 2018-11-09: qty 2

## 2018-11-09 MED ORDER — FENTANYL CITRATE (PF) 250 MCG/5ML IJ SOLN
INTRAMUSCULAR | Status: AC
  Filled 2018-11-09: qty 5

## 2018-11-09 MED ORDER — ONDANSETRON HCL 4 MG/2ML IJ SOLN: 4.0000 mg | Freq: Once | INTRAMUSCULAR | Status: DC | PRN

## 2018-11-09 MED ORDER — FENTANYL CITRATE (PF) 250 MCG/5ML IJ SOLN
INTRAMUSCULAR | Status: DC | PRN
  Administered 2018-11-09 (×2): 50 ug via INTRAVENOUS
  Administered 2018-11-09 (×4): 25 ug via INTRAVENOUS
  Administered 2018-11-09: 50 ug via INTRAVENOUS

## 2018-11-09 MED ORDER — DEXAMETHASONE SODIUM PHOSPHATE 10 MG/ML IJ SOLN
INTRAMUSCULAR | Status: DC | PRN
  Administered 2018-11-09: 4 mg via INTRAVENOUS

## 2018-11-09 MED ORDER — HYDRALAZINE HCL 20 MG/ML IJ SOLN: 10.0000 mg | Freq: Four times a day (QID) | INTRAMUSCULAR | Status: DC | PRN

## 2018-11-09 MED ORDER — FENTANYL CITRATE (PF) 100 MCG/2ML IJ SOLN
INTRAMUSCULAR | Status: AC
  Filled 2018-11-09: qty 2

## 2018-11-09 MED ORDER — PHENYLEPHRINE 40 MCG/ML (10ML) SYRINGE FOR IV PUSH (FOR BLOOD PRESSURE SUPPORT)
PREFILLED_SYRINGE | INTRAVENOUS | Status: DC | PRN
  Administered 2018-11-09 (×2): 80 ug via INTRAVENOUS

## 2018-11-09 MED ORDER — ACETAMINOPHEN 500 MG PO TABS
1000.0000 mg | ORAL_TABLET | Freq: Four times a day (QID) | ORAL | Status: DC
  Administered 2018-11-09 – 2018-11-10 (×4): 1000 mg via ORAL
  Filled 2018-11-09 (×4): qty 2

## 2018-11-09 MED ORDER — PIPERACILLIN-TAZOBACTAM 3.375 G IVPB
3.3750 g | Freq: Three times a day (TID) | INTRAVENOUS | Status: AC
  Administered 2018-11-09 – 2018-11-10 (×3): 3.375 g via INTRAVENOUS
  Filled 2018-11-09 (×3): qty 50

## 2018-11-09 MED ORDER — HEMOSTATIC AGENTS (NO CHARGE) OPTIME
TOPICAL | Status: DC | PRN
  Administered 2018-11-09: 1

## 2018-11-09 MED ORDER — STERILE WATER FOR IRRIGATION IR SOLN
Status: DC | PRN
  Administered 2018-11-09: 1000 mL

## 2018-11-09 MED ORDER — BUPIVACAINE-EPINEPHRINE 0.25% -1:200000 IJ SOLN
INTRAMUSCULAR | Status: DC | PRN
  Administered 2018-11-09: 12 mL

## 2018-11-09 MED ORDER — BUPIVACAINE-EPINEPHRINE (PF) 0.25% -1:200000 IJ SOLN
INTRAMUSCULAR | Status: AC
  Filled 2018-11-09: qty 30

## 2018-11-09 MED ORDER — DEXAMETHASONE SODIUM PHOSPHATE 10 MG/ML IJ SOLN
INTRAMUSCULAR | Status: AC
  Filled 2018-11-09: qty 1

## 2018-11-09 MED ORDER — LIDOCAINE 2% (20 MG/ML) 5 ML SYRINGE
INTRAMUSCULAR | Status: AC
  Filled 2018-11-09: qty 5

## 2018-11-09 MED ORDER — SODIUM CHLORIDE 0.9 % IR SOLN
Status: DC | PRN
  Administered 2018-11-09: 1000 mL

## 2018-11-09 MED ORDER — SODIUM CHLORIDE 0.9 % IV SOLN
INTRAVENOUS | Status: DC | PRN
  Administered 2018-11-09: 10 mL

## 2018-11-09 MED ORDER — ONDANSETRON HCL 4 MG/2ML IJ SOLN
INTRAMUSCULAR | Status: DC | PRN
  Administered 2018-11-09: 4 mg via INTRAVENOUS

## 2018-11-09 MED ORDER — ROCURONIUM BROMIDE 10 MG/ML (PF) SYRINGE
PREFILLED_SYRINGE | INTRAVENOUS | Status: AC
  Filled 2018-11-09: qty 10

## 2018-11-09 MED ORDER — OXYCODONE HCL 5 MG PO TABS
5.0000 mg | ORAL_TABLET | ORAL | Status: DC | PRN
  Administered 2018-11-10: 06:00:00 5 mg via ORAL
  Filled 2018-11-09: qty 1

## 2018-11-09 MED ORDER — ROCURONIUM BROMIDE 10 MG/ML (PF) SYRINGE
PREFILLED_SYRINGE | INTRAVENOUS | Status: DC | PRN
  Administered 2018-11-09: 10 mg via INTRAVENOUS
  Administered 2018-11-09: 60 mg via INTRAVENOUS
  Administered 2018-11-09: 10 mg via INTRAVENOUS

## 2018-11-09 MED ORDER — SODIUM CHLORIDE 0.9 % IV SOLN
INTRAVENOUS | Status: DC | PRN
  Administered 2018-11-09: 10:00:00 30 ug/min via INTRAVENOUS

## 2018-11-09 MED ORDER — ENOXAPARIN SODIUM 40 MG/0.4ML ~~LOC~~ SOLN
40.0000 mg | SUBCUTANEOUS | Status: DC
  Filled 2018-11-09: qty 0.4

## 2018-11-09 MED ORDER — PROPOFOL 10 MG/ML IV BOLUS
INTRAVENOUS | Status: AC
  Filled 2018-11-09: qty 20

## 2018-11-09 MED ORDER — ESMOLOL HCL 100 MG/10ML IV SOLN
INTRAVENOUS | Status: DC | PRN
  Administered 2018-11-09 (×5): 20 mg via INTRAVENOUS

## 2018-11-09 MED ORDER — 0.9 % SODIUM CHLORIDE (POUR BTL) OPTIME
TOPICAL | Status: DC | PRN
  Administered 2018-11-09: 1000 mL

## 2018-11-09 MED ORDER — PROPOFOL 10 MG/ML IV BOLUS
INTRAVENOUS | Status: DC | PRN
  Administered 2018-11-09: 20 mg via INTRAVENOUS
  Administered 2018-11-09: 70 mg via INTRAVENOUS
  Administered 2018-11-09: 20 mg via INTRAVENOUS

## 2018-11-09 MED ORDER — LIDOCAINE 2% (20 MG/ML) 5 ML SYRINGE
INTRAMUSCULAR | Status: DC | PRN
  Administered 2018-11-09: 40 mg via INTRAVENOUS
  Administered 2018-11-09: 60 mg via INTRAVENOUS

## 2018-11-09 MED ORDER — LACTATED RINGERS IV SOLN
INTRAVENOUS | Status: DC | PRN
  Administered 2018-11-09: 10:00:00 via INTRAVENOUS

## 2018-11-09 MED ORDER — FENTANYL CITRATE (PF) 100 MCG/2ML IJ SOLN
25.0000 ug | INTRAMUSCULAR | Status: DC | PRN
  Administered 2018-11-09: 25 ug via INTRAVENOUS

## 2018-11-09 SURGICAL SUPPLY — 49 items
APPLIER CLIP ROT 10 11.4 M/L (STAPLE) ×3
BENZOIN TINCTURE PRP APPL 2/3 (GAUZE/BANDAGES/DRESSINGS) ×3 IMPLANT
BLADE CLIPPER SURG (BLADE) ×2 IMPLANT
CANISTER SUCT 3000ML PPV (MISCELLANEOUS) ×3 IMPLANT
CHLORAPREP W/TINT 26 (MISCELLANEOUS) ×3 IMPLANT
CLIP APPLIE ROT 10 11.4 M/L (STAPLE) ×1 IMPLANT
CLOSURE WOUND 1/2 X4 (GAUZE/BANDAGES/DRESSINGS) ×1
COVER MAYO STAND STRL (DRAPES) ×3 IMPLANT
COVER SURGICAL LIGHT HANDLE (MISCELLANEOUS) ×3 IMPLANT
DRAPE C-ARM 42X72 X-RAY (DRAPES) ×3 IMPLANT
DRSG TEGADERM 2-3/8X2-3/4 SM (GAUZE/BANDAGES/DRESSINGS) ×9 IMPLANT
DRSG TEGADERM 4X4.75 (GAUZE/BANDAGES/DRESSINGS) ×3 IMPLANT
ELECT REM PT RETURN 9FT ADLT (ELECTROSURGICAL) ×3
ELECTRODE REM PT RTRN 9FT ADLT (ELECTROSURGICAL) ×1 IMPLANT
GAUZE SPONGE 2X2 8PLY STRL LF (GAUZE/BANDAGES/DRESSINGS) ×1 IMPLANT
GLOVE BIO SURGEON STRL SZ7 (GLOVE) ×3 IMPLANT
GLOVE BIOGEL PI IND STRL 6.5 (GLOVE) IMPLANT
GLOVE BIOGEL PI IND STRL 7.0 (GLOVE) IMPLANT
GLOVE BIOGEL PI IND STRL 7.5 (GLOVE) ×1 IMPLANT
GLOVE BIOGEL PI INDICATOR 6.5 (GLOVE) ×2
GLOVE BIOGEL PI INDICATOR 7.0 (GLOVE) ×4
GLOVE BIOGEL PI INDICATOR 7.5 (GLOVE) ×2
GLOVE ECLIPSE 7.0 STRL STRAW (GLOVE) ×4 IMPLANT
GLOVE SURG SS PI 6.0 STRL IVOR (GLOVE) ×2 IMPLANT
GLOVE SURG SS PI 6.5 STRL IVOR (GLOVE) ×2 IMPLANT
GOWN STRL REUS W/ TWL LRG LVL3 (GOWN DISPOSABLE) ×3 IMPLANT
GOWN STRL REUS W/TWL LRG LVL3 (GOWN DISPOSABLE) ×6
HEMOSTAT SNOW SURGICEL 2X4 (HEMOSTASIS) ×2 IMPLANT
KIT BASIN OR (CUSTOM PROCEDURE TRAY) ×3 IMPLANT
KIT TURNOVER KIT B (KITS) ×3 IMPLANT
NS IRRIG 1000ML POUR BTL (IV SOLUTION) ×3 IMPLANT
PAD ARMBOARD 7.5X6 YLW CONV (MISCELLANEOUS) ×3 IMPLANT
POUCH RETRIEVAL ECOSAC 10 (ENDOMECHANICALS) IMPLANT
POUCH RETRIEVAL ECOSAC 10MM (ENDOMECHANICALS) ×2
SCISSORS LAP 5X35 DISP (ENDOMECHANICALS) ×3 IMPLANT
SET CHOLANGIOGRAPH 5 50 .035 (SET/KITS/TRAYS/PACK) ×3 IMPLANT
SET IRRIG TUBING LAPAROSCOPIC (IRRIGATION / IRRIGATOR) ×3 IMPLANT
SET TUBE SMOKE EVAC HIGH FLOW (TUBING) ×3 IMPLANT
SLEEVE ENDOPATH XCEL 5M (ENDOMECHANICALS) ×3 IMPLANT
SPECIMEN JAR SMALL (MISCELLANEOUS) ×3 IMPLANT
SPONGE GAUZE 2X2 STER 10/PKG (GAUZE/BANDAGES/DRESSINGS) ×2
STRIP CLOSURE SKIN 1/2X4 (GAUZE/BANDAGES/DRESSINGS) ×2 IMPLANT
SUT MNCRL AB 4-0 PS2 18 (SUTURE) ×5 IMPLANT
TOWEL GREEN STERILE FF (TOWEL DISPOSABLE) ×3 IMPLANT
TRAY LAPAROSCOPIC MC (CUSTOM PROCEDURE TRAY) ×3 IMPLANT
TROCAR XCEL BLUNT TIP 100MML (ENDOMECHANICALS) ×3 IMPLANT
TROCAR XCEL NON-BLD 11X100MML (ENDOMECHANICALS) ×3 IMPLANT
TROCAR XCEL NON-BLD 5MMX100MML (ENDOMECHANICALS) ×3 IMPLANT
WATER STERILE IRR 1000ML POUR (IV SOLUTION) ×3 IMPLANT

## 2018-11-09 NOTE — Anesthesia Postprocedure Evaluation (Signed)
Anesthesia Post Note  Patient: Stephen Macdonald  Procedure(s) Performed: LAPAROSCOPIC CHOLECYSTECTOMY WITH INTRAOPERATIVE CHOLANGIOGRAM (N/A Abdomen)     Patient location during evaluation: PACU Anesthesia Type: General Level of consciousness: awake and alert Pain management: pain level controlled Vital Signs Assessment: post-procedure vital signs reviewed and stable Respiratory status: spontaneous breathing, nonlabored ventilation and respiratory function stable Cardiovascular status: blood pressure returned to baseline and stable Postop Assessment: no apparent nausea or vomiting Anesthetic complications: no    Last Vitals:  Vitals:   11/09/18 1327 11/09/18 1339  BP: 129/73 130/71  Pulse: 60 63  Resp: 15 17  Temp: 37.1 C   SpO2: 94% 95%    Last Pain:  Vitals:   11/09/18 1327  TempSrc: Oral  PainSc:                  Audry Pili

## 2018-11-09 NOTE — Anesthesia Procedure Notes (Addendum)
Procedure Name: Intubation Performed by: Verdie Drown, CRNA Pre-anesthesia Checklist: Patient identified, Emergency Drugs available, Suction available and Patient being monitored Patient Re-evaluated:Patient Re-evaluated prior to induction Oxygen Delivery Method: Circle System Utilized Preoxygenation: Pre-oxygenation with 100% oxygen Induction Type: IV induction Ventilation: Mask ventilation without difficulty, Two handed mask ventilation required and Oral airway inserted - appropriate to patient size Laryngoscope Size: Mac and 3 Grade View: Grade I Tube type: Oral Tube size: 7.5 mm Number of attempts: 1 Airway Equipment and Method: Stylet and Oral airway Placement Confirmation: ETT inserted through vocal cords under direct vision,  positive ETCO2 and breath sounds checked- equal and bilateral Secured at: 23 cm Tube secured with: Tape Dental Injury: Teeth and Oropharynx as per pre-operative assessment

## 2018-11-09 NOTE — Transfer of Care (Signed)
Immediate Anesthesia Transfer of Care Note  Patient: Stephen Macdonald  Procedure(s) Performed: LAPAROSCOPIC CHOLECYSTECTOMY WITH INTRAOPERATIVE CHOLANGIOGRAM (N/A Abdomen)  Patient Location: PACU  Anesthesia Type:General  Level of Consciousness: awake  Airway & Oxygen Therapy: Patient Spontanous Breathing and Patient connected to face mask oxygen  Post-op Assessment: Report given to RN and Post -op Vital signs reviewed and stable  Post vital signs: Reviewed and stable  Last Vitals:  Vitals Value Taken Time  BP 129/79 11/09/18 1135  Temp 36.7 C 11/09/18 1135  Pulse 66 11/09/18 1137  Resp 12 11/09/18 1137  SpO2 99 % 11/09/18 1137  Vitals shown include unvalidated device data.  Last Pain:  Vitals:   11/09/18 0842  TempSrc:   PainSc: 0-No pain      Patients Stated Pain Goal: 3 (18/56/31 4970)  Complications: No apparent anesthesia complications

## 2018-11-09 NOTE — Progress Notes (Signed)
Dr Fransisco Beau informed that patient has a [employer]tronic Pacemaker.  No orders given.  Emergency procedures for Device Patients placed on chart.

## 2018-11-09 NOTE — Op Note (Signed)
Laparoscopic Cholecystectomy with IOC Procedure Note  Indications: 73 year old male with multiple medical and cardiac issues presents with acute cholecystitis and elevated LFT's.  His Plavix has been held.  He has been cleared by Cardiology as moderate risk and presents now for cholecystectomy.  Pre-operative Diagnosis: Calculus of gallbladder with acute cholecystitis, without mention of obstruction  Post-operative Diagnosis: Same  Surgeon: Wynona Luna   Assistants: None  Anesthesia: General endotracheal anesthesia  ASA Class: 3  Procedure Details  The patient was seen again in the Holding Room. The risks, benefits, complications, treatment options, and expected outcomes were discussed with the patient. The possibilities of reaction to medication, pulmonary aspiration, perforation of viscus, bleeding, recurrent infection, finding a normal gallbladder, the need for additional procedures, failure to diagnose a condition, the possible need to convert to an open procedure, and creating a complication requiring transfusion or operation were discussed with the patient. The likelihood of improving the patient's symptoms with return to their baseline status is good.  The patient and/or family concurred with the proposed plan, giving informed consent. The site of surgery properly noted. The patient was taken to Operating Room, identified as Stephen Macdonald and the procedure verified as Laparoscopic Cholecystectomy with Intraoperative Cholangiogram. A Time Out was held and the above information confirmed.  Prior to the induction of general anesthesia, antibiotic prophylaxis was administered. General endotracheal anesthesia was then administered and tolerated well. After the induction, the abdomen was prepped with Chloraprep and draped in the sterile fashion. The patient was positioned in the supine position.  Local anesthetic agent was injected into the skin near the umbilicus and an incision made.  We dissected down to the abdominal fascia with blunt dissection.  The fascia was incised vertically and we entered the peritoneal cavity bluntly.  A pursestring suture of 0-Vicryl was placed around the fascial opening.  The Hasson cannula was inserted and secured with the stay suture.  Pneumoperitoneum was then created with CO2 and tolerated well without any adverse changes in the patient's vital signs. An 11-mm port was placed in the subxiphoid position.  Two 5-mm ports were placed in the right upper quadrant. All skin incisions were infiltrated with a local anesthetic agent before making the incision and placing the trocars.   We positioned the patient in reverse Trendelenburg, tilted slightly to the patient's left.  The gallbladder was identified, the fundus grasped and retracted cephalad. The gallbladder is quite inflamed and thickened.  The omentum was adherent to the gallbladder. Adhesions were lysed bluntly and with the electrocautery where indicated, taking care not to injure any adjacent organs or viscus. The infundibulum was grasped and retracted laterally, exposing the peritoneum overlying the triangle of Calot. This was then divided and exposed in a blunt fashion. A critical view of the cystic duct and cystic artery was obtained.  The cystic duct was clearly identified and bluntly dissected circumferentially. The cystic duct was ligated with a clip distally.   An incision was made in the cystic duct and the [facility] cholangiogram catheter introduced. The catheter was secured using a clip. A cholangiogram was then obtained which showed good visualization of the distal and proximal biliary tree with no sign of filling defects or obstruction.  Contrast flowed easily into the duodenum. The catheter was then removed.   The cystic duct was then ligated with clips and divided. The cystic artery was identified, dissected free, ligated with clips and divided as well.   The gallbladder was dissected from the  liver bed in retrograde fashion with the electrocautery. The gallbladder was removed and placed in an Eco sac. The liver bed was irrigated and inspected. Hemostasis was achieved with the electrocautery and Surgicel SNOW. Copious irrigation was utilized and was repeatedly aspirated until clear.  The gallbladder and Eco sac were then removed through the umbilical port site.  The pursestring suture was used to close the umbilical fascia.    We again inspected the right upper quadrant for hemostasis.  Pneumoperitoneum was released as we removed the trocars.  4-0 Monocryl was used to close the skin.   Benzoin, steri-strips, and clean dressings were applied. The patient was then extubated and brought to the recovery room in stable condition. Instrument, sponge, and needle counts were correct at closure and at the conclusion of the case.   Findings: Cholecystitis with Cholelithiasis  Estimated Blood Loss: less than 50 mL         Drains: none         Specimens: Gallbladder           Complications: None; patient tolerated the procedure well.         Disposition: PACU - hemodynamically stable.         Condition: stable  Imogene Burn. Georgette Dover, MD, [facility]  General/ Trauma Surgery Beeper [phone]  11/09/2018 11:07 AM

## 2018-11-09 NOTE — Anesthesia Procedure Notes (Signed)
Arterial Line Insertion Start/End7/24/2020 9:30 AM, 11/09/2018 9:40 AM Performed by: Audry Pili, MD, Verdie Drown, CRNA, CRNA  Patient location: Pre-op. Preanesthetic checklist: patient identified, IV checked, site marked, risks and benefits discussed, surgical consent, monitors and equipment checked, pre-op evaluation, timeout performed and anesthesia consent Lidocaine 1% used for infiltration Right, radial was placed Catheter size: 20 G Hand hygiene performed , maximum sterile barriers used  and Seldinger technique used  Attempts: 1 Procedure performed without using ultrasound guided technique. Following insertion, dressing applied. Post procedure assessment: normal and unchanged  Patient tolerated the procedure well with no immediate complications.

## 2018-11-09 NOTE — Anesthesia Preprocedure Evaluation (Addendum)
Anesthesia Evaluation  Patient identified by MRN, date of birth, ID band Patient awake    Reviewed: Allergy & Precautions, NPO status , Patient's Chart, lab work & pertinent test results  History of Anesthesia Complications Negative for: history of anesthetic complications  Airway Mallampati: II  TM Distance: >3 FB Neck ROM: Full    Dental  (+) Edentulous Lower, Edentulous Upper   Pulmonary Current Smoker,    breath sounds clear to auscultation       Cardiovascular hypertension, Pt. on home beta blockers and Pt. on medications (-) angina+ CAD, + Past MI, + Cardiac Stents, + CABG and +CHF  + Cardiac Defibrillator  Rhythm:Regular Rate:Normal   '20 TTE - EF 20-25%. LV cavity size was severely dilated. Left ventricular diffuse hypokinesis. LA size was mildly dilated. Moderate diastolic dysfunction. Mild MR and TR.  Cardiac Preop - "1. Pt has 3V CAD, s/p CABG in 1997.   Pt at mod increased risk for exacerbation of cardiac problems (CHF)   Not prohibitive  No symtpoms to sugg active ischema   He is in SR  Given stable symptoms, would not anticipate further work up at this time. SUrgery planned for tomorrow 2. Chronic systolic CHF  Pt s/p AICD: known EF of 25-35%. Device is followed by cardiology at the [facility]. He has never been shocked  Reports he is close to [facility] and has an appt in the coming weeks for follow up. Volume status is OK   Hold lasix with sl elev of Cr   Fluids will be followed closely after surgery  ... 4. Possible Afib?:  None demonstrated   Interrogation of device shows no arrhythmia"   Neuro/Psych negative neurological ROS  negative psych ROS   GI/Hepatic negative GI ROS,  Elevated LFTs    Endo/Other   Obesity   Renal/GU CRFRenal disease     Musculoskeletal negative musculoskeletal ROS (+)   Abdominal   Peds  Hematology negative hematology ROS (+)   Anesthesia Other Findings   Reproductive/Obstetrics                            Anesthesia Physical Anesthesia Plan  ASA: IV  Anesthesia Plan: General   Post-op Pain Management:    Induction: Intravenous  PONV Risk Score and Plan: 4 or greater and Treatment may vary due to age or medical condition, Ondansetron and Dexamethasone  Airway Management Planned: Oral ETT  Additional Equipment: Arterial line  Intra-op Plan:   Post-operative Plan: Extubation in OR  Informed Consent: I have reviewed the patients History and Physical, chart, labs and discussed the procedure including the risks, benefits and alternatives for the proposed anesthesia with the patient or authorized representative who has indicated his/her understanding and acceptance.     Dental advisory given  Plan Discussed with: CRNA and Anesthesiologist  Anesthesia Plan Comments:        Anesthesia Quick Evaluation

## 2018-11-09 NOTE — Progress Notes (Signed)
Patient Demographics:    Stephen Macdonald, is a 73 y.o. 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 %   AKI (acute kidney injury) ([place])   Acute calculous cholecystitis   HTN (hypertension)   CAD (coronary artery disease)   Chest pain   Chronic systolic heart failure (HCC)   Midepigastric pain   Abdominal pain   Abnormal LFTs  Brief Summary:- History of CAD/CABG (1997), multiple NSTEMI's, last stents 14/4315,  Systolic CHF/ICM with EF of 20 to 25%, admitted on 11/03/2018 with epigastric/chest pain and found to have acute calculus cholecystitis,  General surgery  Consulted, pt is s/p  lap chole with neg IOC on 11/09/18   A/p 1)Acute calculus cholecystitis---  pt is s/p  lap chole with neg IOC on 11/09/18 -- last dose of Plavix is 11/05/2018 (4 pm) ----CT abdomen and pelvis and HIDA scan report noted --Currently on IV Zosyn as per general surgery -Continue Protonix --- Suspect that patient has passed a CBD stone --- LFTs are trending back down-- --Continue to trend LFTs,  2)HFrEF/ICM s/p AICD--patient with ischemic cardio- myopathy/combined systolic and diastolic dysfunction CHF with EF of 20 to 25 % as per echo from 11/04/2018-- AICD battery apparently expiring October 2020,   [employer] device interrogation reveals no significant arrhythmia, specifically no atrial fibrillation =--- Patient usually [facility] with [facility]ology at the [facility],  his AICD has not gone off previously -STOP Avapro 37.5 mg daily, STOP  Lasix 40 mg daily-due to kidney concerns, ---Monitor weight and volume status closely with perioperative fluid infusions - 3)H/o CAD--prior MIs, prior CABG, prior angioplasty and stent placement, currently chest pain-free, continue aspirin 81 mg daily, hold Lipitor due to significant LFT elevation, continue Zetia 10 mg daily, continue Toprol-XL 25 mg daily  4)AKI----acute kidney injury due to poor oral intake resulting in dehydration and transient hypotension in the setting of acute cholecystitis,    creatinine on admission=1.30  ,   baseline creatinine = presumed previously normal    , creatinine is now= 1.89 (Peak was 2.62)     , renally adjust medications, avoid nephrotoxic agents/dehydration/hypotension -STOPped Avapro 37.5 mg daily, STOPped  Lasix 40 mg daily-due to kidney concerns, --Be very judicious with IV fluids as patient's EF is down to 20 to 25%  5)HTN-stable, STOPped Avapro 37.5 mg daily, STOPped  Lasix 40 mg daily-due to kidney concerns,  may use IV Hydralazine 10 mg  Every 4 hours Prn for systolic blood pressure over 160 mmhg  Disposition/Need for in-Hospital Stay- patient unable to be discharged at this time due to worsening renal function, acute cholecystitis awaiting surgical intervention-- Lap chole 11/09/18  Code Status : Full  Family Communication:   NA (  patient is alert, awake and coherent) Discussed with pt's wife-- at [phone]  Disposition Plan  : TBD postop  Consults  : Cardiology/general surgery/GI service  DVT Prophylaxis  :  Lovenox -   SCDs  Lab Results  Component Value Date   PLT 228 11/09/2018   Inpatient Medications  Scheduled Meds:  acetaminophen  1,000 mg Oral Q6H   cholecalciferol  1,000 Units Oral QHS   [START ON 11/10/2018] enoxaparin (LOVENOX) injection  40 mg Subcutaneous Q24H   ezetimibe  10 mg Oral Daily   fentaNYL       metoprolol succinate  25 mg Oral QPM    pantoprazole  40 mg Oral Daily   tamsulosin  0.4 mg Oral q morning - 10a   Continuous Infusions:  sodium chloride Stopped (11/06/18 0257)   piperacillin-tazobactam (ZOSYN)  IV 3.375 g (11/09/18 1325)   PRN Meds:.sodium chloride, alum & mag hydroxide-simeth, morphine injection, nitroGLYCERIN, ondansetron **OR** ondansetron (ZOFRAN) IV, oxyCODONE   Anti-infectives (From admission, onward)   Start     Dose/Rate Route Frequency Ordered Stop   11/09/18 1400  piperacillin-tazobactam (ZOSYN) IVPB 3.375 g     3.375 g 12.5 mL/hr over 240 Minutes Intravenous Every 8 hours 11/09/18 1239 11/10/18 1359   11/08/18 1400  piperacillin-tazobactam (ZOSYN) IVPB 3.375 g  Status:  Discontinued     3.375 g 12.5 mL/hr over 240 Minutes Intravenous Every 8 hours 11/08/18 0548 11/09/18 1239   11/05/18 1900  piperacillin-tazobactam (ZOSYN) IVPB 3.375 g  Status:  Discontinued     3.375 g 12.5 mL/hr over 240 Minutes Intravenous Every 8 hours 11/05/18 1839 11/08/18 0548       Objective:   Vitals:   11/09/18 1317 11/09/18 1324 11/09/18 1327 11/09/18 1339  BP: 133/67 129/73 129/73 130/71  Pulse: 89 63 60 63  Resp: 17 17 15 17   Temp:   98.8 F (37.1 C)   TempSrc:   Oral   SpO2: 100% 93% 94% 95%  Weight:      Height:        Wt Readings from Last 3 Encounters:  11/09/18 102.6 kg  10/31/16 107.7 kg  07/21/16 108.8 kg    Intake/Output Summary (Last 24 hours) at 11/09/2018 1729 Last data filed at 11/09/2018 1138 Gross per 24 hour  Intake 450 ml  Output 975 ml  Net -525 ml   Physical Exam  Gen:- Awake Alert, in no acute distress HEENT:- [place].AT, No sclera icterus Neck-Supple Neck,No JVD,.  Lungs-  CTAB , fair symmetrical air movement CV- S1, S2 normal, regular , prior sternotomy scar/AICD in situ Abd-  +ve B.Sounds, Abd Soft, appropriate postop tenderness extremity/Skin:- No  edema, pedal pulses present  Psych-affect is appropriate, oriented x3 Neuro-no new focal deficits, no tremors   Data  Review:   Micro Results Recent Results (from the past 240 hour(s))  SARS Coronavirus 2 (CEPHEID - Performed in [facility] hospital lab), Hosp Order     Status: None   Collection Time: 11/03/18 10:55 AM   Specimen: Nasopharyngeal Swab  Result Value Ref Range Status   SARS Coronavirus 2 NEGATIVE NEGATIVE Final    Comment: (NOTE) If result is NEGATIVE SARS-CoV-2 target nucleic acids are NOT DETECTED. The SARS-CoV-2 RNA is generally detectable in upper and lower  respiratory specimens during the acute phase of infection. The lowest  concentration of SARS-CoV-2 viral copies this assay can detect is 250  copies / mL. A negative result does not preclude SARS-CoV-2 infection  and should not be  used as the sole basis for treatment or other  patient management decisions.  A negative result may occur with  improper specimen collection / handling, submission of specimen other  than nasopharyngeal swab, presence of viral mutation(s) within the  areas targeted by this assay, and inadequate number of viral copies  (<250 copies / mL). A negative result must be combined with clinical  observations, patient history, and epidemiological information. If result is POSITIVE SARS-CoV-2 target nucleic acids are DETECTED. The SARS-CoV-2 RNA is generally detectable in upper and lower  respiratory specimens dur ing the acute phase of infection.  Positive  results are indicative of active infection with SARS-CoV-2.  Clinical  correlation with patient history and other diagnostic information is  necessary to determine patient infection status.  Positive results do  not rule out bacterial infection or co-infection with other viruses. If result is PRESUMPTIVE POSTIVE SARS-CoV-2 nucleic acids MAY BE PRESENT.   A presumptive positive result was obtained on the submitted specimen  and confirmed on repeat testing.  While 2019 novel coronavirus  (SARS-CoV-2) nucleic acids may be present in the submitted sample    additional confirmatory testing may be necessary for epidemiological  and / or clinical management purposes  to differentiate between  SARS-CoV-2 and other Sarbecovirus currently known to infect humans.  If clinically indicated additional testing with an alternate test  methodology [phone] is advised. The SARS-CoV-2 RNA is generally  detectable in upper and lower respiratory sp ecimens during the acute  phase of infection. The expected result is Negative. Fact Sheet for Patients:  BoilerBrush.com.cyhttps://www.fda.gov/media/136312/download Fact Sheet for Healthcare Providers: https://pope.com/https://www.fda.gov/media/136313/download This test is not yet approved or cleared by the [place] FDA and has been authorized for detection and/or diagnosis of SARS-CoV-2 by FDA under an Emergency Use Authorization (EUA).  This EUA will remain in effect (meaning this test can be used) for the duration of the COVID-19 declaration under Section 564(b)(1) of the Act, 21 U.S.C. section 360bbb-3(b)(1), unless the authorization is terminated or revoked sooner. Performed at [facility], [address] [place], [place] [address]   Surgical pcr screen     Status: None   Collection Time: 11/08/18 10:41 PM   Specimen: Nasal Mucosa; Nasal Swab  Result Value Ref Range Status   MRSA, PCR NEGATIVE NEGATIVE Final   Staphylococcus aureus NEGATIVE NEGATIVE Final    Comment: (NOTE) The Xpert SA Assay (FDA approved for NASAL specimens in patients 74 years of age and older), is one component of a comprehensive surveillance program. It is not intended to diagnose infection nor to guide or monitor treatment. Performed at [facility], [address] [place], [place] [address]     Radiology Reports Dg Cholangiogram Operative  Result Date: 11/09/2018 CLINICAL DATA:  73 year old male with a history of cholecystitis EXAM: INTRAOPERATIVE CHOLANGIOGRAM TECHNIQUE: Cholangiographic images from the C-arm fluoroscopic device were  submitted for interpretation post-operatively. Please see the procedural report for the amount of contrast and the fluoroscopy time utilized. COMPARISON:  Nuclear medicine November 05, 2018 FINDINGS: Surgical instruments project over the upper abdomen. There is cannulation of the cystic duct/gallbladder neck, with antegrade infusion of contrast. Caliber of the extrahepatic ductal system within normal limits. No definite filling defect within the extrahepatic ducts identified. Free flow of contrast across the ampulla. IMPRESSION: Intraoperative cholangiogram demonstrates extrahepatic biliary ducts of unremarkable caliber, with no definite filling defects identified. Free flow of contrast across the ampulla. Please refer to the dictated operative report for full  details of intraoperative findings and procedure Electronically Signed   By: Gilmer Mor D.O.   On: 11/09/2018 11:33   Nm Hepatobiliary Liver Func  Result Date: 11/05/2018 CLINICAL DATA:  73 year old male with a history of epigastric pain and CT evidence of cholelithiasis and possible cholecystitis EXAM: NUCLEAR MEDICINE HEPATOBILIARY IMAGING TECHNIQUE: Sequential images of the abdomen were obtained out to 60 minutes following intravenous administration of radiopharmaceutical. RADIOPHARMACEUTICALS:  5.1 mCi Tc-30m  Choletec IV COMPARISON:  CT November 04, 2018, ultrasound November 04, 2018 FINDINGS: Planar imaging of the abdomen performed after administration of nuclear medicine radiotracer. After 30 minutes of imaging, 3 mg IV morphine was administered with continuation of the planar imaging. 120 minutes of planar imaging acquired. Prompt uniform uptake and biliary excretion of activity by the liver is seen. Gallbladder activity is never visualized. Biliary activity passes into small bowel, consistent with patent common bile duct. IMPRESSION: HIDA study is positive for acute calculus cholecystitis. Electronically Signed   By: Gilmer Mor D.O.   On: 11/05/2018  16:03   US Abdomen Complete  Result Date: 11/04/2018 CLINICAL DATA:  Initial evaluation for acute abdominal pain for 2 days. EXAM: ABDOMEN ULTRASOUND COMPLETE COMPARISON:  None available. FINDINGS: Gallbladder: Multiple small stones seen within the gallbladder lumen, largest of which measures 5.5 mm. Gallbladder wall minimally thickened to 4 mm, which could be related incomplete distension. No free pericholecystic fluid. No sonographic Murphy sign elicited on exam. Common bile duct: Diameter: 4.8 mm Liver: No focal lesion identified. Mildly increased echogenicity within the hepatic parenchyma, suggesting steatosis. Portal vein is patent on color Doppler imaging with normal direction of blood flow towards the liver. IVC: No abnormality visualized. Pancreas: Not visualized. Spleen: Size and appearance within normal limits. Right Kidney: Length: 12.1 cm. Echogenicity within normal limits. No hydronephrosis. Few scattered simple cyst present, largest of which measures 4.8 x 4.7 x 4.4 cm at the upper pole. Left Kidney: Length: 13.7 mm. Echogenicity within normal limits. No mass or hydronephrosis visualized. Abdominal aorta: No aneurysm visualized. Other findings: None. IMPRESSION: 1. Cholelithiasis with minimal gallbladder wall thickening. No other sonographic features for acute cholecystitis. No biliary dilatation. 2. Mildly increased echogenicity within the patent parenchyma, suggesting steatosis. 3. Scattered simple right renal cysts measuring up to 4.8 cm. 4. Otherwise negative abdominal ultrasound. No other acute abnormality identified. Electronically Signed   By: Rise Mu M.D.   On: 11/04/2018 05:06   Ct Abdomen Pelvis W Contrast  Result Date: 11/04/2018 CLINICAL DATA:  Epigastric pain and tenderness. Stage 3 kidney disease. EXAM: CT ABDOMEN AND PELVIS WITH CONTRAST TECHNIQUE: Multidetector CT imaging of the abdomen and pelvis was performed using the standard protocol following bolus  administration of intravenous contrast. CONTRAST:  OMNIPAQUE IOHEXOL 300 MG/ML  SOLN COMPARISON:  Right upper quadrant ultrasound today. FINDINGS: Lower chest: Atelectasis right base. Cardiac pacer leads are present. Hepatobiliary: Liver and biliary tree are within normal. Mild to moderate cholelithiasis is present. Subtle stranding of the fat adjacent the gallbladder. No significant wall thickening. Pancreas: Normal. Spleen: Multiple calcified granulomas. Adrenals/Urinary Tract: Adrenal glands are normal. Kidneys are normal in size without hydronephrosis or nephrolithiasis. Right renal cyst measuring 5.6 cm. 1 cm right renal hypodensity too small to characterize but likely a cyst. Ureters and bladder are normal. Stomach/Bowel: Stomach and small bowel are normal. Appendix is not visualized. Mild diverticulosis of the colon. Vascular/Lymphatic: Mild calcified plaque over the abdominal aorta. No adenopathy. Reproductive: Normal. Other: No free fluid. Musculoskeletal: Mild degenerative change of  the spine and hips. IMPRESSION: Mild to moderate cholelithiasis. Subtle stranding of the pericholecystic fat which could be seen with mild acute cholecystitis. Note that the right upper quadrant ultrasound earlier today was also somewhat equivocal for acute cholecystitis. Consider HIDA scan for further evaluation. 5.6 cm right renal cyst and 1 cm right renal cortical hypodensity too small to characterize but likely a cyst. Recommend follow-up CT 6 months. Colonic diverticulosis. Aortic Atherosclerosis (ICD10-I70.0). Electronically Signed   By: Elberta Fortisaniel  Boyle M.D.   On: 11/04/2018 18:50   Dg Chest Port 1 View  Result Date: 11/03/2018 CLINICAL DATA:  Chest pain and shortness of breath. EXAM: PORTABLE CHEST 1 VIEW COMPARISON:  Chest x-ray dated 04/15/2016. FINDINGS: LEFT chest wall pacemaker/ICD apparatus appears stable in position. Median sternotomy wires appear intact. Lungs are clear. No pleural effusion or  pneumothorax seen. No acute appearing osseous abnormality. IMPRESSION: No active disease. No evidence of pneumonia or pulmonary edema. Electronically Signed   By: Bary RichardStan  Maynard M.D.   On: 11/03/2018 10:41     CBC Recent Labs  Lab 11/03/18 1044 11/06/18 1518 11/07/18 0329 11/08/18 0346 11/09/18 0558  WBC 8.9 10.6* 10.0 8.5 8.3  HGB 14.7 13.1 12.9* 13.5 13.9  HCT 41.9 37.4* 37.1* 38.5* 39.5  PLT 194 153 163 188 228  MCV 93.7 93.3 94.2 92.3 92.5  MCH 32.9 32.7 32.7 32.4 32.6  MCHC 35.1 35.0 34.8 35.1 35.2  RDW 11.9 11.8 11.9 11.7 11.6   Chemistries  Recent Labs  Lab 11/03/18 1044 11/03/18 1227 11/04/18 0755 11/06/18 1518 11/07/18 0329 11/08/18 0346 11/09/18 0558  NA 138  --  136  --  132* 134* 136  K 4.3  --  4.0  --  3.6 3.4* 4.2  CL 108  --  105  --  97* 97* 103  CO2 20*  --  24  --  26 24 22   GLUCOSE 151*  --  122*  --  104* 99 98  BUN 13  --  13  --  25* 24* 17  CREATININE 1.30*  --  1.19 1.89* 2.62* 2.42* 1.89*  CALCIUM 8.8*  --  8.7*  --  8.0* 8.4* 8.7*  MG  --   --   --   --  2.5*  --   --   AST  --  19  --   --  435* 173* 97*  ALT  --  28  --   --  530* 323* 231*  ALKPHOS  --  57  --   --  271* 219* 203*  BILITOT  --  1.0  --   --  5.1* 2.7* 1.5*   ------------------------------------------------------------------------------------------------------------------ No results for input(s): CHOL, HDL, LDLCALC, TRIG, CHOLHDL, LDLDIRECT in the last 72 hours.  Lab Results  Component Value Date   HGBA1C 5.6 04/16/2016   ------------------------------------------------------------------------------------------------------------------ No results for input(s): TSH, T4TOTAL, T3FREE, THYROIDAB in the last 72 hours.  Invalid input(s): FREET3 ------------------------------------------------------------------------------------------------------------------ No results for input(s): VITAMINB12, FOLATE, FERRITIN, TIBC, IRON, RETICCTPCT in the last 72 hours.  Coagulation  profile No results for input(s): INR, PROTIME in the last 168 hours.  No results for input(s): DDIMER in the last 72 hours.  Cardiac Enzymes No results for input(s): CKMB, TROPONINI, MYOGLOBIN in the last 168 hours.  Invalid input(s): CK ------------------------------------------------------------------------------------------------------------------    Component Value Date/Time   BNP 175.7 (H) 04/16/2016 [id number]   Shon Haleourage Ryenn Howeth M.D on 11/09/2018 at 5:29 PM  Go to [email or web address] - for contact info  [facility] - Office  [phone]

## 2018-11-10 ENCOUNTER — Encounter (HOSPITAL_COMMUNITY): Payer: Self-pay | Admitting: Surgery

## 2018-11-10 LAB — COMPREHENSIVE METABOLIC PANEL
ALT: 226 U/L — ABNORMAL HIGH (ref 0–44)
AST: 133 U/L — ABNORMAL HIGH (ref 15–41)
Albumin: 2.5 g/dL — ABNORMAL LOW (ref 3.5–5.0)
Alkaline Phosphatase: 186 U/L — ABNORMAL HIGH (ref 38–126)
Anion gap: 9 (ref 5–15)
BUN: 20 mg/dL (ref 8–23)
CO2: 23 mmol/L (ref 22–32)
Calcium: 8.4 mg/dL — ABNORMAL LOW (ref 8.9–10.3)
Chloride: 102 mmol/L (ref 98–111)
Creatinine, Ser: 1.84 mg/dL — ABNORMAL HIGH (ref 0.61–1.24)
GFR calc Af Amer: 41 mL/min — ABNORMAL LOW (ref >60)
GFR calc non Af Amer: 36 mL/min — ABNORMAL LOW (ref >60)
Glucose, Bld: 144 mg/dL — ABNORMAL HIGH (ref 70–99)
Potassium: 4.2 mmol/L (ref 3.5–5.1)
Sodium: 134 mmol/L — ABNORMAL LOW (ref 135–145)
Total Bilirubin: 1.3 mg/dL — ABNORMAL HIGH (ref 0.3–1.2)
Total Protein: 6 g/dL — ABNORMAL LOW (ref 6.5–8.1)

## 2018-11-10 LAB — CBC
HCT: 38.6 % — ABNORMAL LOW (ref 39.0–52.0)
Hemoglobin: 13.1 g/dL (ref 13.0–17.0)
MCH: 32.2 pg (ref 26.0–34.0)
MCHC: 33.9 g/dL (ref 30.0–36.0)
MCV: 94.8 fL (ref 80.0–100.0)
Platelets: 253 10*3/uL (ref 150–400)
RBC: 4.07 MIL/uL — ABNORMAL LOW (ref 4.22–5.81)
RDW: 11.5 % (ref 11.5–15.5)
WBC: 12.4 10*3/uL — ABNORMAL HIGH (ref 4.0–10.5)
nRBC: 0 % (ref 0.0–0.2)

## 2018-11-10 MED ORDER — ONDANSETRON HCL 4 MG PO TABS: 4.0000 mg | ORAL_TABLET | Freq: Four times a day (QID) | ORAL | 0 refills | Status: AC | PRN

## 2018-11-10 MED ORDER — CLOPIDOGREL BISULFATE 75 MG PO TABS: 75.0000 mg | ORAL_TABLET | Freq: Every day | ORAL | 2 refills | Status: AC

## 2018-11-10 MED ORDER — OXYCODONE HCL 5 MG PO TABS: 5.0000 mg | ORAL_TABLET | Freq: Four times a day (QID) | ORAL | 0 refills | Status: AC | PRN

## 2018-11-10 MED ORDER — FUROSEMIDE 40 MG PO TABS: 20.0000 mg | ORAL_TABLET | Freq: Every day | ORAL | 1 refills | Status: AC

## 2018-11-10 MED ORDER — ACETAMINOPHEN 500 MG PO TABS: 1000.0000 mg | ORAL_TABLET | Freq: Four times a day (QID) | ORAL | Status: AC | PRN

## 2018-11-10 MED ORDER — OXYCODONE HCL 5 MG PO TABS: 5.0000 mg | ORAL_TABLET | Freq: Four times a day (QID) | ORAL | 0 refills | Status: DC | PRN

## 2018-11-10 MED ORDER — VALSARTAN 40 MG PO TABS: 40.0000 mg | ORAL_TABLET | Freq: Every day | ORAL | 1 refills | Status: AC

## 2018-11-10 MED ORDER — ASPIRIN EC 81 MG PO TBEC: 81.0000 mg | DELAYED_RELEASE_TABLET | Freq: Every day | ORAL | 3 refills | Status: DC

## 2018-11-10 NOTE — Progress Notes (Signed)
Progress Note  Patient Name: Stephen Macdonald Date of Encounter: 11/10/2018  Primary Cardiologist: [facility] Subjective   No CP   No SOB   Inpatient Medications    Scheduled Meds: . acetaminophen  1,000 mg Oral Q6H  . cholecalciferol  1,000 Units Oral QHS  . enoxaparin (LOVENOX) injection  40 mg Subcutaneous Q24H  . ezetimibe  10 mg Oral Daily  . metoprolol succinate  25 mg Oral QPM  . pantoprazole  40 mg Oral Daily  . tamsulosin  0.4 mg Oral q morning - 10a   Continuous Infusions: . sodium chloride Stopped (11/06/18 0257)  . piperacillin-tazobactam (ZOSYN)  IV 3.375 g (11/10/18 0531)   PRN Meds: sodium chloride, alum & mag hydroxide-simeth, hydrALAZINE, morphine injection, nitroGLYCERIN, ondansetron **OR** ondansetron (ZOFRAN) IV, oxyCODONE   Vital Signs    Vitals:   11/09/18 1339 11/09/18 2022 11/10/18 0525 11/10/18 0636  BP: 130/71 112/74 108/73   Pulse: 63 60 67   Resp: 17 (!) 21 18   Temp:  98.7 F (37.1 C) 98.3 F (36.8 C)   TempSrc:  Oral Oral   SpO2: 95% 93% 93%   Weight:    102.3 kg  Height:        Intake/Output Summary (Last 24 hours) at 11/10/2018 0752 Last data filed at 11/10/2018 0600 Gross per 24 hour  Intake 720 ml  Output 600 ml  Net 120 ml   Last 3 Weights 11/10/2018 11/09/2018 11/09/2018  Weight (lbs) 225 lb 8 oz 226 lb 3.2 oz 226 lb 3.2 oz  Weight (kg) 102.286 kg 102.604 kg 102.604 kg      Telemetry    SR - Personally Reviewed  ECG    No new to review - Personally Reviewed  Physical Exam  Pleasant older WM GEN: No acute distress.   Neck:  JVP is not elevated Cardiac: RRR, no murmurs, rubs, or gallops.  Respiratory: Clear to auscultation bilaterally.  ABd   Sl distended   Supple   Mild tenderness  MS: No edema; No deformity. Neuro:  Nonfocal  Psych: Normal affect   Labs    High Sensitivity Troponin:   Recent Labs  Lab 11/03/18 1044 11/03/18 1227  TROPONINIHS 9 10      Cardiac EnzymesNo results for input(s): TROPONINI  in the last 168 hours. No results for input(s): TROPIPOC in the last 168 hours.   Chemistry Recent Labs  Lab 11/08/18 0346 11/09/18 0558 11/10/18 0427  NA 134* 136 134*  K 3.4* 4.2 4.2  CL 97* 103 102  CO2 24 22 23   GLUCOSE 99 98 144*  BUN 24* 17 20  CREATININE 2.42* 1.89* 1.84*  CALCIUM 8.4* 8.7* 8.4*  PROT 5.7* 6.0* 6.0*  ALBUMIN 2.5* 2.6* 2.5*  AST 173* 97* 133*  ALT 323* 231* 226*  ALKPHOS 219* 203* 186*  BILITOT 2.7* 1.5* 1.3*  GFRNONAA 26* 34* 36*  GFRAA 30* 40* 41*  ANIONGAP 13 11 9      Hematology Recent Labs  Lab 11/08/18 0346 11/09/18 0558 11/10/18 0427  WBC 8.5 8.3 12.4*  RBC 4.17* 4.27 4.07*  HGB 13.5 13.9 13.1  HCT 38.5* 39.5 38.6*  MCV 92.3 92.5 94.8  MCH 32.4 32.6 32.2  MCHC 35.1 35.2 33.9  RDW 11.7 11.6 11.5  PLT 188 228 253    BNPNo results for input(s): BNP, PROBNP in the last 168 hours.   DDimer No results for input(s): DDIMER in the last 168 hours.   Radiology  Dg Cholangiogram Operative  Result Date: 11/09/2018 CLINICAL DATA:  73 year old male with a history of cholecystitis EXAM: INTRAOPERATIVE CHOLANGIOGRAM TECHNIQUE: Cholangiographic images from the C-arm fluoroscopic device were submitted for interpretation post-operatively. Please see the procedural report for the amount of contrast and the fluoroscopy time utilized. COMPARISON:  Nuclear medicine November 05, 2018 FINDINGS: Surgical instruments project over the upper abdomen. There is cannulation of the cystic duct/gallbladder neck, with antegrade infusion of contrast. Caliber of the extrahepatic ductal system within normal limits. No definite filling defect within the extrahepatic ducts identified. Free flow of contrast across the ampulla. IMPRESSION: Intraoperative cholangiogram demonstrates extrahepatic biliary ducts of unremarkable caliber, with no definite filling defects identified. Free flow of contrast across the ampulla. Please refer to the dictated operative report for full details  of intraoperative findings and procedure Electronically Signed   By: Gilmer Mor D.O.   On: 11/09/2018 11:33    Cardiac Studies   TTE: 11/03/18  IMPRESSIONS    1. The left ventricle has severely reduced systolic function, with an ejection fraction of 20-25%. The cavity size was severely dilated. Left ventricular diastolic Doppler parameters are consistent with pseudonormalization. Left ventricular diffuse  hypokinesis.  2. The right ventricle has normal systolic function. The cavity was normal.  3. Left atrial size was mildly dilated.  4. The mitral valve is grossly normal. There is severe mitral annular calcification present.  5. The tricuspid valve is grossly normal.  6. The aortic valve is tricuspid. Mild thickening of the aortic valve. No stenosis of the aortic valve.  7. The aortic root is normal in size and structure.  8. Severe global reduction in LV systolic function; moderate diastolic dysfunction; severe LVE; mild MR and TR; mild LAE.  9. The inferior vena cava was normal in size with <50% respiratory variability.  Patient Profile     73 y.o. male with CAD and prior CABG in 1997, multiple NSTEMI's and coronary interventions, last in 03/2016 followed by the [facility], presents with CP that awakened him from sleep. Consult on 7/18 by Dr. Rennis Golden, now planned to under surgery and asked for preop evaluation.   Assessment & Plan    1 CAD, s/p CABG in 1997.   No symptoms of angina   He was on plavix prior to admit   I would resume when OK from surgerical standpoint    The pt has an appt this coming Monday (2 days) at the [facility]   They can reasssess  Long term needs   2. Chronic systolic CHF  Pt s/p AICD: known EF of 25-35%. Device is followed by cardiology at the [facility]. He has never been shocked  Reports he is close to [facility] and has an appt in the coming weeks for follow up. He is curretnly off of valsartan due to bump in Cr   Cr is improving but still not at baseline   With this  would hold   Labs can be reassessed at [place]      3. Cholecystitis: POD 1  Pt is POD 1     4. Possible Afib?:  None demonstrated   Interrogation of device shows no arrhythmia   OK to d/c from cardiac standpoint   Follow up at [facility]   For questions or updates, please contact [facility] Please consult [email or web address] for contact info under    Signed, Dietrich Pates, MD  11/10/2018, 7:52 AM

## 2018-11-10 NOTE — Discharge Summary (Signed)
Stephen Macdonald, is a 73 y.o. male  DOB January 24, 1946  MRN [id number].  Admission date:  11/03/2018  Admitting Physician  Merton Border, MD  Discharge Date:  11/10/2018   Primary MD  Lucia Gaskins, MD  Recommendations for primary care physician for things to follow:   1) okay to restart Plavix/clopidogrel on Sunday, 11/11/2018 2) you are already taking Plavix and aspirin which can increase your risk for bleeding so Avoid ibuprofen/Advil/Aleve/Motrin/Goody Powders/Naproxen/BC powders/Meloxicam/Diclofenac/Indomethacin and other Nonsteroidal anti-inflammatory medications as these will make you more likely to bleed and can cause stomach ulcers, can also cause Kidney problems.  3) continue to hold Diovan/valsartan until after your BMP/kidney blood test on Monday, 11/12/2018 after your visit with the cardiologist to make sure your kidney function has improved enough to restart this medication 4)Please keep your appointment with your cardiologist at the [facility] as scheduled for Monday, 11/12/2018 5) please follow-up with the general surgery team as scheduled and as advised for your post gallbladder removal surgery follow-up   Admission Diagnosis  Nonspecific chest pain [R07.9]   Discharge Diagnosis  Nonspecific chest pain [R07.9]   Active Problems:   Cardiomyopathy, ischemic   HFrEF (heart failure with reduced ejection fraction) (HCC)/Combined Systolic and Diastolic CHF/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 01/2015 Echo: EF 35-30%; b. 03/2016 Echo: Ef 20%, Gr1DD, mid-apical anterior, mid anteroseptal,  apical inferior, basal inferolateral, mid anterolateral, and apical AK, basal anteroseptal and mid inferior severe HK, midlly dil LA, mild TR.   CKD (chronic kidney disease), stage III ([place]) 04/15/2016   Coronary artery disease    a. 1997 s/p CABG;  b. 01/2015 MI/PCI: G->PDA;  c. 03/2016 NSTEMI/PCI: LM 20, LAD 124m D2 80, RI 60, LCX ok, OM1 90, RCA 60p, 1037mRPAV 80, G->RPDA 15p ISR, 90d (3.5x28 Promus Premier DES), LIMA->OM1->dLAD nl.   High cholesterol    Hypertension    Ischemic cardiomyopathy    a. 01/2015 Echo: EF 25-30%; b. s/p AICD;  c. 03/2016 Echo: Ef 20%, Gr1DD.   Smoker     Past Surgical History:  Procedure Laterality Date   CARDIAC CATHETERIZATION N/A 02/12/2015   Procedure: Left Heart Cath and Coronary Angiography;  Surgeon: JaJettie BoozeMD;  Location[facility];  Service: Cardiovascular;  Laterality: N/A;   CARDIAC CATHETERIZATION N/A 02/12/2015   Procedure: Coronary Stent Intervention;  Surgeon: JaJettie BoozeMD;  Location: [facility];  Service: Cardiovascular;  Laterality: N/A;   CARDIAC CATHETERIZATION  02/12/2015   Procedure: Left Heart Cath and Cors/Grafts Angiography;  Surgeon: JaJettie BoozeMD;  Location: [facility];  Service:  Cardiovascular;;   CARDIAC CATHETERIZATION N/A 04/16/2016   Procedure: LEFT HEART CATH AND CORS/GRAFTS ANGIOGRAPHY;  Surgeon: Nelva Bush, MD;  Location: [facility];  Service: Cardiovascular;  Laterality: N/A;   CARDIAC CATHETERIZATION N/A 04/16/2016   Procedure: Coronary Stent Intervention;  Surgeon: Nelva Bush, MD;  Location: [facility];  Service: Cardiovascular;  Laterality: N/A;   CARDIAC DEFIBRILLATOR PLACEMENT     CHOLECYSTECTOMY N/A 11/09/2018   Procedure: LAPAROSCOPIC CHOLECYSTECTOMY WITH INTRAOPERATIVE CHOLANGIOGRAM;  Surgeon: Donnie Mesa, MD;  Location: [facility];  Service: General;  Laterality: N/A;   CORONARY ARTERY BYPASS GRAFT     x 3   CORONARY  STENT PLACEMENT     PACEMAKER INSERTION         HPI  from the history and physical done on the day of admission:    Stephen Macdonald  is a 73 y.o. male, with past medical history significant for systolic congestive heart failure at 20% coronary artery disease status post CABG in 1997 followed by stent placement in 2016 and 2017 at our facility presenting here with 1 day history of chest pain at the central chest/ epigastric area radiating to the back associated with nausea but no vomiting, no shortness of breath or cold sweats.  The pain has been constant since early this a.m. waxing and waning.  Partially relieved by nitroglycerin sublingual.  The pain started at around 2 and went up to 9 and now is at 6. Emergency room work-up showed mild renal insufficiency, stage II troponin 9/10. EKG shows normal sinus rhythm at 83 bpm with PVCs and old anterior MI. Echocardiogram done in 2017 showed ejection fraction of 20% Cardiology was consulted since patient is still having ongoing chest pain    Hospital Course:    Brief Summary:- History of CAD/CABG (1997), multiple NSTEMI's, last stents 82/4235,  Systolic CHF/ICM with EF of 20 to 25%, admitted on 11/03/2018 with epigastric/chest pain and found to have acute calculus cholecystitis,  General surgery  Consulted, pt is s/p  lap chole with neg IOC on 11/09/18   A/p 1)Acute calculus cholecystitis---  pt is s/p  lap chole with neg IOC on 11/09/18 -- last dose of Plavix is 11/05/2018 (4 pm) ----CT abdomen and pelvis and HIDA scan report noted --Treated with IV Zosyn  --- Suspect that patient has passed a CBD stone --- LFTs are trending back down-- ----AST is down to 133 from a peak of 435, ALT 226 from a peak of 530, alk phos 183 from a peak of 271, total bili 1.3 from a peak of 5.1  2)HFrEF/ICM s/p AICD--patient with ischemic cardio- myopathy/combined systolic and diastolic dysfunction CHF with EF of 20 to 25 % as per echo from 11/04/2018-- AICD  battery apparently expiring October 2020,   [employer] device interrogation reveals no significant arrhythmia, specifically no atrial fibrillation =--- Patient usually follows with [facility] at the [facility], his AICD has not gone off previously -May restart valsartan 40 mg daily after BMP recheck on 11/12/2018, may restart Lasix 20 mg daily-on 11/12/2018 after BMP recheck - 3)H/o CAD--prior MIs, prior CABG, prior angioplasty and stent placement, currently chest pain-free, continue aspirin 81 mg daily, okay to restart Lipitor  continue Zetia 10 mg daily, continue Toprol-XL 25 mg daily  4)AKI----acute kidney injury due to poor oral intake resulting in dehydration and transient hypotension in the setting of acute cholecystitis,    creatinine on admission=1.30  ,   baseline creatinine = presumed previously normal    ,  creatinine is now= 1.84 (Peak was 2.62)     , renally adjust medications, avoid nephrotoxic agents/dehydration/hypotension  -May restart valsartan 40 mg daily after BMP recheck on 11/12/2018, may restart Lasix 20 mg daily-on 11/12/2018 after BMP recheck  5)HTN-stable, May restart valsartan 40 mg daily after BMP recheck on 11/12/2018, may restart Lasix 20 mg daily-on 11/12/2018 after BMP recheck  Code Status : Full  Family Communication:   NA (patient is alert, awake and coherent) Discussed with pt's wife-- at [phone]  Disposition Plan  : home  Consults  : Cardiology/general surgery/GI service   Discharge Condition: stable  Follow UP  [facility] Follow up on 11/22/2018.   Specialty: General Surgery Why: 08/06 at 2:30pm a provider will call you for your telehealth appointment. Please send photos of your incisions to [email or web address] 2 days prior and include your name and DOB in the subject line. Call with any questions or conerns         Contact information: [place] [phone]          Diet and Activity recommendation:  As advised  Discharge Instructions    Discharge Instructions    Call MD for:  difficulty breathing, headache or visual disturbances   Complete by: As directed    Call MD for:  persistant dizziness or light-headedness   Complete by: As directed    Call MD for:  persistant nausea and vomiting   Complete by: As directed    Call MD for:  severe uncontrolled pain   Complete by: As directed    Call MD for:  temperature >100.4   Complete by: As directed    Diet - low sodium heart healthy   Complete by: As directed    Discharge instructions   Complete by: As directed    1) okay to restart Plavix/clopidogrel on Sunday, 11/11/2018 2) you are already taking Plavix and aspirin which can increase your risk for bleeding so Avoid ibuprofen/Advil/Aleve/Motrin/Goody Powders/Naproxen/BC powders/Meloxicam/Diclofenac/Indomethacin and other Nonsteroidal anti-inflammatory medications as these will make you more likely to bleed and can cause stomach ulcers, can also cause Kidney problems.  3) continue to hold Diovan/valsartan until after your BMP/kidney blood test on Monday, 11/12/2018 after your visit with the cardiologist to make sure your kidney function has improved enough to restart this medication 4)Please keep your appointment with your [facility] at the [facility] as scheduled for Monday, 11/12/2018 5) please follow-up with the general surgery team as scheduled and as advised for your post gallbladder removal surgery follow-up   Increase activity slowly   Complete by: As directed         Discharge Medications     Allergies as of 11/10/2018      Reactions   Lisinopril Cough   Codeine Other (See Comments)   Tingling skin. "makes my skin crawl"      Medication List    STOP taking these medications   Potassium Chloride ER 20 MEQ Tbcr   ticagrelor 90 MG Tabs tablet Commonly known as: BRILINTA      TAKE these medications   acetaminophen 500 MG tablet Commonly known as: TYLENOL Take 2 tablets (1,000 mg total) by mouth every 6 (six) hours as needed for mild pain.   aspirin EC 81 MG tablet Take 1 tablet (81 mg total) by mouth daily with breakfast. What changed: when to take this   atorvastatin 80 MG tablet Commonly known as:  LIPITOR Take 1 tablet (80 mg total) by mouth daily. What changed: when to take this   cholecalciferol 1000 units tablet Commonly known as: VITAMIN D Take 1,000 Units by mouth at bedtime.   clopidogrel 75 MG tablet Commonly known as: PLAVIX Take 1 tablet (75 mg total) by mouth daily. Start on Sunday 11/11/18 What changed: additional instructions   ezetimibe 10 MG tablet Commonly known as: ZETIA Take 10 mg by mouth daily.   furosemide 40 MG tablet Commonly known as: Lasix Take 0.5 tablets (20 mg total) by mouth daily. Start after BMP test recheck on Monday 11/12/18 What changed:   how much to take  additional instructions   metoprolol succinate 25 MG 24 hr tablet Commonly known as: TOPROL-XL Take 25 mg by mouth every evening.   nitroGLYCERIN 0.4 MG SL tablet Commonly known as: NITROSTAT Place 1 tablet (0.4 mg total) under the tongue every 5 (five) minutes as needed for chest pain.   ondansetron 4 MG tablet Commonly known as: ZOFRAN Take 1 tablet (4 mg total) by mouth every 6 (six) hours as needed for nausea.   oxyCODONE 5 MG immediate release tablet Commonly known as: Oxy IR/ROXICODONE Take 1 tablet (5 mg total) by mouth every 6 (six) hours as needed for severe pain.   tamsulosin 0.4 MG Caps capsule Commonly known as: FLOMAX Take 0.4 mg by mouth every morning.   valsartan 40 MG tablet Commonly known as: DIOVAN Take 1 tablet (40 mg total) by mouth at bedtime. Start after BMP test recheck on Monday 11/12/18 What changed: additional instructions       Major procedures and Radiology Reports - PLEASE review detailed and final reports for  all details, in brief -   Dg Cholangiogram Operative  Result Date: 11/09/2018 CLINICAL DATA:  73 year old male with a history of cholecystitis EXAM: INTRAOPERATIVE CHOLANGIOGRAM TECHNIQUE: Cholangiographic images from the C-arm fluoroscopic device were submitted for interpretation post-operatively. Please see the procedural report for the amount of contrast and the fluoroscopy time utilized. COMPARISON:  Nuclear medicine November 05, 2018 FINDINGS: Surgical instruments project over the upper abdomen. There is cannulation of the cystic duct/gallbladder neck, with antegrade infusion of contrast. Caliber of the extrahepatic ductal system within normal limits. No definite filling defect within the extrahepatic ducts identified. Free flow of contrast across the ampulla. IMPRESSION: Intraoperative cholangiogram demonstrates extrahepatic biliary ducts of unremarkable caliber, with no definite filling defects identified. Free flow of contrast across the ampulla. Please refer to the dictated operative report for full details of intraoperative findings and procedure Electronically Signed   By: Corrie Mckusick D.O.   On: 11/09/2018 11:33   Nm Hepatobiliary Liver Func  Result Date: 11/05/2018 CLINICAL DATA:  73 year old male with a history of epigastric pain and CT evidence of cholelithiasis and possible cholecystitis EXAM: NUCLEAR MEDICINE HEPATOBILIARY IMAGING TECHNIQUE: Sequential images of the abdomen were obtained out to 60 minutes following intravenous administration of radiopharmaceutical. RADIOPHARMACEUTICALS:  5.1 mCi Tc-3m Choletec IV COMPARISON:  CT November 04, 2018, ultrasound November 04, 2018 FINDINGS: Planar imaging of the abdomen performed after administration of nuclear medicine radiotracer. After 30 minutes of imaging, 3 mg IV morphine was administered with continuation of the planar imaging. 120 minutes of planar imaging acquired. Prompt uniform uptake and biliary excretion of activity by the liver is seen.  Gallbladder activity is never visualized. Biliary activity passes into small bowel, consistent with patent common bile duct. IMPRESSION: HIDA study is positive for acute calculus cholecystitis. Electronically Signed   By:  Corrie Mckusick D.O.   On: 11/05/2018 16:03   US Abdomen Complete  Result Date: 11/04/2018 CLINICAL DATA:  Initial evaluation for acute abdominal pain for 2 days. EXAM: ABDOMEN ULTRASOUND COMPLETE COMPARISON:  None available. FINDINGS: Gallbladder: Multiple small stones seen within the gallbladder lumen, largest of which measures 5.5 mm. Gallbladder wall minimally thickened to 4 mm, which could be related incomplete distension. No free pericholecystic fluid. No sonographic Murphy sign elicited on exam. Common bile duct: Diameter: 4.8 mm Liver: No focal lesion identified. Mildly increased echogenicity within the hepatic parenchyma, suggesting steatosis. Portal vein is patent on color Doppler imaging with normal direction of blood flow towards the liver. IVC: No abnormality visualized. Pancreas: Not visualized. Spleen: Size and appearance within normal limits. Right Kidney: Length: 12.1 cm. Echogenicity within normal limits. No hydronephrosis. Few scattered simple cyst present, largest of which measures 4.8 x 4.7 x 4.4 cm at the upper pole. Left Kidney: Length: 13.7 mm. Echogenicity within normal limits. No mass or hydronephrosis visualized. Abdominal aorta: No aneurysm visualized. Other findings: None. IMPRESSION: 1. Cholelithiasis with minimal gallbladder wall thickening. No other sonographic features for acute cholecystitis. No biliary dilatation. 2. Mildly increased echogenicity within the patent parenchyma, suggesting steatosis. 3. Scattered simple right renal cysts measuring up to 4.8 cm. 4. Otherwise negative abdominal ultrasound. No other acute abnormality identified. Electronically Signed   By: Jeannine Boga M.D.   On: 11/04/2018 05:06   Ct Abdomen Pelvis W Contrast  Result  Date: 11/04/2018 CLINICAL DATA:  Epigastric pain and tenderness. Stage 3 kidney disease. EXAM: CT ABDOMEN AND PELVIS WITH CONTRAST TECHNIQUE: Multidetector CT imaging of the abdomen and pelvis was performed using the standard protocol following bolus administration of intravenous contrast. CONTRAST:  185m OMNIPAQUE IOHEXOL 300 MG/ML  SOLN COMPARISON:  Right upper quadrant ultrasound today. FINDINGS: Lower chest: Atelectasis right base. Cardiac pacer leads are present. Hepatobiliary: Liver and biliary tree are within normal. Mild to moderate cholelithiasis is present. Subtle stranding of the fat adjacent the gallbladder. No significant wall thickening. Pancreas: Normal. Spleen: Multiple calcified granulomas. Adrenals/Urinary Tract: Adrenal glands are normal. Kidneys are normal in size without hydronephrosis or nephrolithiasis. Right renal cyst measuring 5.6 cm. 1 cm right renal hypodensity too small to characterize but likely a cyst. Ureters and bladder are normal. Stomach/Bowel: Stomach and small bowel are normal. Appendix is not visualized. Mild diverticulosis of the colon. Vascular/Lymphatic: Mild calcified plaque over the abdominal aorta. No adenopathy. Reproductive: Normal. Other: No free fluid. Musculoskeletal: Mild degenerative change of the spine and hips. IMPRESSION: Mild to moderate cholelithiasis. Subtle stranding of the pericholecystic fat which could be seen with mild acute cholecystitis. Note that the right upper quadrant ultrasound earlier today was also somewhat equivocal for acute cholecystitis. Consider HIDA scan for further evaluation. 5.6 cm right renal cyst and 1 cm right renal cortical hypodensity too small to characterize but likely a cyst. Recommend follow-up CT 6 months. Colonic diverticulosis. Aortic Atherosclerosis (ICD10-I70.0). Electronically Signed   By: DMarin OlpM.D.   On: 11/04/2018 18:50   Dg Chest Port 1 View  Result Date: 11/03/2018 CLINICAL DATA:  Chest pain and shortness  of breath. EXAM: PORTABLE CHEST 1 VIEW COMPARISON:  Chest x-ray dated 04/15/2016. FINDINGS: LEFT chest wall pacemaker/ICD apparatus appears stable in position. Median sternotomy wires appear intact. Lungs are clear. No pleural effusion or pneumothorax seen. No acute appearing osseous abnormality. IMPRESSION: No active disease. No evidence of pneumonia or pulmonary edema. Electronically Signed   By: SRoxy HorsemanD.  On: 11/03/2018 10:41    Micro Results   Recent Results (from the past 240 hour(s))  SARS Coronavirus 2 (CEPHEID - Performed in [facility] hospital lab), Hosp Order     Status: None   Collection Time: 11/03/18 10:55 AM   Specimen: Nasopharyngeal Swab  Result Value Ref Range Status   SARS Coronavirus 2 NEGATIVE NEGATIVE Final    Comment: (NOTE) If result is NEGATIVE SARS-CoV-2 target nucleic acids are NOT DETECTED. The SARS-CoV-2 RNA is generally detectable in upper and lower  respiratory specimens during the acute phase of infection. The lowest  concentration of SARS-CoV-2 viral copies this assay can detect is 250  copies / mL. A negative result does not preclude SARS-CoV-2 infection  and should not be used as the sole basis for treatment or other  patient management decisions.  A negative result may occur with  improper specimen collection / handling, submission of specimen other  than nasopharyngeal swab, presence of viral mutation(s) within the  areas targeted by this assay, and inadequate number of viral copies  (<250 copies / mL). A negative result must be combined with clinical  observations, patient history, and epidemiological information. If result is POSITIVE SARS-CoV-2 target nucleic acids are DETECTED. The SARS-CoV-2 RNA is generally detectable in upper and lower  respiratory specimens dur ing the acute phase of infection.  Positive  results are indicative of active infection with SARS-CoV-2.  Clinical  correlation with patient history and other diagnostic  information is  necessary to determine patient infection status.  Positive results do  not rule out bacterial infection or co-infection with other viruses. If result is PRESUMPTIVE POSTIVE SARS-CoV-2 nucleic acids MAY BE PRESENT.   A presumptive positive result was obtained on the submitted specimen  and confirmed on repeat testing.  While 2019 novel coronavirus  (SARS-CoV-2) nucleic acids may be present in the submitted sample  additional confirmatory testing may be necessary for epidemiological  and / or clinical management purposes  to differentiate between  SARS-CoV-2 and other Sarbecovirus currently known to infect humans.  If clinically indicated additional testing with an alternate test  methodology [phone]) is advised. The SARS-CoV-2 RNA is generally  detectable in upper and lower respiratory sp ecimens during the acute  phase of infection. The expected result is Negative. Fact Sheet for Patients:  StrictlyIdeas.no Fact Sheet for Healthcare Providers: BankingDealers.co.za This test is not yet approved or cleared by the [place] FDA and has been authorized for detection and/or diagnosis of SARS-CoV-2 by FDA under an Emergency Use Authorization (EUA).  This EUA will remain in effect (meaning this test can be used) for the duration of the COVID-19 declaration under Section 564(b)(1) of the Act, 21 U.S.C. section 360bbb-3(b)(1), unless the authorization is terminated or revoked sooner. Performed at [facility], [place] [address] [place], [place] [address]   Surgical pcr screen     Status: None   Collection Time: 11/08/18 10:41 PM   Specimen: Nasal Mucosa; Nasal Swab  Result Value Ref Range Status   MRSA, PCR NEGATIVE NEGATIVE Final   Staphylococcus aureus NEGATIVE NEGATIVE Final    Comment: (NOTE) The Xpert SA Assay (FDA approved for NASAL specimens in patients 49 years of age and older), is one component of a  comprehensive surveillance program. It is not intended to diagnose infection nor to guide or monitor treatment. Performed at [facility], [place] [address] [place], [place] [address]        Today   Subjective  Stephen Macdonald today has no new complaints, --- Voiding well, tolerating oral intake well, --Passing gas, no BM yet          Patient has been seen and examined prior to discharge   Objective   Blood pressure 108/73, pulse 67, temperature 98.3 F (36.8 C), temperature source Oral, resp. rate 18, height 6' (1.829 m), weight 102.3 kg, SpO2 93 %.   Intake/Output Summary (Last 24 hours) at 11/10/2018 0928 Last data filed at 11/10/2018 0600 Gross per 24 hour  Intake 720 ml  Output 600 ml  Net 120 ml    Exam  Gen:- Awake Alert, in no acute distress HEENT:- [place].AT, No sclera icterus Neck-Supple Neck,No JVD,.  Lungs-  CTAB , fair symmetrical air movement CV- S1, S2 normal, regular , prior sternotomy scar/AICD in situ Abd-  +ve B.Sounds, Abd Soft, appropriate postop tenderness extremity/Skin:- No  edema, pedal pulses present  Psych-affect is appropriate, oriented x3 Neuro-no new focal deficits, no tremors   Data Review   CBC w Diff:  Lab Results  Component Value Date   WBC 12.4 (H) 11/10/2018   HGB 13.1 11/10/2018   HCT 38.6 (L) 11/10/2018   PLT 253 11/10/2018   LYMPHOPCT 28 11/10/2007   MONOPCT 8 11/10/2007   EOSPCT 4 11/10/2007   BASOPCT 0 11/10/2007    CMP:  Lab Results  Component Value Date   NA 134 (L) 11/10/2018   NA 141 04/20/2016   K 4.2 11/10/2018   CL 102 11/10/2018   CO2 23 11/10/2018   BUN 20 11/10/2018   BUN 15 04/20/2016   CREATININE 1.84 (H) 11/10/2018   PROT 6.0 (L) 11/10/2018   ALBUMIN 2.5 (L) 11/10/2018   BILITOT 1.3 (H) 11/10/2018   ALKPHOS 186 (H) 11/10/2018   AST 133 (H) 11/10/2018   ALT 226 (H) 11/10/2018  .   Total Discharge time is about 33 minutes  Roxan Hockey M.D on 11/10/2018 at 9:28 AM  Go to  [email or web address] -  for contact info  [facility] - Office  [phone]

## 2018-11-10 NOTE — Progress Notes (Signed)
[facility] Progress Note  1 Day Post-Op  Subjective: CC: soreness Patient sore in epigastrium and R side mostly with turning and deep inspiration. Denies nausea, tolerating diet. No flatus yet. Has scheduled appointment with PCP Monday.   Objective: Vital signs in last 24 hours: Temp:  [97.8 F (36.6 C)-98.8 F (37.1 C)] 98.3 F (36.8 C) (07/25 0525) Pulse Rate:  [60-89] 67 (07/25 0525) Resp:  [13-25] 18 (07/25 0525) BP: (108-133)/(62-79) 108/73 (07/25 0525) SpO2:  [92 %-100 %] 93 % (07/25 0525) Arterial Line BP: (142)/(59) 142/59 (07/24 1150) Weight:  [102.3 kg] 102.3 kg (07/25 0636) Last BM Date: 11/09/18  Intake/Output from previous day: 07/24 0701 - 07/25 0700 In: 720 [P.O.:220; I.V.:400; IV Piggyback:100] Out: 600 [Urine:550; Blood:50] Intake/Output this shift: No intake/output data recorded.  PE: Gen:  Alert, NAD, pleasant Card:  Regular rate and rhythm, pedal pulses 2+ BL Pulm:  Normal effort, clear to auscultation bilaterally Abd: Soft, non-tender, non-distended, +BS, no HSM, incisions C/D/I Skin: warm and dry, no rashes  Psych: A&Ox3   Lab Results:  Recent Labs    11/09/18 0558 11/10/18 0427  WBC 8.3 12.4*  HGB 13.9 13.1  HCT 39.5 38.6*  PLT 228 253   BMET Recent Labs    11/09/18 0558 11/10/18 0427  NA 136 134*  K 4.2 4.2  CL 103 102  CO2 22 23  GLUCOSE 98 144*  BUN 17 20  CREATININE 1.89* 1.84*  CALCIUM 8.7* 8.4*   PT/INR No results for input(s): LABPROT, INR in the last 72 hours. CMP     Component Value Date/Time   NA 134 (L) 11/10/2018 0427   NA 141 04/20/2016 1246   K 4.2 11/10/2018 0427   CL 102 11/10/2018 0427   CO2 23 11/10/2018 0427   GLUCOSE 144 (H) 11/10/2018 0427   BUN 20 11/10/2018 0427   BUN 15 04/20/2016 1246   CREATININE 1.84 (H) 11/10/2018 0427   CALCIUM 8.4 (L) 11/10/2018 0427   PROT 6.0 (L) 11/10/2018 0427   ALBUMIN 2.5 (L) 11/10/2018 0427   AST 133 (H) 11/10/2018 0427   ALT 226 (H) 11/10/2018 0427    ALKPHOS 186 (H) 11/10/2018 0427   BILITOT 1.3 (H) 11/10/2018 0427   GFRNONAA 36 (L) 11/10/2018 0427   GFRAA 41 (L) 11/10/2018 0427   Lipase     Component Value Date/Time   LIPASE 29 11/07/2018 0329       Studies/Results: Dg Cholangiogram Operative  Result Date: 11/09/2018 CLINICAL DATA:  73 year old male with a history of cholecystitis EXAM: INTRAOPERATIVE CHOLANGIOGRAM TECHNIQUE: Cholangiographic images from the C-arm fluoroscopic device were submitted for interpretation post-operatively. Please see the procedural report for the amount of contrast and the fluoroscopy time utilized. COMPARISON:  Nuclear medicine November 05, 2018 FINDINGS: Surgical instruments project over the upper abdomen. There is cannulation of the cystic duct/gallbladder neck, with antegrade infusion of contrast. Caliber of the extrahepatic ductal system within normal limits. No definite filling defect within the extrahepatic ducts identified. Free flow of contrast across the ampulla. IMPRESSION: Intraoperative cholangiogram demonstrates extrahepatic biliary ducts of unremarkable caliber, with no definite filling defects identified. Free flow of contrast across the ampulla. Please refer to the dictated operative report for full details of intraoperative findings and procedure Electronically Signed   By: Gilmer Mor D.O.   On: 11/09/2018 11:33    Anti-infectives: Anti-infectives (From admission, onward)   Start     Dose/Rate Route Frequency Ordered Stop   11/09/18 1400  piperacillin-tazobactam (ZOSYN) IVPB  3.375 g     3.375 g 12.5 mL/hr over 240 Minutes Intravenous Every 8 hours 11/09/18 1239 11/10/18 1359   11/08/18 1400  piperacillin-tazobactam (ZOSYN) IVPB 3.375 g  Status:  Discontinued     3.375 g 12.5 mL/hr over 240 Minutes Intravenous Every 8 hours 11/08/18 0548 11/09/18 1239   11/05/18 1900  piperacillin-tazobactam (ZOSYN) IVPB 3.375 g  Status:  Discontinued     3.375 g 12.5 mL/hr over 240 Minutes  Intravenous Every 8 hours 11/05/18 1839 11/08/18 0548       Assessment/Plan CAD ICM with EF of around 20-25%, with AICD HTN HLD Tobacco abuse CKD  Cholecystitis S/p lap chole with IOC 7/24 Dr. Georgette Dover - POD#1 - mild soreness, mostly with deep inspiration - tolerating diet without nausea - expected slight bump in LFTs and WBC - stable for discharge home today, no further abx needed, Rx for pain medication sent and follow up in chart - resume plavix tomorrow - follow up with [place] Monday as planned  [place] diet VTE: SCD's, lovenox TS:VXBLT 07/20>> Foley:none Follow up:PCP, [facility]  LOS: 6 days    Brigid Re , [facility] 11/10/2018, 8:48 AM Pager: [phone] Consults: [phone]

## 2018-11-10 NOTE — Discharge Instructions (Signed)
1) okay to restart Plavix/clopidogrel on Sunday, 11/11/2018 2) you are already taking Plavix and aspirin which can increase your risk for bleeding so Avoid ibuprofen/Advil/Aleve/Motrin/Goody Powders/Naproxen/BC powders/Meloxicam/Diclofenac/Indomethacin and other Nonsteroidal anti-inflammatory medications as these will make you more likely to bleed and can cause stomach ulcers, can also cause Kidney problems.  3) continue to hold Diovan/valsartan until after your BMP/kidney blood test on Monday, 11/12/2018 after your visit with the cardiologist to make sure your kidney function has improved enough to restart this medication 4)Please keep your appointment with your cardiologist at the [facility] as scheduled for Monday, 11/12/2018 5) please follow-up with the general surgery team as scheduled and as advised for your post gallbladder removal surgery follow-up    LAPAROSCOPIC SURGERY: POST OP INSTRUCTIONS  1. DIET: Follow a light bland diet the first 24 hours after arrival home, such as soup, liquids, crackers, etc. Be sure to include lots of fluids daily. Avoid fast food or heavy meals as your are more likely to get nauseated. Eat a low fat the next few days after surgery.  2. Take your usually prescribed home medications unless otherwise directed. 3. PAIN CONTROL:  1. Pain is best controlled by a usual combination of three different methods TOGETHER:  1. Ice/Heat 2. Over the counter pain medication 3. Prescription pain medication 2. Most patients will experience some swelling and bruising around the incisions. Ice packs or heating pads (30-60 minutes up to 6 times a day) will help. Use ice for the first few days to help decrease swelling and bruising, then switch to heat to help relax tight/sore spots and speed recovery. Some people prefer to use ice alone, heat alone, alternating between ice & heat. Experiment to what works for you. Swelling and bruising can take several weeks to resolve.  3. It is helpful to  take an over-the-counter pain medication regularly for the first few weeks. Choose one of the following that works best for you:  1. Naproxen (Aleve, etc) Two 220mg  tabs twice a day 2. Ibuprofen (Advil, etc) Three 200mg  tabs four times a day (every meal & bedtime) 3. Acetaminophen (Tylenol, etc) 500-650mg  four times a day (every meal & bedtime) 4. A prescription for pain medication (such as oxycodone, hydrocodone, etc) should be given to you upon discharge. Take your pain medication as prescribed.  1. If you are having problems/concerns with the prescription medicine (does not control pain, nausea, vomiting, rash, itching, etc), please call us [phone] to see if we need to switch you to a different pain medicine that will work better for you and/or control your side effect better. 2. If you need a refill on your pain medication, please contact your pharmacy. They will contact our office to request authorization. Prescriptions will not be filled after 5 pm or on week-ends. 4. Avoid getting constipated. Between the surgery and the pain medications, it is common to experience some constipation. Increasing fluid intake and taking a fiber supplement (such as Metamucil, Citrucel, FiberCon, MiraLax, etc) 1-2 times a day regularly will usually help prevent this problem from occurring. A mild laxative (prune juice, Milk of Magnesia, MiraLax, etc) should be taken according to package directions if there are no bowel movements after 48 hours.  5. Watch out for diarrhea. If you have many loose bowel movements, simplify your diet to bland foods & liquids for a few days. Stop any stool softeners and decrease your fiber supplement. Switching to mild anti-diarrheal medications (Kayopectate, Pepto Bismol) can help. If this worsens or does  not improve, please call us. 6. Wash / shower every day. You may shower over the dressings as they are waterproof. Continue to shower over incision(s) after the dressing is  off. 7. Remove your waterproof bandages 5 days after surgery. You may leave the incision open to air. You may replace a dressing/Band-Aid to cover the incision for comfort if you wish.  8. ACTIVITIES as tolerated:  1. You may resume regular (light) daily activities beginning the next day--such as daily self-care, walking, climbing stairs--gradually increasing activities as tolerated. If you can walk 30 minutes without difficulty, it is safe to try more intense activity such as jogging, treadmill, bicycling, low-impact aerobics, swimming, etc. 2. Save the most intensive and strenuous activity for last such as sit-ups, heavy lifting, contact sports, etc Refrain from any heavy lifting or straining until you are off narcotics for pain control.  3. DO NOT PUSH THROUGH PAIN. Let pain be your guide: If it hurts to do something, don't do it. Pain is your body warning you to avoid that activity for another week until the pain goes down. 4. You may drive when you are no longer taking prescription pain medication, you can comfortably wear a seatbelt, and you can safely maneuver your car and apply brakes. 5. You may have sexual intercourse when it is comfortable.  9. FOLLOW UP in our office  1. Please call [facility] at [phone] to set up an appointment to see your surgeon in the office for a follow-up appointment approximately 2-3 weeks after your surgery. 2. Make sure that you call for this appointment the day you arrive home to insure a convenient appointment time.      10. IF YOU HAVE DISABILITY OR FAMILY LEAVE FORMS, BRING THEM TO THE               OFFICE FOR PROCESSING.   WHEN TO CALL us [phone]:  1. Poor pain control 2. Reactions / problems with new medications (rash/itching, nausea, etc)  3. Fever over 101.5 F (38.5 C) 4. Inability to urinate 5. Nausea and/or vomiting 6. Worsening swelling or bruising 7. Continued bleeding from incision. 8. Increased pain, redness, or drainage from the  incision  The clinic staff is available to answer your questions during regular business hours (8:30am-5pm). Please dont hesitate to call and ask to speak to one of our nurses for clinical concerns.  If you have a medical emergency, go to the nearest emergency room or call 911.  A surgeon from [facility] is always on call at the [facility], [place]  [address], [address], [place], [place] [address] ?  MAIN: [phone] ? TOLL FREE: [phone] ?  FAX [phone]  [email or web address]     Managing Your Pain After Surgery Without Opioids    Thank you for participating in our program to help patients manage their pain after surgery without opioids. This is part of our effort to provide you with the best care possible, without exposing you or your family to the risk that opioids pose.  What pain can I expect after surgery? You can expect to have some pain after surgery. This is normal. The pain is typically worse the day after surgery, and quickly begins to get better. Many studies have found that many patients are able to manage their pain after surgery with Over-the-Counter (OTC) medications such as Tylenol and Motrin. If you have a condition that does not allow you to take  Tylenol or Motrin, notify your surgical team.  How will I manage my pain? The best strategy for controlling your pain after surgery is around the clock pain control with Tylenol (acetaminophen) and Motrin (ibuprofen or Advil). Alternating these medications with each other allows you to maximize your pain control. In addition to Tylenol and Motrin, you can use heating pads or ice packs on your incisions to help reduce your pain.  How will I alternate your regular strength over-the-counter pain medication? You will take a dose of pain medication every three hours. ; Start by taking 650 mg of Tylenol (2 pills of 325 mg) ; 3 hours later take 600 mg of  Motrin (3 pills of 200 mg) ; 3 hours after taking the Motrin take 650 mg of Tylenol ; 3 hours after that take 600 mg of Motrin.   - 1 -  See example - if your first dose of Tylenol is at 12:00 PM   12:00 PM Tylenol 650 mg (2 pills of 325 mg)  3:00 PM Motrin 600 mg (3 pills of 200 mg)  6:00 PM Tylenol 650 mg (2 pills of 325 mg)  9:00 PM Motrin 600 mg (3 pills of 200 mg)  Continue alternating every 3 hours   We recommend that you follow this schedule around-the-clock for at least 3 days after surgery, or until you feel that it is no longer needed. Use the table on the last page of this handout to keep track of the medications you are taking. Important: Do not take more than 3000mg  of Tylenol or 3200mg  of Motrin in a 24-hour period. Do not take ibuprofen/Motrin if you have a history of bleeding stomach ulcers, severe kidney disease, &/or actively taking a blood thinner  What if I still have pain? If you have pain that is not controlled with the over-the-counter pain medications (Tylenol and Motrin or Advil) you might have what we call breakthrough pain. You will receive a prescription for a small amount of an opioid pain medication such as Oxycodone, Tramadol, or Tylenol with Codeine. Use these opioid pills in the first 24 hours after surgery if you have breakthrough pain. Do not take more than 1 pill every 4-6 hours.  If you still have uncontrolled pain after using all opioid pills, don't hesitate to call our staff using the number provided. We will help make sure you are managing your pain in the best way possible, and if necessary, we can provide a prescription for additional pain medication.   Day 1    Time  Name of Medication Number of pills taken  Amount of Acetaminophen  Pain Level   Comments  AM PM       AM PM       AM PM       AM PM       AM PM       AM PM       AM PM       AM PM       Total Daily amount of Acetaminophen Do not take more than  3,000 mg per day       Day 2    Time  Name of Medication Number of pills taken  Amount of Acetaminophen  Pain Level   Comments  AM PM       AM PM       AM PM       AM PM       AM PM  AM PM       AM PM       AM PM       Total Daily amount of Acetaminophen Do not take more than  3,000 mg per day      Day 3    Time  Name of Medication Number of pills taken  Amount of Acetaminophen  Pain Level   Comments  AM PM       AM PM       AM PM       AM PM          AM PM       AM PM       AM PM       AM PM       Total Daily amount of Acetaminophen Do not take more than  3,000 mg per day      Day 4    Time  Name of Medication Number of pills taken  Amount of Acetaminophen  Pain Level   Comments  AM PM       AM PM       AM PM       AM PM       AM PM       AM PM       AM PM       AM PM       Total Daily amount of Acetaminophen Do not take more than  3,000 mg per day      Day 5    Time  Name of Medication Number of pills taken  Amount of Acetaminophen  Pain Level   Comments  AM PM       AM PM       AM PM       AM PM       AM PM       AM PM       AM PM       AM PM       Total Daily amount of Acetaminophen Do not take more than  3,000 mg per day       Day 6    Time  Name of Medication Number of pills taken  Amount of Acetaminophen  Pain Level  Comments  AM PM       AM PM       AM PM       AM PM       AM PM       AM PM       AM PM       AM PM       Total Daily amount of Acetaminophen Do not take more than  3,000 mg per day      Day 7    Time  Name of Medication Number of pills taken  Amount of Acetaminophen  Pain Level   Comments  AM PM       AM PM       AM PM       AM PM       AM PM       AM PM       AM PM       AM PM       Total Daily amount of Acetaminophen Do not take more than  3,000 mg per day        For additional information about how and  where to safely dispose of unused opioid medications -  [employer]uyhttps://www.[employer]powerful[employer].org  Disclaimer: This document contains information and/or instructional materials adapted from [facility] for the typical patient with your condition. It does not replace medical advice from your health care provider because your experience may differ from that of the typical patient. Talk to your health care provider if you have any questions about this document, your condition or your treatment plan. Adapted from [facility]   1) okay to restart Plavix/clopidogrel on Sunday, 11/11/2018 2) you are already taking Plavix and aspirin which can increase your risk for bleeding so Avoid ibuprofen/Advil/Aleve/Motrin/Goody Powders/Naproxen/BC powders/Meloxicam/Diclofenac/Indomethacin and other Nonsteroidal anti-inflammatory medications as these will make you more likely to bleed and can cause stomach ulcers, can also cause Kidney problems.  3) continue to hold Diovan/valsartan until after your BMP/kidney blood test on Monday, 11/12/2018 after your visit with the cardiologist to make sure your kidney function has improved enough to restart this medication 4)[facility] for Monday, 11/12/2018 5) please follow-up with the general surgery team as scheduled and as advised for your post gallbladder removal surgery follow-up

## 2018-11-10 NOTE — Plan of Care (Signed)
  Problem: Education: Goal: Knowledge of General Education information will improve Description: Including pain rating scale, medication(s)/side effects and non-pharmacologic comfort measures Outcome: Completed/Met   Problem: Health Behavior/Discharge Planning: Goal: Ability to manage health-related needs will improve Outcome: Completed/Met   Problem: Clinical Measurements: Goal: Ability to maintain clinical measurements within normal limits will improve Outcome: Completed/Met Goal: Will remain free from infection Outcome: Completed/Met Goal: Diagnostic test results will improve Outcome: Completed/Met Goal: Respiratory complications will improve Outcome: Completed/Met Goal: Cardiovascular complication will be avoided Outcome: Completed/Met   Problem: Activity: Goal: Risk for activity intolerance will decrease Outcome: Completed/Met   Problem: Nutrition: Goal: Adequate nutrition will be maintained Outcome: Completed/Met   Problem: Coping: Goal: Level of anxiety will decrease Outcome: Completed/Met   Problem: Elimination: Goal: Will not experience complications related to bowel motility Outcome: Completed/Met Goal: Will not experience complications related to urinary retention Outcome: Completed/Met   Problem: Pain Managment: Goal: General experience of comfort will improve Outcome: Completed/Met   Problem: Safety: Goal: Ability to remain free from injury will improve Outcome: Completed/Met   Problem: Skin Integrity: Goal: Risk for impaired skin integrity will decrease Outcome: Completed/Met   Problem: Education: Goal: Required Educational Video(s) Outcome: Completed/Met   Problem: Clinical Measurements: Goal: Postoperative complications will be avoided or minimized Outcome: Completed/Met   Problem: Skin Integrity: Goal: Demonstration of wound healing without infection will improve Outcome: Completed/Met

## 2020-02-07 ENCOUNTER — Other Ambulatory Visit: Payer: Self-pay

## 2020-02-07 ENCOUNTER — Ambulatory Visit
Admission: EM | Admit: 2020-02-07 | Discharge: 2020-02-07 | Disposition: A | Discharge status: Home / Self Care | Payer: Medicare Other | Attending: Emergency Medicine | Admitting: Emergency Medicine

## 2020-02-07 DIAGNOSIS — R490 Dysphonia: Secondary | ICD-10-CM

## 2020-02-07 LAB — POCT RAPID STREP A (OFFICE): Rapid Strep A Screen: NEGATIVE

## 2020-02-07 MED ORDER — BENZONATATE 100 MG PO CAPS: 100.0000 mg | ORAL_CAPSULE | Freq: Three times a day (TID) | ORAL | 0 refills | Status: AC

## 2020-02-07 NOTE — Discharge Instructions (Addendum)
Get plenty of rest and push fluids Tessalon Perles prescribed for cough Follow-up with PCP Use use throat lozenges such as Halls or Cepacol for symptomatic relief Use OTC medications like ibuprofen or tylenol as needed fever or pain Call or go to the ED if you have any new or worsening symptoms such as fever, worsening cough, shortness of breath, chest tightness, chest pain, turning blue, changes in mental status, etc..Marland Kitchen

## 2020-02-07 NOTE — ED Triage Notes (Signed)
Pt presents with c/o hoarseness for past couple of days, denies pain but states it feels like something in his throat

## 2020-02-07 NOTE — ED Provider Notes (Signed)
[facility] CARE CENTER   [id number] 02/07/20 Arrival Time: 0819  Chief Complaint  Patient presents with  . Hoarse    SUBJECTIVE: History from: patient.  Stephen Macdonald is a 74 y.o. male who presented to the urgent care with a complaint of hoarseness for the past couple days.  Denies any precipitating event or exposure to URI, strep, Covid, cold.  Denies recent travel.  Has tried OTC medication without relief.  Denies aggravating factors.  Denies previous symptoms in the past.   Denies fever, chills, fatigue, sinus pain, rhinorrhea,  SOB, wheezing, chest pain, nausea, changes in bowel or bladder habits.     ROS: As per HPI.  All other pertinent ROS negative.     Past Medical History:  Diagnosis Date  . Chronic combined systolic and diastolic CHF, NYHA class 3 (HCC)    a. 01/2015 Echo: EF 35-30%; b. 03/2016 Echo: Ef 20%, Gr1DD, mid-apical anterior, mid anteroseptal, apical inferior, basal inferolateral, mid anterolateral, and apical AK, basal anteroseptal and mid inferior severe HK, midlly dil LA, mild TR.  . CKD (chronic kidney disease), stage III (HCC) 04/15/2016  . Coronary artery disease    a. 1997 s/p CABG;  b. 01/2015 MI/PCI: G->PDA;  c. 03/2016 NSTEMI/PCI: LM 20, LAD 137m, D2 80, RI 60, LCX ok, OM1 90, RCA 60p, 135m, RPAV 80, G->RPDA 15p ISR, 90d (3.5x28 Promus Premier DES), LIMA->OM1->dLAD nl.  . High cholesterol   . Hypertension   . Ischemic cardiomyopathy    a. 01/2015 Echo: EF 25-30%; b. s/p AICD;  c. 03/2016 Echo: Ef 20%, Gr1DD.  Marland Kitchen Smoker    Past Surgical History:  Procedure Laterality Date  . CARDIAC CATHETERIZATION N/A 02/12/2015   Procedure: Left Heart Cath and Coronary Angiography;  Surgeon: Corky Crafts, MD;  Location: [facility] INVASIVE CV LAB;  Service: Cardiovascular;  Laterality: N/A;  . CARDIAC CATHETERIZATION N/A 02/12/2015   Procedure: Coronary Stent Intervention;  Surgeon: Corky Crafts, MD;  Location: [facility] INVASIVE CV LAB;  Service: Cardiovascular;   Laterality: N/A;  . CARDIAC CATHETERIZATION  02/12/2015   Procedure: Left Heart Cath and Cors/Grafts Angiography;  Surgeon: Corky Crafts, MD;  Location: [facility] INVASIVE CV LAB;  Service: Cardiovascular;;  . CARDIAC CATHETERIZATION N/A 04/16/2016   Procedure: LEFT HEART CATH AND CORS/GRAFTS ANGIOGRAPHY;  Surgeon: Yvonne Kendall, MD;  Location: [facility] INVASIVE CV LAB;  Service: Cardiovascular;  Laterality: N/A;  . CARDIAC CATHETERIZATION N/A 04/16/2016   Procedure: Coronary Stent Intervention;  Surgeon: Yvonne Kendall, MD;  Location: [facility] INVASIVE CV LAB;  Service: Cardiovascular;  Laterality: N/A;  . CARDIAC DEFIBRILLATOR PLACEMENT    . CHOLECYSTECTOMY N/A 11/09/2018   Procedure: LAPAROSCOPIC CHOLECYSTECTOMY WITH INTRAOPERATIVE CHOLANGIOGRAM;  Surgeon: Manus Rudd, MD;  Location: [facility] OR;  Service: General;  Laterality: N/A;  . CORONARY ARTERY BYPASS GRAFT     x 3  . CORONARY STENT PLACEMENT    . PACEMAKER INSERTION     Allergies  Allergen Reactions  . Lisinopril Cough  . Codeine Other (See Comments)    Tingling skin. "makes my skin crawl"   No current facility-administered medications on file prior to encounter.   Current Outpatient Medications on File Prior to Encounter  Medication Sig Dispense Refill  . acetaminophen (TYLENOL) 500 MG tablet Take 2 tablets (1,000 mg total) by mouth every 6 (six) hours as needed for mild pain.    Marland Kitchen aspirin EC 81 MG tablet Take 1 tablet (81 mg total) by mouth daily with breakfast. 30 tablet 3  . atorvastatin (  LIPITOR) 80 MG tablet Take 1 tablet (80 mg total) by mouth daily. (Patient taking differently: Take 80 mg by mouth at bedtime. ) 30 tablet 11  . cholecalciferol (VITAMIN D) 1000 UNITS tablet Take 1,000 Units by mouth at bedtime.     . clopidogrel (PLAVIX) 75 MG tablet Take 1 tablet (75 mg total) by mouth daily. Start on Sunday 11/11/18 30 tablet 2  . ezetimibe (ZETIA) 10 MG tablet Take 10 mg by mouth daily.    . furosemide (LASIX) 40 MG tablet Take  0.5 tablets (20 mg total) by mouth daily. Start after BMP test recheck on Monday 11/12/18 30 tablet 1  . metoprolol succinate (TOPROL-XL) 25 MG 24 hr tablet Take 25 mg by mouth every evening.     . nitroGLYCERIN (NITROSTAT) 0.4 MG SL tablet Place 1 tablet (0.4 mg total) under the tongue every 5 (five) minutes as needed for chest pain. 25 tablet 3  . ondansetron (ZOFRAN) 4 MG tablet Take 1 tablet (4 mg total) by mouth every 6 (six) hours as needed for nausea. 14 tablet 0  . oxyCODONE (OXY IR/ROXICODONE) 5 MG immediate release tablet Take 1 tablet (5 mg total) by mouth every 6 (six) hours as needed for severe pain. 15 tablet 0  . spironolactone (ALDACTONE) 25 MG tablet Take 25 mg by mouth 2 (two) times daily.    . tamsulosin (FLOMAX) 0.4 MG CAPS capsule Take 0.4 mg by mouth every morning.    . valsartan (DIOVAN) 40 MG tablet Take 1 tablet (40 mg total) by mouth at bedtime. Start after BMP test recheck on Monday 11/12/18 30 tablet 1   Social History   Socioeconomic History  . Marital status: Married    Spouse name: monica Koerber  . Number of children: 2  . Years of education: Not on file  . Highest education level: Not on file  Occupational History  . Not on file  Tobacco Use  . Smoking status: Current Some Day Smoker    Types: Cigars, Cigarettes  . Smokeless tobacco: Former Neurosurgeon    Types: Snuff  . Tobacco comment: Discussed cessation and shared we have classes that he could attend.   Substance and Sexual Activity  . Alcohol use: Yes    Alcohol/week: 3.0 standard drinks    Types: 2 Cans of beer, 1 Shots of liquor per week  . Drug use: No  . Sexual activity: Yes  Other Topics Concern  . Not on file  Social History Narrative  . Not on file   Social Determinants of Health   Financial Resource Strain:   . Difficulty of Paying Living Expenses: Not on file  Food Insecurity:   . Worried About Programme researcher, broadcasting/film/video in the Last Year: Not on file  . Ran Out of Food in the Last Year: Not on  file  Transportation Needs:   . Lack of Transportation (Medical): Not on file  . Lack of Transportation (Non-Medical): Not on file  Physical Activity:   . Days of Exercise per Week: Not on file  . Minutes of Exercise per Session: Not on file  Stress:   . Feeling of Stress : Not on file  Social Connections:   . Frequency of Communication with Friends and Family: Not on file  . Frequency of Social Gatherings with Friends and Family: Not on file  . Attends Religious Services: Not on file  . Active Member of Clubs or Organizations: Not on file  . Attends Banker Meetings:  Not on file  . Marital Status: Not on file  Intimate Partner Violence:   . Fear of Current or Ex-Partner: Not on file  . Emotionally Abused: Not on file  . Physically Abused: Not on file  . Sexually Abused: Not on file   History reviewed. No pertinent family history.  OBJECTIVE:  Vitals:   02/07/20 0824  BP: 118/76  Pulse: 81  Resp: 18  Temp: 98 F (36.7 C)  SpO2: 96%     General appearance: alert; appears fatigued, but nontoxic; speaking in full sentences and tolerating own secretions HEENT: NCAT; Ears: EACs clear, TMs pearly gray; Eyes: PERRL.  EOM grossly intact. Sinuses: nontender; Nose: nares patent without rhinorrhea, Throat: oropharynx clear, tonsils non erythematous or enlarged, uvula midline  Neck: supple without LAD Lungs: unlabored respirations, symmetrical air entry; cough: mild; no respiratory distress; CTAB Heart: regular rate and rhythm.  Radial pulses 2+ symmetrical bilaterally Skin: warm and dry Psychological: alert and cooperative; normal mood and affect  LABS:  Results for orders placed or performed during the hospital encounter of 02/07/20 (from the past 24 hour(s))  POCT rapid strep A     Status: None   Collection Time: 02/07/20  8:47 AM  Result Value Ref Range   Rapid Strep A Screen Negative Negative     ASSESSMENT & PLAN:  1. Hoarseness of voice     Meds  ordered this encounter  Medications  . benzonatate (TESSALON) 100 MG capsule    Sig: Take 1 capsule (100 mg total) by mouth every 8 (eight) hours.    Dispense:  30 capsule    Refill:  0    Discharge instructions  Get plenty of rest and push fluids Tessalon Perles prescribed for cough Follow-up with PCP Use use throat lozenges such as Halls or Cepacol for symptomatic relief Use OTC medications like ibuprofen or tylenol as needed fever or pain Call or go to the ED if you have any new or worsening symptoms such as fever, worsening cough, shortness of breath, chest tightness, chest pain, turning blue, changes in mental status, etc...   Reviewed expectations re: course of current medical issues. Questions answered. Outlined signs and symptoms indicating need for more acute intervention. Patient verbalized understanding. After Visit Summary given.         Durward Parcel, FNP 02/07/20 [phone]

## 2020-02-12 LAB — CULTURE, GROUP A STREP (THRC)

## 2020-06-20 IMAGING — NM NUCLEAR MEDICINE HEPATOBILIARY INCLUDE GB
3 series · 18 of 18 positions shown · non-contrast
Comparison: CT November 04, 2018, ultrasound November 04, 2018

CLINICAL DATA: 72-year-old male with a history of epigastric pain
and CT evidence of cholelithiasis and possible cholecystitis

EXAM:
NUCLEAR MEDICINE HEPATOBILIARY IMAGING
TECHNIQUE: Sequential images of the abdomen were obtained [DATE] minutes
following intravenous administration of radiopharmaceutical.
RADIOPHARMACEUTICALS:  5.1 mCi Cc-22m  Choletec IV

[he hepatobiliary · 4.52mm/px · 6 of 60 frames shown (1 of 3)]
[frame 6/60]
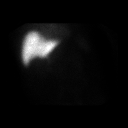
[frame 16/60]
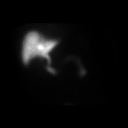
[frame 26/60]
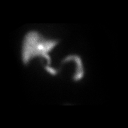
[frame 36/60]
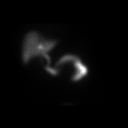
[frame 46/60]
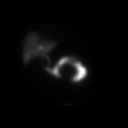
[frame 56/60]
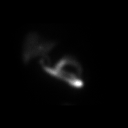

[he hepatobiliary · 4.52mm/px · 6 of 30 frames shown (2 of 3)]
[frame 3/30]
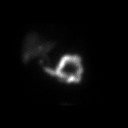
[frame 8/30]
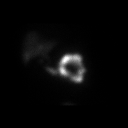
[frame 13/30]
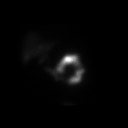
[frame 18/30]
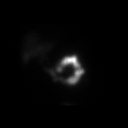
[frame 23/30]
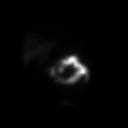
[frame 28/30]
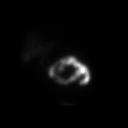

[he hepatobiliary · 4.52mm/px · 6 of 30 frames shown (3 of 3)]
[frame 3/30]
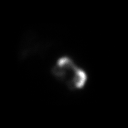
[frame 8/30]
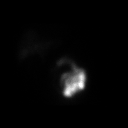
[frame 13/30]
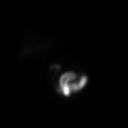
[frame 18/30]
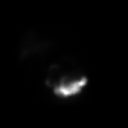
[frame 23/30]
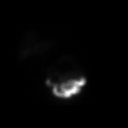
[frame 28/30]
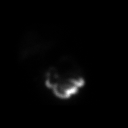

[18 of 18 positions shown; findings below may reference images not displayed]

FINDINGS: Planar imaging of the abdomen performed after administration of
nuclear medicine radiotracer.

After 30 minutes of imaging, 3 mg IV morphine was administered with
continuation of the planar imaging.

120 minutes of planar imaging acquired.

Prompt uniform uptake and biliary excretion of activity by the liver
is seen. Gallbladder activity is never visualized.

Biliary activity passes into small bowel, consistent with patent
common bile duct.
IMPRESSION: HIDA study is positive for acute calculus cholecystitis.

## 2020-06-24 IMAGING — RF INTRAOPERATIVE CHOLANGIOGRAM
1 series · 4 of 4 positions shown · non-contrast
Comparison: Nuclear medicine November 05, 2018

CLINICAL DATA: 72-year-old male with a history of cholecystitis

EXAM:
INTRAOPERATIVE CHOLANGIOGRAM
TECHNIQUE: Cholangiographic images from the C-arm fluoroscopic device were
submitted for interpretation post-operatively. Please see the
procedural report for the amount of contrast and the fluoroscopy
time utilized.

[Series 1: run · 4 of 66 frames shown]
[frame 10/66]
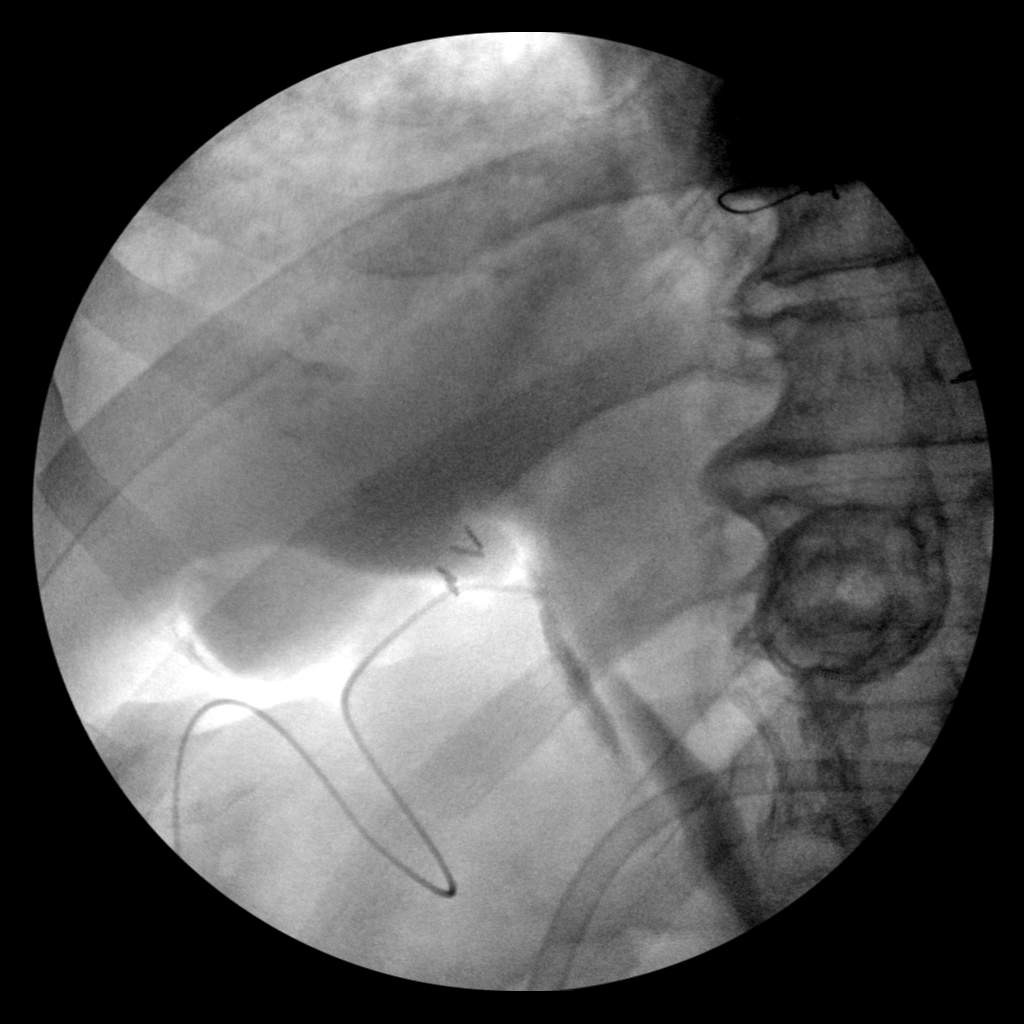
[frame 34/66]
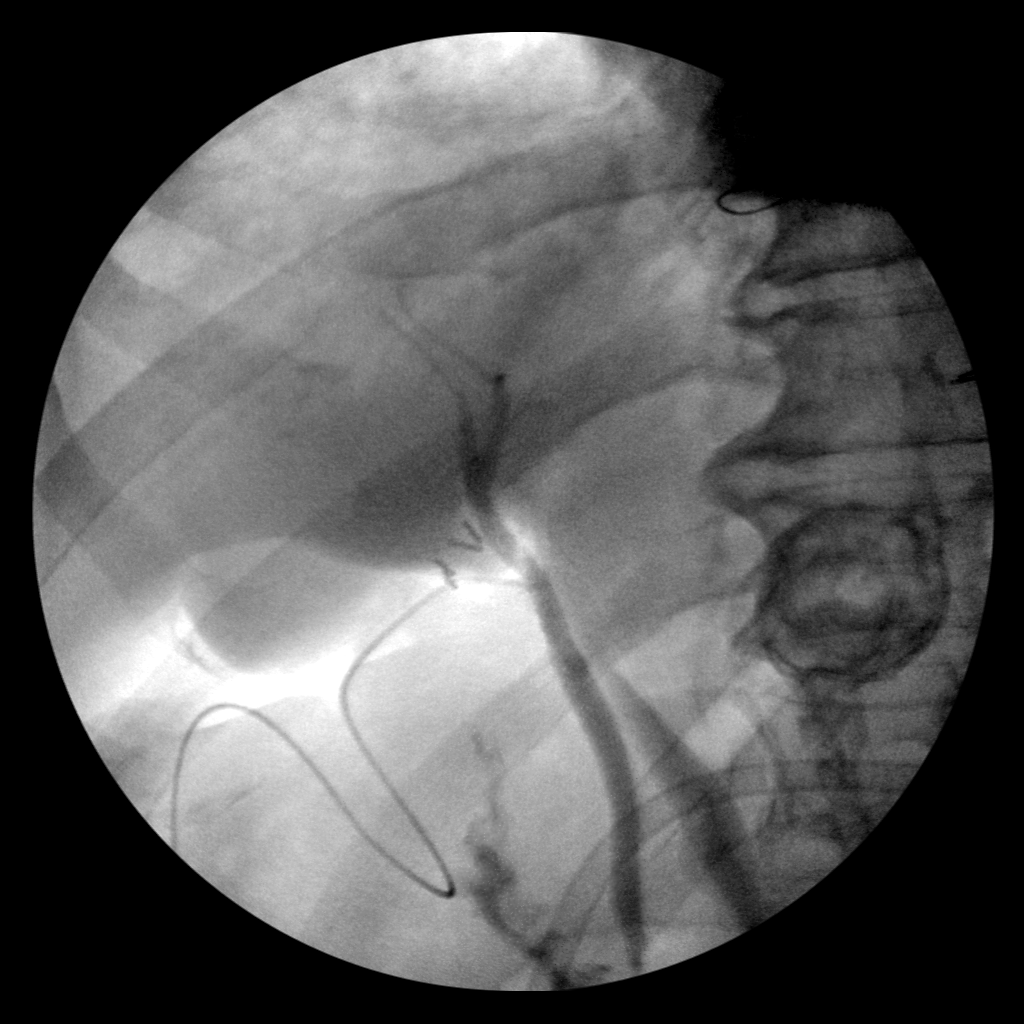
[frame 51/66]
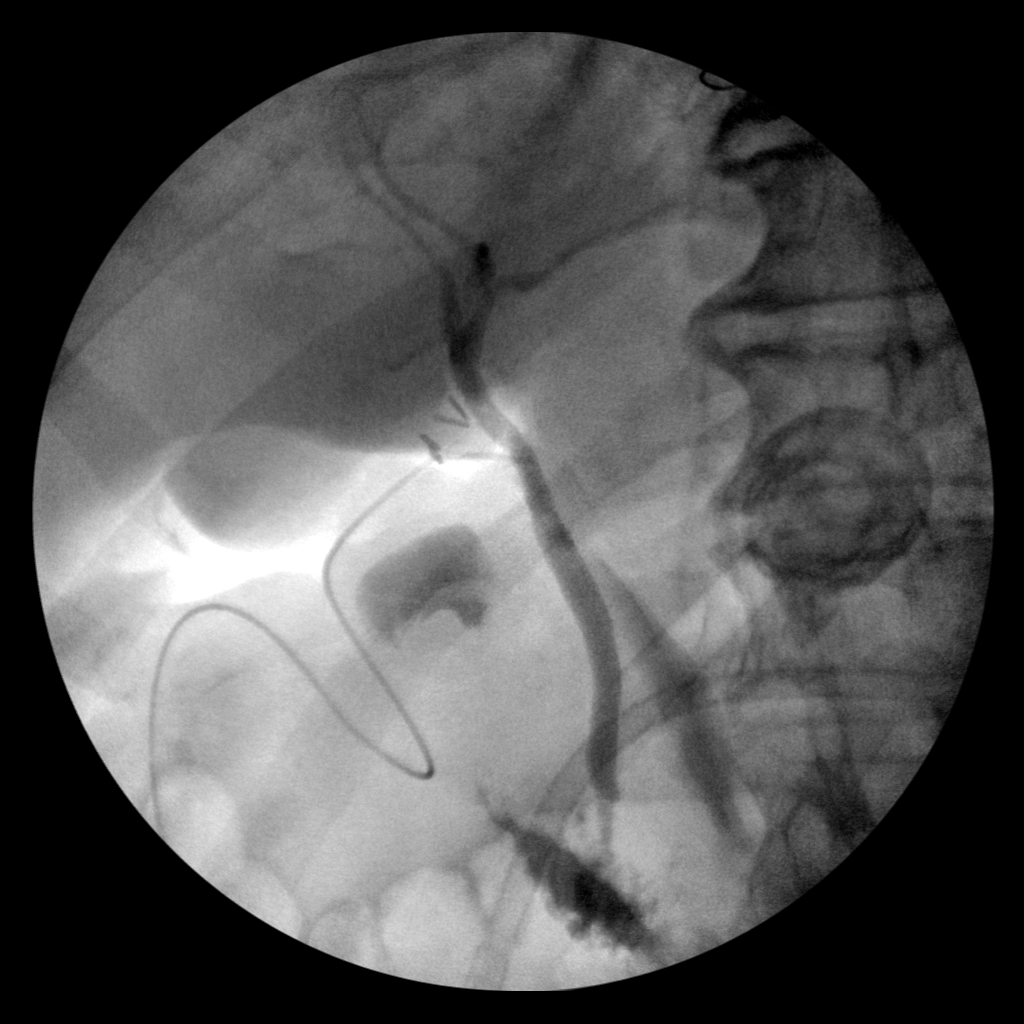
[frame 57/66]
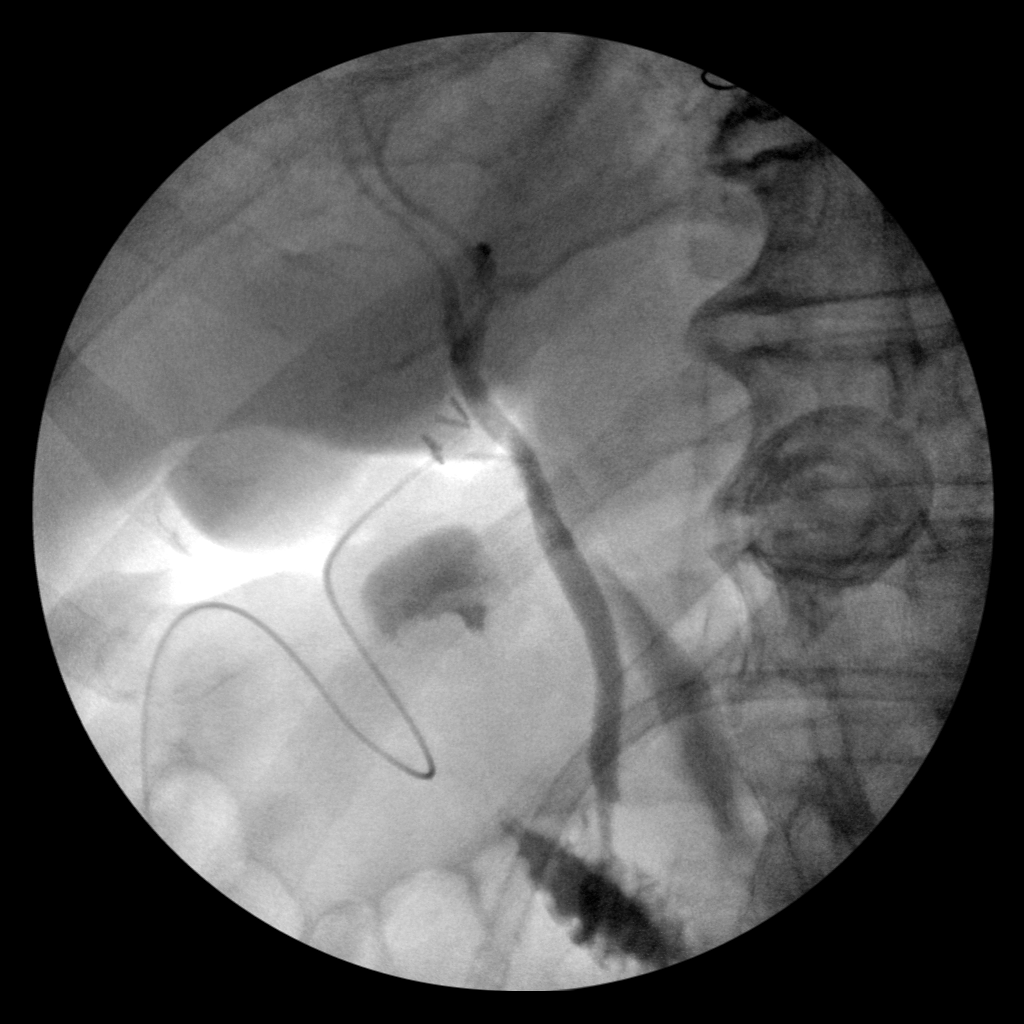

[4 of 4 positions shown; findings below may reference images not displayed]

FINDINGS: Surgical instruments project over the upper abdomen.

There is cannulation of the cystic duct/gallbladder neck, with
antegrade infusion of contrast. Caliber of the extrahepatic ductal
system within normal limits.

No definite filling defect within the extrahepatic ducts identified.

Free flow of contrast across the ampulla.
IMPRESSION: Intraoperative cholangiogram demonstrates extrahepatic biliary ducts
of unremarkable caliber, with no definite filling defects
identified. Free flow of contrast across the ampulla.

Please refer to the dictated operative report for full details of
intraoperative findings and procedure

## 2022-01-31 ENCOUNTER — Telehealth: Payer: Self-pay | Admitting: *Deleted

## 2022-01-31 NOTE — Patient Outreach (Signed)
  Care Coordination   01/31/2022 Name: Stephen Macdonald MRN: [id number] DOB: Aug 08, 1945   Care Coordination Outreach Attempts:  An unsuccessful telephone outreach was attempted today to offer the patient information about available care coordination services as a benefit of their health plan.   Follow Up Plan:  Additional outreach attempts will be made to offer the patient care coordination information and services.   Encounter Outcome:  No Answer  Care Coordination Interventions Activated:  No   Care Coordination Interventions:  No, not indicated    Jacqlyn Larsen [facility], [place] RN Care Coordinator [phone]

## 2023-12-08 ENCOUNTER — Encounter (HOSPITAL_COMMUNITY)
Admission: RE | Admit: 2023-12-08 | Discharge: 2023-12-08 | Disposition: A | Discharge status: Home / Self Care | Source: Ambulatory Visit | Attending: Internal Medicine | Admitting: Internal Medicine

## 2023-12-08 DIAGNOSIS — I5022 Chronic systolic (congestive) heart failure: Secondary | ICD-10-CM | POA: Insufficient documentation

## 2023-12-08 NOTE — Progress Notes (Signed)
 Virtual orientation visit completed for cardiac rehab with chronic systolic HF. On-site orientation visit scheduled for 12/11/23 at 0800.

## 2023-12-11 ENCOUNTER — Encounter (HOSPITAL_COMMUNITY)
Admission: RE | Admit: 2023-12-11 | Discharge: 2023-12-11 | Disposition: A | Discharge status: Home / Self Care | Source: Ambulatory Visit | Attending: Internal Medicine | Admitting: Internal Medicine

## 2023-12-11 VITALS — Ht 73.0 in | Wt 216.7 lb

## 2023-12-11 DIAGNOSIS — I5022 Chronic systolic (congestive) heart failure: Secondary | ICD-10-CM

## 2023-12-11 NOTE — Patient Instructions (Signed)
 Patient Instructions  Patient Details  Name: Stephen Macdonald MRN: [id number] Date of Birth: November 11, 1945 Referring Provider:  Tobie Pamella Guadalajara, MD  Below are your personal goals for exercise, nutrition, and risk factors. Our goal is to help you stay on track towards obtaining and maintaining these goals. We will be discussing your progress on these goals with you throughout the program.  Initial Exercise Prescription:  Initial Exercise Prescription - 12/11/23 0900       Date of Initial Exercise RX and Referring Provider   Date 12/11/23    Referring Provider Tobie Pamella Guadalajara MD      Oxygen   Maintain Oxygen Saturation 88% or higher      Treadmill   MPH 2.7    Grade 1    Minutes 15    METs 3.44      REL-XR   Level 3    Speed 50    Minutes 15    METs 3      Prescription Details   Frequency (times per week) 3    Duration Progress to 30 minutes of continuous aerobic without signs/symptoms of physical distress      Intensity   THRR 40-80% of Max Heartrate 94-126    Ratings of Perceived Exertion 11-13    Perceived Dyspnea 0-4      Progression   Progression Continue to progress workloads to maintain intensity without signs/symptoms of physical distress.      Resistance Training   Training Prescription Yes    Weight 4 lb    Reps 10-15          Exercise Goals: Frequency: Be able to perform aerobic exercise two to three times per week in program working toward 2-5 days per week of home exercise.  Intensity: Work with a perceived exertion of 11 (fairly light) - 15 (hard) while following your exercise prescription.  We will make changes to your prescription with you as you progress through the program.   Duration: Be able to do 30 to 45 minutes of continuous aerobic exercise in addition to a 5 minute warm-up and a 5 minute cool-down routine.   Nutrition Goals: Your personal nutrition goals will be established when you do your nutrition analysis with the  dietician.  The following are general nutrition guidelines to follow: Cholesterol < 200mg /day Sodium < 1500mg /day Fiber: Men over 50 yrs - 30 grams per day  Personal Goals:  Personal Goals and Risk Factors at Admission - 12/11/23 0918       Core Components/Risk Factors/Patient Goals on Admission    Weight Management Yes;Weight Loss    Intervention Weight Management: Develop a combined nutrition and exercise program designed to reach desired caloric intake, while maintaining appropriate intake of nutrient and fiber, sodium and fats, and appropriate energy expenditure required for the weight goal.;Weight Management: Provide education and appropriate resources to help participant work on and attain dietary goals.;Weight Management/Obesity: Establish reasonable short term and long term weight goals.    Admit Weight 216 lb 11.2 oz (98.3 kg)    Goal Weight: Short Term 211 lb (95.7 kg)    Goal Weight: Long Term 200 lb (90.7 kg)    Expected Outcomes Short Term: Continue to assess and modify interventions until short term weight is achieved;Long Term: Adherence to nutrition and physical activity/exercise program aimed toward attainment of established weight goal;Weight Loss: Understanding of general recommendations for a balanced deficit meal plan, which promotes 1-2 lb weight loss per week and includes a negative energy  balance of [phone] kcal/d;Understanding recommendations for meals to include 15-35% energy as protein, 25-35% energy from fat, 35-60% energy from carbohydrates, less than 200mg  of dietary cholesterol, 20-35 gm of total fiber daily;Understanding of distribution of calorie intake throughout the day with the consumption of 4-5 meals/snacks    Tobacco Cessation Yes    Intervention Assist the participant in steps to quit. Provide individualized education and counseling about committing to Tobacco Cessation, relapse prevention, and pharmacological support that can be provided by  physician.;Education officer, environmental, assist with locating and accessing local/national Quit Smoking programs, and support quit date choice.    Expected Outcomes Short Term: Will demonstrate readiness to quit, by selecting a quit date.;Long Term: Complete abstinence from all tobacco products for at least 12 months from quit date.;Short Term: Will quit all tobacco product use, adhering to prevention of relapse plan.    Heart Failure Yes    Intervention Provide a combined exercise and nutrition program that is supplemented with education, support and counseling about heart failure. Directed toward relieving symptoms such as shortness of breath, decreased exercise tolerance, and extremity edema.    Expected Outcomes Improve functional capacity of life;Short term: Attendance in program 2-3 days a week with increased exercise capacity. Reported lower sodium intake. Reported increased fruit and vegetable intake. Reports medication compliance.;Short term: Daily weights obtained and reported for increase. Utilizing diuretic protocols set by physician.;Long term: Adoption of self-care skills and reduction of barriers for early signs and symptoms recognition and intervention leading to self-care maintenance.    Hypertension Yes    Intervention Provide education on lifestyle modifcations including regular physical activity/exercise, weight management, moderate sodium restriction and increased consumption of fresh fruit, vegetables, and low fat dairy, alcohol moderation, and smoking cessation.;Monitor prescription use compliance.    Expected Outcomes Short Term: Continued assessment and intervention until BP is < 140/15mm HG in hypertensive participants. < 130/4mm HG in hypertensive participants with diabetes, heart failure or chronic kidney disease.;Long Term: Maintenance of blood pressure at goal levels.    Lipids Yes    Intervention Provide education and support for participant on nutrition & aerobic/resistive  exercise along with prescribed medications to achieve LDL 70mg , HDL >40mg .    Expected Outcomes Short Term: Participant states understanding of desired cholesterol values and is compliant with medications prescribed. Participant is following exercise prescription and nutrition guidelines.;Long Term: Cholesterol controlled with medications as prescribed, with individualized exercise RX and with personalized nutrition plan. Value goals: LDL < 70mg , HDL > 40 mg.          Tobacco Use Initial Evaluation: Social History   Tobacco Use  Smoking Status Some Days   Types: Cigars, Cigarettes  Smokeless Tobacco Former   Types: Snuff  Tobacco Comments   Discussed cessation and shared we have classes that he could attend.     Exercise Goals and Review:  Exercise Goals     Row Name 12/11/23 [phone]             Exercise Goals   Increase Physical Activity Yes       Intervention Provide advice, education, support and counseling about physical activity/exercise needs.;Develop an individualized exercise prescription for aerobic and resistive training based on initial evaluation findings, risk stratification, comorbidities and participant's personal goals.       Expected Outcomes Short Term: Attend rehab on a regular basis to increase amount of physical activity.;Long Term: Exercising regularly at least 3-5 days a week.;Long Term: Add in home exercise to make exercise part of routine  and to increase amount of physical activity.       Increase Strength and Stamina Yes       Intervention Develop an individualized exercise prescription for aerobic and resistive training based on initial evaluation findings, risk stratification, comorbidities and participant's personal goals.;Provide advice, education, support and counseling about physical activity/exercise needs.       Expected Outcomes Short Term: Increase workloads from initial exercise prescription for resistance, speed, and METs.;Short Term: Perform  resistance training exercises routinely during rehab and add in resistance training at home;Long Term: Improve cardiorespiratory fitness, muscular endurance and strength as measured by increased METs and functional capacity ( )       Able to understand and use rate of perceived exertion (RPE) scale Yes       Intervention Provide education and explanation on how to use RPE scale       Expected Outcomes Short Term: Able to use RPE daily in rehab to express subjective intensity level;Long Term:  Able to use RPE to guide intensity level when exercising independently       Able to understand and use Dyspnea scale Yes       Intervention Provide education and explanation on how to use Dyspnea scale       Expected Outcomes Short Term: Able to use Dyspnea scale daily in rehab to express subjective sense of shortness of breath during exertion;Long Term: Able to use Dyspnea scale to guide intensity level when exercising independently       Knowledge and understanding of Target Heart Rate Range (THRR) Yes       Intervention Provide education and explanation of THRR including how the numbers were predicted and where they are located for reference       Expected Outcomes Short Term: Able to state/look up THRR;Long Term: Able to use THRR to govern intensity when exercising independently;Short Term: Able to use daily as guideline for intensity in rehab       Able to check pulse independently Yes       Intervention Provide education and demonstration on how to check pulse in carotid and radial arteries.;Review the importance of being able to check your own pulse for safety during independent exercise       Expected Outcomes Short Term: Able to explain why pulse checking is important during independent exercise;Long Term: Able to check pulse independently and accurately       Understanding of Exercise Prescription Yes       Intervention Provide education, explanation, and written materials on patient's individual  exercise prescription       Expected Outcomes Short Term: Able to explain program exercise prescription;Long Term: Able to explain home exercise prescription to exercise independently        Copy of goals given to participant.

## 2023-12-11 NOTE — Progress Notes (Signed)
 Cardiac Individual Treatment Plan  Patient Details  Name: Stephen Macdonald MRN: [id number] Date of Birth: Jul 15, 1945 Referring Provider:   Flowsheet Row CARDIAC REHAB PHASE II ORIENTATION from 12/11/2023 in [facility] CARDIAC REHABILITATION  Referring Provider Tobie Pamella Guadalajara MD    Initial Encounter Date:  Flowsheet Row CARDIAC REHAB PHASE II ORIENTATION from 12/11/2023 in [place] CARDIAC REHABILITATION  Date 12/11/23    Visit Diagnosis: Heart failure, chronic systolic (HCC)  Patient's Home Medications on Admission:  Current Outpatient Medications:    acetaminophen  (TYLENOL ) 500 MG tablet, Take 2 tablets (1,000 mg total) by mouth every 6 (six) hours as needed for mild pain., Disp: , Rfl:    apixaban (ELIQUIS) 5 MG TABS tablet, Take 5 mg by mouth 2 (two) times daily., Disp: , Rfl:    atorvastatin  (LIPITOR ) 80 MG tablet, Take 1 tablet (80 mg total) by mouth daily., Disp: 30 tablet, Rfl: 11   cholecalciferol  (VITAMIN D ) 1000 UNITS tablet, Take 1,000 Units by mouth at bedtime. , Disp: , Rfl:    empagliflozin (JARDIANCE) 25 MG TABS tablet, Take 25 mg by mouth daily., Disp: , Rfl:    ezetimibe  (ZETIA ) 10 MG tablet, Take 10 mg by mouth daily., Disp: , Rfl:    finasteride (PROSCAR) 5 MG tablet, Take 5 mg by mouth daily., Disp: , Rfl:    metoprolol  succinate (TOPROL -XL) 25 MG 24 hr tablet, Take 25 mg by mouth every evening. , Disp: , Rfl:    nitroGLYCERIN  (NITROSTAT ) 0.4 MG SL tablet, Place 1 tablet (0.4 mg total) under the tongue every 5 (five) minutes as needed for chest pain., Disp: 25 tablet, Rfl: 3   ondansetron  (ZOFRAN ) 4 MG tablet, Take 1 tablet (4 mg total) by mouth every 6 (six) hours as needed for nausea., Disp: 14 tablet, Rfl: 0   valsartan  (DIOVAN ) 40 MG tablet, Take 1 tablet (40 mg total) by mouth at bedtime. Start after BMP test recheck on Monday 11/12/18 (Patient taking differently: Take 20 mg by mouth at bedtime.), Disp: 30 tablet, Rfl: 1   benzonatate  (TESSALON ) 100 MG  capsule, Take 1 capsule (100 mg total) by mouth every 8 (eight) hours. (Patient not taking: Reported on 12/11/2023), Disp: 30 capsule, Rfl: 0   clopidogrel  (PLAVIX ) 75 MG tablet, Take 1 tablet (75 mg total) by mouth daily. Start on Sunday 11/11/18 (Patient not taking: Reported on 12/11/2023), Disp: 30 tablet, Rfl: 2   furosemide  (LASIX ) 40 MG tablet, Take 0.5 tablets (20 mg total) by mouth daily. Start after BMP test recheck on Monday 11/12/18 (Patient not taking: Reported on 12/11/2023), Disp: 30 tablet, Rfl: 1   oxyCODONE  (OXY IR/ROXICODONE ) 5 MG immediate release tablet, Take 1 tablet (5 mg total) by mouth every 6 (six) hours as needed for severe pain. (Patient not taking: Reported on 12/11/2023), Disp: 15 tablet, Rfl: 0   spironolactone (ALDACTONE) 25 MG tablet, Take 25 mg by mouth 2 (two) times daily. (Patient not taking: Reported on 12/11/2023), Disp: , Rfl:    tamsulosin  (FLOMAX ) 0.4 MG CAPS capsule, Take 0.4 mg by mouth every morning. (Patient not taking: Reported on 12/11/2023), Disp: , Rfl:   Past Medical History: Past Medical History:  Diagnosis Date   Chronic combined systolic and diastolic CHF, NYHA class 3 (HCC)    a. 01/2015 Echo: EF 35-30%; b. 03/2016 Echo: Ef 20%, Gr1DD, mid-apical anterior, mid anteroseptal, apical inferior, basal inferolateral, mid anterolateral, and apical AK, basal anteroseptal and mid inferior severe HK, midlly dil LA, mild TR.  CKD (chronic kidney disease), stage III (HCC) 04/15/2016   Coronary artery disease    a. 1997 s/p CABG;  b. 01/2015 MI/PCI: G->PDA;  c. 03/2016 NSTEMI/PCI: LM 20, LAD 165m, D2 80, RI 60, LCX ok, OM1 90, RCA 60p, 140m, RPAV 80, G->RPDA 15p ISR, 90d (3.5x28 Promus Premier DES), LIMA->OM1->dLAD nl.   High cholesterol    Hypertension    Ischemic cardiomyopathy    a. 01/2015 Echo: EF 25-30%; b. s/p AICD;  c. 03/2016 Echo: Ef 20%, Gr1DD.   Smoker     Tobacco Use: Social History   Tobacco Use  Smoking Status Some Days   Types: Cigars,  Cigarettes  Smokeless Tobacco Former   Types: Snuff  Tobacco Comments   Discussed cessation and shared we have classes that he could attend.     Labs: Review Flowsheet       Latest Ref Rng & Units 02/12/2015 04/16/2016  Labs for ITP Cardiac and Pulmonary Rehab  Cholestrol 0 - 200 mg/dL - 842   LDL (calc) 0 - 99 mg/dL - 95   HDL-C >59 mg/dL - 43   Trlycerides <849 mg/dL - 96   Hemoglobin J8r 4.8 - 5.6 % - 5.6   TCO2 0 - 100 mmol/L 18  -     Exercise Target Goals: Exercise Program Goal: Individual exercise prescription set using results from initial 6 min walk test and THRR while considering  patient's activity barriers and safety.   Exercise Prescription Goal: Initial exercise prescription builds to 30-45 minutes a day of aerobic activity, 2-3 days per week.  Home exercise guidelines will be given to patient during program as part of exercise prescription that the participant will acknowledge.   Education: Aerobic Exercise: - Group verbal and visual presentation on the components of exercise prescription. Introduces F.I.T.T principle from ACSM for exercise prescriptions.  Reviews F.I.T.T. principles of aerobic exercise including progression. Written material provided at class time.   Education: Resistance Exercise: - Group verbal and visual presentation on the components of exercise prescription. Introduces F.I.T.T principle from ACSM for exercise prescriptions  Reviews F.I.T.T. principles of resistance exercise including progression. Written material provided at class time.    Education: Exercise & Equipment Safety: - Individual verbal instruction and demonstration of equipment use and safety with use of the equipment.   Education: Exercise Physiology & General Exercise Guidelines: - Group verbal and written instruction with models to review the exercise physiology of the cardiovascular system and associated critical values. Provides general exercise guidelines with  specific guidelines to those with heart or lung disease. Written material provided at class time.   Education: Flexibility, Balance, Mind/Body Relaxation: - Group verbal and visual presentation with interactive activity on the components of exercise prescription. Introduces F.I.T.T principle from ACSM for exercise prescriptions. Reviews F.I.T.T. principles of flexibility and balance exercise training including progression. Also discusses the mind body connection.  Reviews various relaxation techniques to help reduce and manage stress (i.e. Deep breathing, progressive muscle relaxation, and visualization). Balance handout provided to take home. Written material provided at class time.   Activity Barriers & Risk Stratification:  Activity Barriers & Cardiac Risk Stratification - 12/08/23 1448       Activity Barriers & Cardiac Risk Stratification   Activity Barriers None    Cardiac Risk Stratification High          6 Minute Walk:  6 Minute Walk     Row Name 12/11/23 0914         6 Minute Walk  Phase Initial     Distance 1505 feet     Walk Time 6 minutes     # of Rest Breaks 0     MPH 2.85     METS 3.09     RPE 11     VO2 Peak 10.83     Symptoms Yes (comment)     Comments chronic back pain 2/10     Resting HR 62 bpm     Resting BP 96/70     Resting Oxygen Saturation  97 %     Exercise Oxygen Saturation  during 6 min walk 96 %     Max Ex. HR 99 bpm     Max Ex. BP 166/74     2 Minute Post BP 132/72        Oxygen Initial Assessment:   Oxygen Re-Evaluation:   Oxygen Discharge (Final Oxygen Re-Evaluation):   Initial Exercise Prescription:  Initial Exercise Prescription - 12/11/23 0900       Date of Initial Exercise RX and Referring Provider   Date 12/11/23    Referring Provider Tobie Pamella Guadalajara MD      Oxygen   Maintain Oxygen Saturation 88% or higher      Treadmill   MPH 2.7    Grade 1    Minutes 15    METs 3.44      REL-XR   Level 3    Speed 50     Minutes 15    METs 3      Prescription Details   Frequency (times per week) 3    Duration Progress to 30 minutes of continuous aerobic without signs/symptoms of physical distress      Intensity   THRR 40-80% of Max Heartrate 94-126    Ratings of Perceived Exertion 11-13    Perceived Dyspnea 0-4      Progression   Progression Continue to progress workloads to maintain intensity without signs/symptoms of physical distress.      Resistance Training   Training Prescription Yes    Weight 4 lb    Reps 10-15          Perform Capillary Blood Glucose checks as needed.  Exercise Prescription Changes:   Exercise Prescription Changes     Row Name 12/11/23 0900             Response to Exercise   Blood Pressure (Admit) 96/70       Blood Pressure (Exercise) 166/74       Blood Pressure (Exit) 132/72       Heart Rate (Admit) 62 bpm       Heart Rate (Exercise) 99 bpm       Heart Rate (Exit) 64 bpm       Oxygen Saturation (Admit) 97 %       Oxygen Saturation (Exercise) 96 %       Rating of Perceived Exertion (Exercise) 11       Symptoms chronic back pain 2/10       Comments walk test results          Exercise Comments:   Exercise Goals and Review:   Exercise Goals     Row Name 12/11/23 0917             Exercise Goals   Increase Physical Activity Yes       Intervention Provide advice, education, support and counseling about physical activity/exercise needs.;Develop an individualized exercise prescription for aerobic and resistive training based on initial evaluation  findings, risk stratification, comorbidities and participant's personal goals.       Expected Outcomes Short Term: Attend rehab on a regular basis to increase amount of physical activity.;Long Term: Exercising regularly at least 3-5 days a week.;Long Term: Add in home exercise to make exercise part of routine and to increase amount of physical activity.       Increase Strength and Stamina Yes        Intervention Develop an individualized exercise prescription for aerobic and resistive training based on initial evaluation findings, risk stratification, comorbidities and participant's personal goals.;Provide advice, education, support and counseling about physical activity/exercise needs.       Expected Outcomes Short Term: Increase workloads from initial exercise prescription for resistance, speed, and METs.;Short Term: Perform resistance training exercises routinely during rehab and add in resistance training at home;Long Term: Improve cardiorespiratory fitness, muscular endurance and strength as measured by increased METs and functional capacity ( )       Able to understand and use rate of perceived exertion (RPE) scale Yes       Intervention Provide education and explanation on how to use RPE scale       Expected Outcomes Short Term: Able to use RPE daily in rehab to express subjective intensity level;Long Term:  Able to use RPE to guide intensity level when exercising independently       Able to understand and use Dyspnea scale Yes       Intervention Provide education and explanation on how to use Dyspnea scale       Expected Outcomes Short Term: Able to use Dyspnea scale daily in rehab to express subjective sense of shortness of breath during exertion;Long Term: Able to use Dyspnea scale to guide intensity level when exercising independently       Knowledge and understanding of Target Heart Rate Range (THRR) Yes       Intervention Provide education and explanation of THRR including how the numbers were predicted and where they are located for reference       Expected Outcomes Short Term: Able to state/look up THRR;Long Term: Able to use THRR to govern intensity when exercising independently;Short Term: Able to use daily as guideline for intensity in rehab       Able to check pulse independently Yes       Intervention Provide education and demonstration on how to check pulse in carotid and  radial arteries.;Review the importance of being able to check your own pulse for safety during independent exercise       Expected Outcomes Short Term: Able to explain why pulse checking is important during independent exercise;Long Term: Able to check pulse independently and accurately       Understanding of Exercise Prescription Yes       Intervention Provide education, explanation, and written materials on patient's individual exercise prescription       Expected Outcomes Short Term: Able to explain program exercise prescription;Long Term: Able to explain home exercise prescription to exercise independently          Exercise Goals Re-Evaluation :   Discharge Exercise Prescription (Final Exercise Prescription Changes):  Exercise Prescription Changes - 12/11/23 0900       Response to Exercise   Blood Pressure (Admit) 96/70    Blood Pressure (Exercise) 166/74    Blood Pressure (Exit) 132/72    Heart Rate (Admit) 62 bpm    Heart Rate (Exercise) 99 bpm    Heart Rate (Exit) 64 bpm    Oxygen  Saturation (Admit) 97 %    Oxygen Saturation (Exercise) 96 %    Rating of Perceived Exertion (Exercise) 11    Symptoms chronic back pain 2/10    Comments walk test results          Nutrition:  Target Goals: Understanding of nutrition guidelines, daily intake of sodium 1500mg , cholesterol 200mg , calories 30% from fat and 7% or less from saturated fats, daily to have 5 or more servings of fruits and vegetables.  Education: Nutrition 1 -Group instruction provided by verbal, written material, interactive activities, discussions, models, and posters to present general guidelines for heart healthy nutrition including macronutrients, label reading, and promoting whole foods over processed counterparts. Education serves as Pensions consultant of discussion of heart healthy eating for all. Written material provided at class time.    Education: Nutrition 2 -Group instruction provided by verbal, written  material, interactive activities, discussions, models, and posters to present general guidelines for heart healthy nutrition including sodium, cholesterol, and saturated fat. Providing guidance of habit forming to improve blood pressure, cholesterol, and body weight. Written material provided at class time.     Biometrics:  Pre Biometrics - 12/11/23 0917       Pre Biometrics   Height 6' 1 (1.854 m)    Weight 98.3 kg    Waist Circumference 40 inches    Hip Circumference 41.5 inches    Waist to Hip Ratio 0.96 %    BMI (Calculated) 28.6    Grip Strength 39.7 kg    Single Leg Stand 30 seconds           Nutrition Therapy Plan and Nutrition Goals:   Nutrition Assessments:  MEDIFICTS Score Key: >=70 Need to make dietary changes  40-70 Heart Healthy Diet <= 40 Therapeutic Level Cholesterol Diet  Flowsheet Row CARDIAC REHAB PHASE II ORIENTATION from 12/11/2023 in [facility] CARDIAC REHABILITATION  Picture Your Plate Total Score on Admission 30   Picture Your Plate Scores: <59 Unhealthy dietary pattern with much room for improvement. 41-50 Dietary pattern unlikely to meet recommendations for good health and room for improvement. 51-60 More healthful dietary pattern, with some room for improvement.  >60 Healthy dietary pattern, although there may be some specific behaviors that could be improved.    Nutrition Goals Re-Evaluation:   Nutrition Goals Discharge (Final Nutrition Goals Re-Evaluation):   Psychosocial: Target Goals: Acknowledge presence or absence of significant depression and/or stress, maximize coping skills, provide positive support system. Participant is able to verbalize types and ability to use techniques and skills needed for reducing stress and depression.   Education: Stress, Anxiety, and Depression - Group verbal and visual presentation to define topics covered.  Reviews how body is impacted by stress, anxiety, and depression.  Also discusses healthy ways  to reduce stress and to treat/manage anxiety and depression. Written material provided at class time.   Education: Sleep Hygiene -Provides group verbal and written instruction about how sleep can affect your health.  Define sleep hygiene, discuss sleep cycles and impact of sleep habits. Review good sleep hygiene tips.   Initial Review & Psychosocial Screening:  Initial Psych Review & Screening - 12/08/23 1458       Initial Review   Current issues with None Identified      Family Dynamics   Good Support System? Yes    Comments Patient's wife and 2 children support him.      Barriers   Psychosocial barriers to participate in program The patient should benefit from training in  stress management and relaxation.;There are no identifiable barriers or psychosocial needs.      Screening Interventions   Interventions To provide support and resources with identified psychosocial needs;Encouraged to exercise;Program counselor consult;Provide feedback about the scores to participant    Expected Outcomes Short Term goal: Utilizing psychosocial counselor, staff and physician to assist with identification of specific Stressors or current issues interfering with healing process. Setting desired goal for each stressor or current issue identified.;Long Term Goal: Stressors or current issues are controlled or eliminated.;Short Term goal: Identification and review with participant of any Quality of Life or Depression concerns found by scoring the questionnaire.;Long Term goal: The participant improves quality of Life and PHQ9 Scores as seen by post scores and/or verbalization of changes          Quality of Life Scores:   Quality of Life - 12/11/23 0917       Quality of Life   Select Quality of Life      Quality of Life Scores   Health/Function Pre 21.83 %    Socioeconomic Pre 25.71 %    Psych/Spiritual Pre 20.79 %    Family Pre 28.8 %    GLOBAL Pre 23.44 %         Scores of 19 and below  usually indicate a poorer quality of life in these areas.  A difference of  2-3 points is a clinically meaningful difference.  A difference of 2-3 points in the total score of the Quality of Life Index has been associated with significant improvement in overall quality of life, self-image, physical symptoms, and general health in studies assessing change in quality of life.  PHQ-9: Review Flowsheet  More data may exist      12/11/2023 11/02/2016 07/21/2016 11/05/2015 06/16/2015  Depression screen PHQ 2/9  Decreased Interest 1 1 1 1 1   Down, Depressed, Hopeless 0 0 0 0 1  PHQ - 2 Score 1 1 1 1 2   Altered sleeping 0 1 0 0 0  Tired, decreased energy 1 2 2 1  0  Change in appetite 0 0 0 0 0  Feeling bad or failure about yourself  0 0 0 0 0  Trouble concentrating 0 0 0 0 0  Moving slowly or fidgety/restless 0 0 0 0 0  Suicidal thoughts 0 0 0  0  0   PHQ-9 Score 2 4 3 2 2   Difficult doing work/chores Not difficult at all Somewhat difficult Somewhat difficult Somewhat difficult Somewhat difficult    Details       Data saved with a previous flowsheet row definition        Interpretation of Total Score  Total Score Depression Severity:  1-4 = Minimal depression, 5-9 = Mild depression, 10-14 = Moderate depression, 15-19 = Moderately severe depression, 20-27 = Severe depression   Psychosocial Evaluation and Intervention:  Psychosocial Evaluation - 12/08/23 1458       Psychosocial Evaluation & Interventions   Interventions Encouraged to exercise with the program and follow exercise prescription;Relaxation education;Stress management education    Comments Patient was referred to cardiac rehab from the [facility] with chronic systolic HF. He has completed the program in 2018 here at [facility]. He denies any depression, anxiety or current stressors. He says he sleeps well. He has OSA but does not use CPAP. 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 He is  currently smoking 10 cigars/week and has no interest  in quitting at this time. He enjoys playing the guitar and spending time with his grandchildren. His goals for the program are to increase his energy; get stronger; and get back to doing his normal activities. He has no barriers identified to complete the program.    Expected Outcomes Short Term: Patient will start the program and attend consistently. Long Term: Patient will complete the program meeting personal goals.    Continue Psychosocial Services  Follow up required by staff          Psychosocial Re-Evaluation:   Psychosocial Discharge (Final Psychosocial Re-Evaluation):   Vocational Rehabilitation: Provide vocational rehab assistance to qualifying candidates.   Vocational Rehab Evaluation & Intervention:  Vocational Rehab - 12/08/23 1436       Initial Vocational Rehab Evaluation & Intervention   Assessment shows need for Vocational Rehabilitation No          Education: Education Goals: Education classes will be provided on a variety of topics geared toward better understanding of heart health and risk factor modification. Participant will state understanding/return demonstration of topics presented as noted by education test scores.  Learning Barriers/Preferences:  Learning Barriers/Preferences - 12/08/23 1436       Learning Barriers/Preferences   Learning Barriers None    Learning Preferences Skilled Demonstration;Individual Instruction;Group Instruction          General Cardiac Education Topics:  AED/CPR: - Group verbal and written instruction with the use of models to demonstrate the basic use of the AED with the basic ABC's of resuscitation.   Test and Procedures: - Group verbal and visual presentation and models provide information about basic cardiac anatomy and function. Reviews the testing methods done to diagnose heart disease and the outcomes of the test results. Describes the treatment choices:  Medical Management, Angioplasty, or Coronary Bypass Surgery for treating various heart conditions including Myocardial Infarction, Angina, Valve Disease, and Cardiac Arrhythmias. Written material provided at class time.   Medication Safety: - Group verbal and visual instruction to review commonly prescribed medications for heart and lung disease. Reviews the medication, class of the drug, and side effects. Includes the steps to properly store meds and maintain the prescription regimen. Written material provided at class time.   Intimacy: - Group verbal instruction through game format to discuss how heart and lung disease can affect sexual intimacy. Written material provided at class time.   Know Your Numbers and Heart Failure: - Group verbal and visual instruction to discuss disease risk factors for cardiac and pulmonary disease and treatment options.  Reviews associated critical values for Overweight/Obesity, Hypertension, Cholesterol, and Diabetes.  Discusses basics of heart failure: signs/symptoms and treatments.  Introduces Heart Failure Zone chart for action plan for heart failure. Written material provided at class time.   Infection Prevention: - Provides verbal and written material to individual with discussion of infection control including proper hand washing and proper equipment cleaning during exercise session.   Falls Prevention: - Provides verbal and written material to individual with discussion of falls prevention and safety.   Other: -Provides group and verbal instruction on various topics (see comments)   Knowledge Questionnaire Score:   Core Components/Risk Factors/Patient Goals at Admission:  Personal Goals and Risk Factors at Admission - 12/11/23 0918       Core Components/Risk Factors/Patient Goals on Admission    Weight Management Yes;Weight Loss    Intervention Weight Management: Develop a combined nutrition and exercise program designed to reach desired  caloric intake, while maintaining appropriate intake of  nutrient and fiber, sodium and fats, and appropriate energy expenditure required for the weight goal.;Weight Management: Provide education and appropriate resources to help participant work on and attain dietary goals.;Weight Management/Obesity: Establish reasonable short term and long term weight goals.    Admit Weight 216 lb 11.2 oz (98.3 kg)    Goal Weight: Short Term 211 lb (95.7 kg)    Goal Weight: Long Term 200 lb (90.7 kg)    Expected Outcomes Short Term: Continue to assess and modify interventions until short term weight is achieved;Long Term: Adherence to nutrition and physical activity/exercise program aimed toward attainment of established weight goal;Weight Loss: Understanding of general recommendations for a balanced deficit meal plan, which promotes 1-2 lb weight loss per week and includes a negative energy balance of [phone] kcal/d;Understanding recommendations for meals to include 15-35% energy as protein, 25-35% energy from fat, 35-60% energy from carbohydrates, less than 200mg  of dietary cholesterol, 20-35 gm of total fiber daily;Understanding of distribution of calorie intake throughout the day with the consumption of 4-5 meals/snacks    Tobacco Cessation Yes    Intervention Assist the participant in steps to quit. Provide individualized education and counseling about committing to Tobacco Cessation, relapse prevention, and pharmacological support that can be provided by physician.;Education officer, environmental, assist with locating and accessing local/national Quit Smoking programs, and support quit date choice.    Expected Outcomes Short Term: Will demonstrate readiness to quit, by selecting a quit date.;Long Term: Complete abstinence from all tobacco products for at least 12 months from quit date.;Short Term: Will quit all tobacco product use, adhering to prevention of relapse plan.    Heart Failure Yes    Intervention Provide a  combined exercise and nutrition program that is supplemented with education, support and counseling about heart failure. Directed toward relieving symptoms such as shortness of breath, decreased exercise tolerance, and extremity edema.    Expected Outcomes Improve functional capacity of life;Short term: Attendance in program 2-3 days a week with increased exercise capacity. Reported lower sodium intake. Reported increased fruit and vegetable intake. Reports medication compliance.;Short term: Daily weights obtained and reported for increase. Utilizing diuretic protocols set by physician.;Long term: Adoption of self-care skills and reduction of barriers for early signs and symptoms recognition and intervention leading to self-care maintenance.    Hypertension Yes    Intervention Provide education on lifestyle modifcations including regular physical activity/exercise, weight management, moderate sodium restriction and increased consumption of fresh fruit, vegetables, and low fat dairy, alcohol moderation, and smoking cessation.;Monitor prescription use compliance.    Expected Outcomes Short Term: Continued assessment and intervention until BP is < 140/35mm HG in hypertensive participants. < 130/62mm HG in hypertensive participants with diabetes, heart failure or chronic kidney disease.;Long Term: Maintenance of blood pressure at goal levels.    Lipids Yes    Intervention Provide education and support for participant on nutrition & aerobic/resistive exercise along with prescribed medications to achieve LDL 70mg , HDL >40mg .    Expected Outcomes Short Term: Participant states understanding of desired cholesterol values and is compliant with medications prescribed. Participant is following exercise prescription and nutrition guidelines.;Long Term: Cholesterol controlled with medications as prescribed, with individualized exercise RX and with personalized nutrition plan. Value goals: LDL < 70mg , HDL > 40 mg.           Education:Diabetes - Individual verbal and written instruction to review signs/symptoms of diabetes, desired ranges of glucose level fasting, after meals and with exercise. Acknowledge that pre and post exercise glucose checks will  be done for 3 sessions at entry of program.   Core Components/Risk Factors/Patient Goals Review:    Core Components/Risk Factors/Patient Goals at Discharge (Final Review):    ITP Comments:  ITP Comments     Row Name 12/08/23 1506 12/11/23 0923         ITP Comments Virtual orientation visit completed for cardiac rehab with chronic systolic HF. On-site orientation visit scheduled for 12/11/23 at 0800. Patient arrived for 1st visit/orientation/education at 0800. Patient was referred to CR by Dr. Pamella Blanch at the [facility] due to Chronic systolic HF. During orientation advised patient on arrival and appointment times what to wear, what to do before, during and after exercise. Reviewed attendance and class policy.  Pt is scheduled to return Cardiac Rehab on 12/13/23 at 915. Pt was advised to come to class 15 minutes before class starts.  Discussed RPE/Dpysnea scales. Patient participated in warm up stretches. Patient was able to complete 6 minute walk test.  Telemetry:NSR. Patient was measured for the equipment. Discussed equipment safety with patient. Took patient pre-anthropometric measurements. Patient finished visit at 915.         Comments: Patient arrived for 1st visit/orientation/education at 0800. Patient was referred to CR by Dr. Pamella Blanch at the [facility] due to Chronic systolic HF. During orientation advised patient on arrival and appointment times what to wear, what to do before, during and after exercise. Reviewed attendance and class policy.  Pt is scheduled to return Cardiac Rehab on 12/13/23 at 915. Pt was advised to come to class 15 minutes before class starts.  Discussed RPE/Dpysnea scales. Patient participated in warm up stretches. Patient was able to  complete 6 minute walk test.  Telemetry:NSR. Patient was measured for the equipment. Discussed equipment safety with patient. Took patient pre-anthropometric measurements. Patient finished visit at 915.

## 2023-12-13 ENCOUNTER — Encounter (HOSPITAL_COMMUNITY)
Admission: RE | Admit: 2023-12-13 | Discharge: 2023-12-13 | Discharge status: Home / Self Care | Source: Ambulatory Visit | Attending: Internal Medicine

## 2023-12-13 DIAGNOSIS — I5022 Chronic systolic (congestive) heart failure: Secondary | ICD-10-CM | POA: Diagnosis not present

## 2023-12-13 NOTE — Progress Notes (Signed)
 Daily Session Note  Patient Details  Name: CAIDE CAMPI MRN: [id number] Date of Birth: 01-23-1946 Referring Provider:   Flowsheet Row CARDIAC REHAB PHASE II ORIENTATION from 12/11/2023 in [facility] CARDIAC REHABILITATION  Referring Provider Tobie Pamella Guadalajara MD    Encounter Date: 12/13/2023  Check In:  Session Check In - 12/13/23 0915       Check-In   Supervising physician immediately available to respond to emergencies See telemetry face sheet for immediately available MD    Location [facility]    Staff Present Powell Benders, BS, Exercise Physiologist;Jessica Vonzell, MA, RCEP, CCRP, CCET    Virtual Visit No    Medication changes reported     No    Fall or balance concerns reported    No    Tobacco Cessation No Change    Warm-up and Cool-down Performed on first and last piece of equipment    Resistance Training Performed Yes    VAD Patient? No    PAD/SET Patient? No      Pain Assessment   Currently in Pain? No/denies    Multiple Pain Sites No          Capillary Blood Glucose: No results found for this or any previous visit (from the past 24 hours).    Social History   Tobacco Use  Smoking Status Some Days   Types: Cigars, Cigarettes  Smokeless Tobacco Former   Types: Snuff  Tobacco Comments   Discussed cessation and shared we have classes that he could attend.     Goals Met:  Independence with exercise equipment Exercise tolerated well No report of concerns or symptoms today Strength training completed today  Goals Unmet:  Not Applicable  Comments: First full day of exercise!  Patient was oriented to gym and equipment including functions, settings, policies, and procedures.  Patient's individual exercise prescription and treatment plan were reviewed.  All starting workloads were established based on the results of the 6 minute walk test done at initial orientation visit.  The plan for exercise progression was also introduced and  progression will be customized based on patient's performance and goals.

## 2023-12-15 ENCOUNTER — Encounter (HOSPITAL_COMMUNITY)
Admission: RE | Admit: 2023-12-15 | Discharge: 2023-12-15 | Disposition: A | Discharge status: Home / Self Care | Source: Ambulatory Visit | Attending: Internal Medicine | Admitting: Internal Medicine

## 2023-12-15 DIAGNOSIS — I5022 Chronic systolic (congestive) heart failure: Secondary | ICD-10-CM | POA: Diagnosis not present

## 2023-12-15 NOTE — Progress Notes (Signed)
 Daily Session Note  Patient Details  Name: ELIGAH ANELLO MRN: [id number] Date of Birth: [birth year] Referring Provider:   Flowsheet Row CARDIAC REHAB PHASE II ORIENTATION from 12/11/2023 in [facility] CARDIAC REHABILITATION  Referring Provider Tobie Pamella Guadalajara MD    Encounter Date: 12/15/2023  Check In:  Session Check In - 12/15/23 0936       Check-In   Supervising physician immediately available to respond to emergencies See telemetry face sheet for immediately available MD    Location [facility]    Staff Present Ronal Idell Glen, RN, BSN, Regena Fitting, RN, BSN;Halimah Bewick, MA, RCEP, CCRP, CCET    Virtual Visit No    Medication changes reported     No    Fall or balance concerns reported    No    Warm-up and Cool-down Performed on first and last piece of equipment    Resistance Training Performed Yes    VAD Patient? No    PAD/SET Patient? No      Pain Assessment   Currently in Pain? No/denies          Capillary Blood Glucose: No results found for this or any previous visit (from the past 24 hours).    Social History   Tobacco Use  Smoking Status Some Days   Types: Cigars, Cigarettes  Smokeless Tobacco Former   Types: Snuff  Tobacco Comments   Discussed cessation and shared we have classes that he could attend.     Goals Met:  Independence with exercise equipment Exercise tolerated well No report of concerns or symptoms today Strength training completed today  Goals Unmet:  Not Applicable  Comments: Pt able to follow exercise prescription today without complaint.  Will continue to monitor for progression.

## 2023-12-20 ENCOUNTER — Encounter (HOSPITAL_COMMUNITY)
Admission: RE | Admit: 2023-12-20 | Discharge: 2023-12-20 | Disposition: A | Discharge status: Home / Self Care | Source: Ambulatory Visit | Attending: Internal Medicine | Admitting: Internal Medicine

## 2023-12-20 DIAGNOSIS — I5022 Chronic systolic (congestive) heart failure: Secondary | ICD-10-CM | POA: Diagnosis present

## 2023-12-20 NOTE — Progress Notes (Signed)
 Daily Session Note  Patient Details  Name: Stephen Macdonald MRN: [id number] Date of Birth: [birth year] Referring Provider:   Flowsheet Row CARDIAC REHAB PHASE II ORIENTATION from 12/11/2023 in [facility] CARDIAC REHABILITATION  Referring Provider Tobie Pamella Guadalajara MD    Encounter Date: 12/20/2023  Check In:  Session Check In - 12/20/23 0935       Check-In   Supervising physician immediately available to respond to emergencies See telemetry face sheet for immediately available MD    Location [facility]    Staff Present Powell Benders, BS, Exercise Physiologist;Jessica Vonzell, MA, RCEP, CCRP, CCET;Brittany Jackquline, BSN, RN, WTA-C    Virtual Visit No    Medication changes reported     No    Fall or balance concerns reported    No    Tobacco Cessation No Change    Warm-up and Cool-down Performed on first and last piece of equipment    Resistance Training Performed Yes    VAD Patient? No    PAD/SET Patient? No      Pain Assessment   Currently in Pain? No/denies    Multiple Pain Sites No          Capillary Blood Glucose: No results found for this or any previous visit (from the past 24 hours).    Social History   Tobacco Use  Smoking Status Some Days   Types: Cigars, Cigarettes  Smokeless Tobacco Former   Types: Snuff  Tobacco Comments   Discussed cessation and shared we have classes that he could attend.     Goals Met:  Independence with exercise equipment Exercise tolerated well No report of concerns or symptoms today Strength training completed today  Goals Unmet:  Not Applicable  Comments: Pt able to follow exercise prescription today without complaint.  Will continue to monitor for progression.

## 2023-12-22 ENCOUNTER — Encounter (HOSPITAL_COMMUNITY)
Admission: RE | Admit: 2023-12-22 | Discharge: 2023-12-22 | Disposition: A | Discharge status: Home / Self Care | Source: Ambulatory Visit | Attending: Internal Medicine | Admitting: Internal Medicine

## 2023-12-22 DIAGNOSIS — I5022 Chronic systolic (congestive) heart failure: Secondary | ICD-10-CM | POA: Diagnosis not present

## 2023-12-22 NOTE — Progress Notes (Signed)
 Daily Session Note  Patient Details  Name: Stephen Macdonald MRN: [id number] Date of Birth: 10-08-1945 Referring Provider:   Flowsheet Row CARDIAC REHAB PHASE II ORIENTATION from 12/11/2023 in [facility] CARDIAC REHABILITATION  Referring Provider Tobie Pamella Guadalajara MD    Encounter Date: 12/22/2023  Check In:  Session Check In - 12/22/23 0919       Check-In   Supervising physician immediately available to respond to emergencies See telemetry face sheet for immediately available MD    Location [facility]    Staff Present Powell Benders, BS, Exercise Physiologist;Brittany Jackquline, BSN, RN, WTA-C    Virtual Visit No    Medication changes reported     No    Fall or balance concerns reported    No    Tobacco Cessation No Change    Warm-up and Cool-down Performed on first and last piece of equipment    Resistance Training Performed Yes    VAD Patient? No    PAD/SET Patient? No      Pain Assessment   Currently in Pain? No/denies    Multiple Pain Sites No          Capillary Blood Glucose: No results found for this or any previous visit (from the past 24 hours).    Social History   Tobacco Use  Smoking Status Some Days   Types: Cigars, Cigarettes  Smokeless Tobacco Former   Types: Snuff  Tobacco Comments   Discussed cessation and shared we have classes that he could attend.     Goals Met:  Independence with exercise equipment Exercise tolerated well No report of concerns or symptoms today Strength training completed today  Goals Unmet:  Not Applicable  Comments: Pt able to follow exercise prescription today without complaint.  Will continue to monitor for progression.

## 2023-12-25 ENCOUNTER — Encounter (HOSPITAL_COMMUNITY)
Admission: RE | Admit: 2023-12-25 | Discharge: 2023-12-25 | Disposition: A | Discharge status: Home / Self Care | Source: Ambulatory Visit | Attending: Internal Medicine | Admitting: Internal Medicine

## 2023-12-25 DIAGNOSIS — I5022 Chronic systolic (congestive) heart failure: Secondary | ICD-10-CM | POA: Diagnosis not present

## 2023-12-25 NOTE — Progress Notes (Signed)
 Daily Session Note  Patient Details  Name: LINKON SIVERSON MRN: [id number] Date of Birth: Jan 19, 1946 Referring Provider:   Flowsheet Row CARDIAC REHAB PHASE II ORIENTATION from 12/11/2023 in [facility] CARDIAC REHABILITATION  Referring Provider Tobie Pamella Guadalajara MD    Encounter Date: 12/25/2023  Check In:  Session Check In - 12/25/23 0921       Check-In   Supervising physician immediately available to respond to emergencies See telemetry face sheet for immediately available MD    Location [facility]    Staff Present Harlene Gelineau, MA, RCEP, CCRP, CCET;Ariana Cavenaugh Jackquline, BSN, RN, WTA-C    Virtual Visit No    Medication changes reported     No    Fall or balance concerns reported    No    Tobacco Cessation No Change    Warm-up and Cool-down Performed on first and last piece of equipment    Resistance Training Performed Yes    VAD Patient? No    PAD/SET Patient? No      Pain Assessment   Currently in Pain? No/denies          Capillary Blood Glucose: No results found for this or any previous visit (from the past 24 hours).    Social History   Tobacco Use  Smoking Status Some Days   Types: Cigars, Cigarettes  Smokeless Tobacco Former   Types: Snuff  Tobacco Comments   Discussed cessation and shared we have classes that he could attend.     Goals Met:  Independence with exercise equipment Exercise tolerated well No report of concerns or symptoms today Strength training completed today  Goals Unmet:  Not Applicable  Comments: Pt able to follow exercise prescription today without complaint.  Will continue to monitor for progression.

## 2023-12-27 ENCOUNTER — Encounter (HOSPITAL_COMMUNITY): Payer: Self-pay | Admitting: *Deleted

## 2023-12-27 ENCOUNTER — Encounter (HOSPITAL_COMMUNITY)
Admission: RE | Admit: 2023-12-27 | Discharge: 2023-12-27 | Disposition: A | Discharge status: Home / Self Care | Source: Ambulatory Visit | Attending: Internal Medicine

## 2023-12-27 DIAGNOSIS — I5022 Chronic systolic (congestive) heart failure: Secondary | ICD-10-CM

## 2023-12-27 NOTE — Progress Notes (Signed)
 Cardiac Individual Treatment Plan  Patient Details  Name: Stephen Macdonald MRN: [id number] Date of Birth: 08-20-1945 Referring Provider:   Flowsheet Row CARDIAC REHAB PHASE II ORIENTATION from 12/11/2023 in [facility] CARDIAC REHABILITATION  Referring Provider Tobie Pamella Guadalajara MD    Initial Encounter Date:  Flowsheet Row CARDIAC REHAB PHASE II ORIENTATION from 12/11/2023 in [facility]  Date 12/11/23    Visit Diagnosis: Heart failure, chronic systolic (HCC)  Patient's Home Medications on Admission:  Current Outpatient Medications:    acetaminophen  (TYLENOL ) 500 MG tablet, Take 2 tablets (1,000 mg total) by mouth every 6 (six) hours as needed for mild pain., Disp: , Rfl:    apixaban (ELIQUIS) 5 MG TABS tablet, Take 5 mg by mouth 2 (two) times daily., Disp: , Rfl:    atorvastatin  (LIPITOR ) 80 MG tablet, Take 1 tablet (80 mg total) by mouth daily., Disp: 30 tablet, Rfl: 11   benzonatate  (TESSALON ) 100 MG capsule, Take 1 capsule (100 mg total) by mouth every 8 (eight) hours. (Patient not taking: Reported on 12/11/2023), Disp: 30 capsule, Rfl: 0   cholecalciferol  (VITAMIN D ) 1000 UNITS tablet, Take 1,000 Units by mouth at bedtime. , Disp: , Rfl:    clopidogrel  (PLAVIX ) 75 MG tablet, Take 1 tablet (75 mg total) by mouth daily. Start on Sunday 11/11/18 (Patient not taking: Reported on 12/11/2023), Disp: 30 tablet, Rfl: 2   empagliflozin (JARDIANCE) 25 MG TABS tablet, Take 25 mg by mouth daily., Disp: , Rfl:    ezetimibe  (ZETIA ) 10 MG tablet, Take 10 mg by mouth daily., Disp: , Rfl:    finasteride (PROSCAR) 5 MG tablet, Take 5 mg by mouth daily., Disp: , Rfl:    furosemide  (LASIX ) 40 MG tablet, Take 0.5 tablets (20 mg total) by mouth daily. Start after BMP test recheck on Monday 11/12/18 (Patient not taking: Reported on 12/11/2023), Disp: 30 tablet, Rfl: 1   metoprolol  succinate (TOPROL -XL) 25 MG 24 hr tablet, Take 25 mg by mouth every evening. , Disp: , Rfl:    nitroGLYCERIN   (NITROSTAT ) 0.4 MG SL tablet, Place 1 tablet (0.4 mg total) under the tongue every 5 (five) minutes as needed for chest pain., Disp: 25 tablet, Rfl: 3   ondansetron  (ZOFRAN ) 4 MG tablet, Take 1 tablet (4 mg total) by mouth every 6 (six) hours as needed for nausea., Disp: 14 tablet, Rfl: 0   oxyCODONE  (OXY IR/ROXICODONE ) 5 MG immediate release tablet, Take 1 tablet (5 mg total) by mouth every 6 (six) hours as needed for severe pain. (Patient not taking: Reported on 12/11/2023), Disp: 15 tablet, Rfl: 0   spironolactone (ALDACTONE) 25 MG tablet, Take 25 mg by mouth 2 (two) times daily. (Patient not taking: Reported on 12/11/2023), Disp: , Rfl:    tamsulosin  (FLOMAX ) 0.4 MG CAPS capsule, Take 0.4 mg by mouth every morning. (Patient not taking: Reported on 12/11/2023), Disp: , Rfl:    valsartan  (DIOVAN ) 40 MG tablet, Take 1 tablet (40 mg total) by mouth at bedtime. Start after BMP test recheck on Monday 11/12/18 (Patient taking differently: Take 20 mg by mouth at bedtime.), Disp: 30 tablet, Rfl: 1  Past Medical History: Past Medical History:  Diagnosis Date   Chronic combined systolic and diastolic CHF, NYHA class 3 (HCC)    a. 01/2015 Echo: EF 35-30%; b. 03/2016 Echo: Ef 20%, Gr1DD, mid-apical anterior, mid anteroseptal, apical inferior, basal inferolateral, mid anterolateral, and apical AK, basal anteroseptal and mid inferior severe HK, midlly dil LA, mild TR.  CKD (chronic kidney disease), stage III (HCC) 04/15/2016   Coronary artery disease    a. 1997 s/p CABG;  b. 01/2015 MI/PCI: G->PDA;  c. 03/2016 NSTEMI/PCI: LM 20, LAD 117m, D2 80, RI 60, LCX ok, OM1 90, RCA 60p, 188m, RPAV 80, G->RPDA 15p ISR, 90d (3.5x28 Promus Premier DES), LIMA->OM1->dLAD nl.   High cholesterol    Hypertension    Ischemic cardiomyopathy    a. 01/2015 Echo: EF 25-30%; b. s/p AICD;  c. 03/2016 Echo: Ef 20%, Gr1DD.   Smoker     Tobacco Use: Social History   Tobacco Use  Smoking Status Some Days   Types: Cigars, Cigarettes   Smokeless Tobacco Former   Types: Snuff  Tobacco Comments   Discussed cessation and shared we have classes that he could attend.     Labs: Review Flowsheet       Latest Ref Rng & Units 02/12/2015 04/16/2016  Labs for ITP Cardiac and Pulmonary Rehab  Cholestrol 0 - 200 mg/dL - 842   LDL (calc) 0 - 99 mg/dL - 95   HDL-C >59 mg/dL - 43   Trlycerides <849 mg/dL - 96   Hemoglobin J8r 4.8 - 5.6 % - 5.6   TCO2 0 - 100 mmol/L 18  -    Capillary Blood Glucose: No results found for: GLUCAP   Exercise Target Goals: Exercise Program Goal: Individual exercise prescription set using results from initial 6 min walk test and THRR while considering  patient's activity barriers and safety.   Exercise Prescription Goal: Starting with aerobic activity 30 plus minutes a day, 3 days per week for initial exercise prescription. Provide home exercise prescription and guidelines that participant acknowledges understanding prior to discharge.  Activity Barriers & Risk Stratification:  Activity Barriers & Cardiac Risk Stratification - 12/08/23 1448       Activity Barriers & Cardiac Risk Stratification   Activity Barriers None    Cardiac Risk Stratification High          6 Minute Walk:  6 Minute Walk     Row Name 12/11/23 0914         6 Minute Walk   Phase Initial     Distance 1505 feet     Walk Time 6 minutes     # of Rest Breaks 0     MPH 2.85     METS 3.09     RPE 11     VO2 Peak 10.83     Symptoms Yes (comment)     Comments chronic back pain 2/10     Resting HR 62 bpm     Resting BP 96/70     Resting Oxygen Saturation  97 %     Exercise Oxygen Saturation  during 6 min walk 96 %     Max Ex. HR 99 bpm     Max Ex. BP 166/74     2 Minute Post BP 132/72        Oxygen Initial Assessment:   Oxygen Re-Evaluation:   Oxygen Discharge (Final Oxygen Re-Evaluation):   Initial Exercise Prescription:  Initial Exercise Prescription - 12/11/23 0900       Date of Initial  Exercise RX and Referring Provider   Date 12/11/23    Referring Provider Tobie Pamella Guadalajara MD      Oxygen   Maintain Oxygen Saturation 88% or higher      Treadmill   MPH 2.7    Grade 1    Minutes 15  METs 3.44      REL-XR   Level 3    Speed 50    Minutes 15    METs 3      Prescription Details   Frequency (times per week) 3    Duration Progress to 30 minutes of continuous aerobic without signs/symptoms of physical distress      Intensity   THRR 40-80% of Max Heartrate 94-126    Ratings of Perceived Exertion 11-13    Perceived Dyspnea 0-4      Progression   Progression Continue to progress workloads to maintain intensity without signs/symptoms of physical distress.      Resistance Training   Training Prescription Yes    Weight 4 lb    Reps 10-15          Perform Capillary Blood Glucose checks as needed.  Exercise Prescription Changes:   Exercise Prescription Changes     Row Name 12/11/23 0900             Response to Exercise   Blood Pressure (Admit) 96/70       Blood Pressure (Exercise) 166/74       Blood Pressure (Exit) 132/72       Heart Rate (Admit) 62 bpm       Heart Rate (Exercise) 99 bpm       Heart Rate (Exit) 64 bpm       Oxygen Saturation (Admit) 97 %       Oxygen Saturation (Exercise) 96 %       Rating of Perceived Exertion (Exercise) 11       Symptoms chronic back pain 2/10       Comments walk test results          Exercise Comments:   Exercise Comments     Row Name 12/13/23 1036           Exercise Comments First full day of exercise!  Patient was oriented to gym and equipment including functions, settings, policies, and procedures.  Patient's individual exercise prescription and treatment plan were reviewed.  All starting workloads were established based on the results of the 6 minute walk test done at initial orientation visit.  The plan for exercise progression was also introduced and progression will be customized based on  patient's performance and goals.          Exercise Goals and Review:   Exercise Goals     Row Name 12/11/23 [phone]             Exercise Goals   Increase Physical Activity Yes       Intervention Provide advice, education, support and counseling about physical activity/exercise needs.;Develop an individualized exercise prescription for aerobic and resistive training based on initial evaluation findings, risk stratification, comorbidities and participant's personal goals.       Expected Outcomes Short Term: Attend rehab on a regular basis to increase amount of physical activity.;Long Term: Exercising regularly at least 3-5 days a week.;Long Term: Add in home exercise to make exercise part of routine and to increase amount of physical activity.       Increase Strength and Stamina Yes       Intervention Develop an individualized exercise prescription for aerobic and resistive training based on initial evaluation findings, risk stratification, comorbidities and participant's personal goals.;Provide advice, education, support and counseling about physical activity/exercise needs.       Expected Outcomes Short Term: Increase workloads from initial exercise prescription for resistance, speed, and METs.;Short Term:  Perform resistance training exercises routinely during rehab and add in resistance training at home;Long Term: Improve cardiorespiratory fitness, muscular endurance and strength as measured by increased METs and functional capacity ( )       Able to understand and use rate of perceived exertion (RPE) scale Yes       Intervention Provide education and explanation on how to use RPE scale       Expected Outcomes Short Term: Able to use RPE daily in rehab to express subjective intensity level;Long Term:  Able to use RPE to guide intensity level when exercising independently       Able to understand and use Dyspnea scale Yes       Intervention Provide education and explanation on how to use  Dyspnea scale       Expected Outcomes Short Term: Able to use Dyspnea scale daily in rehab to express subjective sense of shortness of breath during exertion;Long Term: Able to use Dyspnea scale to guide intensity level when exercising independently       Knowledge and understanding of Target Heart Rate Range (THRR) Yes       Intervention Provide education and explanation of THRR including how the numbers were predicted and where they are located for reference       Expected Outcomes Short Term: Able to state/look up THRR;Long Term: Able to use THRR to govern intensity when exercising independently;Short Term: Able to use daily as guideline for intensity in rehab       Able to check pulse independently Yes       Intervention Provide education and demonstration on how to check pulse in carotid and radial arteries.;Review the importance of being able to check your own pulse for safety during independent exercise       Expected Outcomes Short Term: Able to explain why pulse checking is important during independent exercise;Long Term: Able to check pulse independently and accurately       Understanding of Exercise Prescription Yes       Intervention Provide education, explanation, and written materials on patient's individual exercise prescription       Expected Outcomes Short Term: Able to explain program exercise prescription;Long Term: Able to explain home exercise prescription to exercise independently          Exercise Goals Re-Evaluation :  Exercise Goals Re-Evaluation     Row Name 12/13/23 1036             Exercise Goal Re-Evaluation   Exercise Goals Review Able to understand and use rate of perceived exertion (RPE) scale;Knowledge and understanding of Target Heart Rate Range (THRR)       Comments Reviewed RPE and dyspnea scale, THR and program prescription with pt today.  Pt voiced understanding and was given a copy of goals to take home.       Expected Outcomes Short: Use RPE daily to  regulate intensity.  Long: Follow program prescription in THR.           Discharge Exercise Prescription (Final Exercise Prescription Changes):  Exercise Prescription Changes - 12/11/23 0900       Response to Exercise   Blood Pressure (Admit) 96/70    Blood Pressure (Exercise) 166/74    Blood Pressure (Exit) 132/72    Heart Rate (Admit) 62 bpm    Heart Rate (Exercise) 99 bpm    Heart Rate (Exit) 64 bpm    Oxygen Saturation (Admit) 97 %    Oxygen Saturation (Exercise) 96 %  Rating of Perceived Exertion (Exercise) 11    Symptoms chronic back pain 2/10    Comments walk test results          Nutrition:  Target Goals: Understanding of nutrition guidelines, daily intake of sodium 1500mg , cholesterol 200mg , calories 30% from fat and 7% or less from saturated fats, daily to have 5 or more servings of fruits and vegetables.  Biometrics:  Pre Biometrics - 12/11/23 0917       Pre Biometrics   Height 6' 1 (1.854 m)    Weight 216 lb 11.2 oz (98.3 kg)    Waist Circumference 40 inches    Hip Circumference 41.5 inches    Waist to Hip Ratio 0.96 %    BMI (Calculated) 28.6    Grip Strength 39.7 kg    Single Leg Stand 30 seconds           Nutrition Therapy Plan and Nutrition Goals:   Nutrition Assessments:  MEDIFICTS Score Key: >=70 Need to make dietary changes  40-70 Heart Healthy Diet <= 40 Therapeutic Level Cholesterol Diet  Flowsheet Row CARDIAC REHAB PHASE II ORIENTATION from 12/11/2023 in [facility] CARDIAC [facility]ILIT[facility]  Picture Your Plate Total Score on Admission 30   Picture Your Plate Scores: <59 Unhealthy dietary pattern with much room for improvement. 41-50 Dietary pattern unlikely to meet recommendations for good health and room for improvement. 51-60 More healthful dietary pattern, with some room for improvement.  >60 Healthy dietary pattern, although there may be some specific behaviors that could be improved.    Nutrition Goals  Re-Evaluation:   Nutrition Goals Discharge (Final Nutrition Goals Re-Evaluation):   Psychosocial: Target Goals: Acknowledge presence or absence of significant depression and/or stress, maximize coping skills, provide positive support system. Participant is able to verbalize types and ability to use techniques and skills needed for reducing stress and depression.  Initial Review & Psychosocial Screening:  Initial Psych Review & Screening - 12/08/23 1458       Initial Review   Current issues with None Identified      Family Dynamics   Good Support System? Yes    Comments Patient's wife and 2 children support him.      Barriers   Psychosocial barriers to participate in program The patient should benefit from training in stress management and relaxation.;There are no identifiable barriers or psychosocial needs.      Screening Interventions   Interventions To provide support and resources with identified psychosocial needs;Encouraged to exercise;Program counselor consult;Provide feedback about the scores to participant    Expected Outcomes Short Term goal: Utilizing psychosocial counselor, staff and physician to assist with identification of specific Stressors or current issues interfering with healing process. Setting desired goal for each stressor or current issue identified.;Long Term Goal: Stressors or current issues are controlled or eliminated.;Short Term goal: Identification and review with participant of any Quality of Life or Depression concerns found by scoring the questionnaire.;Long Term goal: The participant improves quality of Life and PHQ9 Scores as seen by post scores and/or verbalization of changes          Quality of Life Scores:  Quality of Life - 12/11/23 0917       Quality of Life   Select Quality of Life      Quality of Life Scores   Health/Function Pre 21.83 %    Socioeconomic Pre 25.71 %    Psych/Spiritual Pre 20.79 %    Family Pre 28.8 %    GLOBAL Pre  23.44 %         Scores of 19 and below usually indicate a poorer quality of life in these areas.  A difference of  2-3 points is a clinically meaningful difference.  A difference of 2-3 points in the total score of the Quality of Life Index has been associated with significant improvement in overall quality of life, self-image, physical symptoms, and general health in studies assessing change in quality of life.  PHQ-9: Review Flowsheet  More data may exist      12/11/2023 11/02/2016 07/21/2016 11/05/2015 06/16/2015  Depression screen PHQ 2/9  Decreased Interest 1 1 1 1 1   Down, Depressed, Hopeless 0 0 0 0 1  PHQ - 2 Score 1 1 1 1 2   Altered sleeping 0 1 0 0 0  Tired, decreased energy 1 2 2 1  0  Change in appetite 0 0 0 0 0  Feeling bad or failure about yourself  0 0 0 0 0  Trouble concentrating 0 0 0 0 0  Moving slowly or fidgety/restless 0 0 0 0 0  Suicidal thoughts 0 0 0  0  0   PHQ-9 Score 2 4 3 2 2   Difficult doing work/chores Not difficult at all Somewhat difficult Somewhat difficult Somewhat difficult Somewhat difficult    Details       Data saved with a previous flowsheet row definition        Interpretation of Total Score  Total Score Depression Severity:  1-4 = Minimal depression, 5-9 = Mild depression, 10-14 = Moderate depression, 15-19 = Moderately severe depression, 20-27 = Severe depression   Psychosocial Evaluation and Intervention:  Psychosocial Evaluation - 12/08/23 1458       Psychosocial Evaluation & Interventions   Interventions Encouraged to exercise with the program and follow exercise prescription;Relaxation education;Stress management education    Comments Patient was referred to cardiac rehab from the [facility] with chronic systolic HF. He has completed the program in 2018 here at [facility]. He denies any depression, anxiety or current stressors. He says he sleeps well. He has OSA but does not use CPAP. He says he had an ICD placed and developed an infection and has  not been the same since this event. He is currently smoking 10 cigars/week and has no interest in quitting at this time. He enjoys playing the guitar and spending time with his grandchildren. His goals for the program are to increase his energy; get stronger; and get back to doing his normal activities. He has no barriers identified to complete the program.    Expected Outcomes Short Term: Patient will start the program and attend consistently. Long Term: Patient will complete the program meeting personal goals.    Continue Psychosocial Services  Follow up required by staff          Psychosocial Re-Evaluation:   Psychosocial Discharge (Final Psychosocial Re-Evaluation):   Vocational Rehabilitation: Provide vocational rehab assistance to qualifying candidates.   Vocational Rehab Evaluation & Intervention:  Vocational Rehab - 12/08/23 1436       Initial Vocational Rehab Evaluation & Intervention   Assessment shows need for Vocational Rehabilitation No          Education: Education Goals: Education classes will be provided on a weekly basis, covering required topics. Participant will state understanding/return demonstration of topics presented.  Learning Barriers/Preferences:  Learning Barriers/Preferences - 12/08/23 1436       Learning Barriers/Preferences   Learning Barriers None    Learning Preferences Skilled Demonstration;Individual  Instruction;Group Instruction          Education Topics: Hypertension, Hypertension Reduction -Define heart disease and high blood pressure. Discus how high blood pressure affects the body and ways to reduce high blood pressure. Flowsheet Row CARDIAC REHAB PHASE II EXERCISE from 08/17/2016 in [facility]  Date 08/17/16  Educator Loa Sheets  Instruction Review Code (retired) 2- meets goals/outcomes    Exercise and Your Heart -Discuss why it is important to exercise, the FITT principles of exercise, normal and  abnormal responses to exercise, and how to exercise safely. Flowsheet Row CARDIAC REHAB PHASE II EXERCISE from 09/16/2015 in [place] [facility]  Date 08/26/15  Educator Loa Sheets  Instruction Review Code (retired) 2- meets goals/outcomes    Angina -Discuss definition of angina, causes of angina, treatment of angina, and how to decrease risk of having angina. Flowsheet Row CARDIAC REHAB PHASE II EXERCISE from 09/16/2015 in [place] [facility]  Date 09/02/15  Educator DCoad  Instruction Review Code 2- meets goals/outcomes    Cardiac Medications -Review what the following cardiac medications are used for, how they affect the body, and side effects that may occur when taking the medications.  Medications include Aspirin , Beta blockers, calcium  channel blockers, ACE Inhibitors, angiotensin receptor blockers, diuretics, digoxin, and antihyperlipidemics.   Congestive Heart Failure -Discuss the definition of CHF, how to live with CHF, the signs and symptoms of CHF, and how keep track of weight and sodium intake. Flowsheet Row CARDIAC REHAB PHASE II EXERCISE from 09/16/2015 in [place] [facility]  Date 09/16/15  Educator DC  Instruction Review Code 2- meets goals/outcomes    Heart Disease and Intimacy -Discus the effect sexual activity has on the heart, how changes occur during intimacy as we age, and safety during sexual activity. Flowsheet Row CARDIAC REHAB PHASE II EXERCISE from 09/16/2015 in [place] [facility]  Date 06/24/15  Educator Loa Sheets  Instruction Review Code (retired) 2- meets goals/outcomes    Smoking Cessation / COPD -Discuss different methods to quit smoking, the health benefits of quitting smoking, and the definition of COPD. Flowsheet Row CARDIAC REHAB PHASE II EXERCISE from 09/16/2015 in [place] [facility]  Date 07/01/15  Educator Loa Sheets  Instruction Review Code (retired) 2- meets  goals/outcomes    Nutrition I: Fats -Discuss the types of cholesterol, what cholesterol does to the heart, and how cholesterol levels can be controlled. Flowsheet Row CARDIAC REHAB PHASE II EXERCISE from 09/16/2015 in [place] [facility]  Date 07/08/15  Educator Loa Sheets  Instruction Review Code 2- meets goals/outcomes    Nutrition II: Labels -Discuss the different components of food labels and how to read food label Flowsheet Row CARDIAC REHAB PHASE II EXERCISE from 09/16/2015 in [place] [facility]  Date 07/15/15  Educator Loa Sheets  Instruction Review Code 2- meets goals/outcomes    Heart Parts/Heart Disease and PAD -Discuss the anatomy of the heart, the pathway of blood circulation through the heart, and these are affected by heart disease. Flowsheet Row CARDIAC REHAB PHASE II EXERCISE from 09/16/2015 in [place] [facility]  Date 07/22/15  Educator Loa Sheets  Instruction Review Code 2- meets goals/outcomes    Stress I: Signs and Symptoms -Discuss the causes of stress, how stress may lead to anxiety and depression, and ways to limit stress. Flowsheet Row CARDIAC REHAB PHASE II EXERCISE from 09/16/2015 in [place] [facility]  Date 07/29/15  Educator Loa Sheets  Instruction Review Code 2-  meets goals/outcomes    Stress II: Relaxation -Discuss different types of relaxation techniques to limit stress. Flowsheet Row CARDIAC REHAB PHASE II EXERCISE from 08/17/2016 in [facility]  Date 08/17/16    Warning Signs of Stroke / TIA -Discuss definition of a stroke, what the signs and symptoms are of a stroke, and how to identify when someone is having stroke. Flowsheet Row CARDIAC REHAB PHASE II EXERCISE from 09/16/2015 in [place] [facility]  Date 08/12/15  Educator Loa Sheets  Instruction Review Code 2- meets goals/outcomes    Knowledge Questionnaire Score:  Knowledge Questionnaire Score  - 12/13/23 1151       Knowledge Questionnaire Score   Pre Score 20/26          Core Components/Risk Factors/Patient Goals at Admission:  Personal Goals and Risk Factors at Admission - 12/11/23 0918       Core Components/Risk Factors/Patient Goals on Admission    Weight Management Yes;Weight Loss    Intervention Weight Management: Develop a combined nutrition and exercise program designed to reach desired caloric intake, while maintaining appropriate intake of nutrient and fiber, sodium and fats, and appropriate energy expenditure required for the weight goal.;Weight Management: Provide education and appropriate resources to help participant work on and attain dietary goals.;Weight Management/Obesity: Establish reasonable short term and long term weight goals.    Admit Weight 216 lb 11.2 oz (98.3 kg)    Goal Weight: Short Term 211 lb (95.7 kg)    Goal Weight: Long Term 200 lb (90.7 kg)    Expected Outcomes Short Term: Continue to assess and modify interventions until short term weight is achieved;Long Term: Adherence to nutrition and physical activity/exercise program aimed toward attainment of established weight goal;Weight Loss: Understanding of general recommendations for a balanced deficit meal plan, which promotes 1-2 lb weight loss per week and includes a negative energy balance of [phone] kcal/d;Understanding recommendations for meals to include 15-35% energy as protein, 25-35% energy from fat, 35-60% energy from carbohydrates, less than 200mg  of dietary cholesterol, 20-35 gm of total fiber daily;Understanding of distribution of calorie intake throughout the day with the consumption of 4-5 meals/snacks    Tobacco Cessation Yes    Intervention Assist the participant in steps to quit. Provide individualized education and counseling about committing to Tobacco Cessation, relapse prevention, and pharmacological support that can be provided by physician.;Education officer, environmental, assist  with locating and accessing local/national Quit Smoking programs, and support quit date choice.    Expected Outcomes Short Term: Will demonstrate readiness to quit, by selecting a quit date.;Long Term: Complete abstinence from all tobacco products for at least 12 months from quit date.;Short Term: Will quit all tobacco product use, adhering to prevention of relapse plan.    Heart Failure Yes    Intervention Provide a combined exercise and nutrition program that is supplemented with education, support and counseling about heart failure. Directed toward relieving symptoms such as shortness of breath, decreased exercise tolerance, and extremity edema.    Expected Outcomes Improve functional capacity of life;Short term: Attendance in program 2-3 days a week with increased exercise capacity. Reported lower sodium intake. Reported increased fruit and vegetable intake. Reports medication compliance.;Short term: Daily weights obtained and reported for increase. Utilizing diuretic protocols set by physician.;Long term: Adoption of self-care skills and reduction of barriers for early signs and symptoms recognition and intervention leading to self-care maintenance.    Hypertension Yes    Intervention Provide education on lifestyle modifcations including regular physical activity/exercise,  weight management, moderate sodium restriction and increased consumption of fresh fruit, vegetables, and low fat dairy, alcohol moderation, and smoking cessation.;Monitor prescription use compliance.    Expected Outcomes Short Term: Continued assessment and intervention until BP is < 140/27mm HG in hypertensive participants. < 130/33mm HG in hypertensive participants with diabetes, heart failure or chronic kidney disease.;Long Term: Maintenance of blood pressure at goal levels.    Lipids Yes    Intervention Provide education and support for participant on nutrition & aerobic/resistive exercise along with prescribed medications to  achieve LDL 70mg , HDL >40mg .    Expected Outcomes Short Term: Participant states understanding of desired cholesterol values and is compliant with medications prescribed. Participant is following exercise prescription and nutrition guidelines.;Long Term: Cholesterol controlled with medications as prescribed, with individualized exercise RX and with personalized nutrition plan. Value goals: LDL < 70mg , HDL > 40 mg.          Core Components/Risk Factors/Patient Goals Review:    Core Components/Risk Factors/Patient Goals at Discharge (Final Review):    ITP Comments:  ITP Comments     Row Name 12/08/23 1506 12/11/23 0923 12/13/23 1036 12/27/23 1427     ITP Comments Virtual orientation visit completed for cardiac rehab with chronic systolic HF. On-site orientation visit scheduled for 12/11/23 at 0800. Patient arrived for 1st visit/orientation/education at 0800. Patient was referred to CR by Dr. Pamella Blanch at the [facility] due to Chronic systolic HF. During orientation advised patient on arrival and appointment times what to wear, what to do before, during and after exercise. Reviewed attendance and class policy.  Pt is scheduled to return Cardiac Rehab on 12/13/23 at 915. Pt was advised to come to class 15 minutes before class starts.  Discussed RPE/Dpysnea scales. Patient participated in warm up stretches. Patient was able to complete 6 minute walk test.  Telemetry:NSR. Patient was measured for the equipment. Discussed equipment safety with patient. Took patient pre-anthropometric measurements. Patient finished visit at 915. First full day of exercise!  Patient was oriented to gym and equipment including functions, settings, policies, and procedures.  Patient's individual exercise prescription and treatment plan were reviewed.  All starting workloads were established based on the results of the 6 minute walk test done at initial orientation visit.  The plan for exercise progression was also introduced and  progression will be customized based on patient's performance and goals. 30 day review completed. ITP sent to Dr. Dorn Ross, Medical Director of Cardiac Rehab. Continue with ITP unless changes are made by physician. Still new to program       Comments: 30 day review

## 2023-12-27 NOTE — Progress Notes (Signed)
 Daily Session Note  Patient Details  Name: Stephen Macdonald MRN: [id number] Date of Birth: 1945-06-16 Referring Provider:   Flowsheet Row CARDIAC REHAB PHASE II ORIENTATION from 12/11/2023 in [facility] CARDIAC REHABILITATION  Referring Provider Tobie Pamella Guadal[place]ara MD    Encounter Date: 12/27/2023  Check In:  Session Check In - 12/27/23 0914       Check-In   Supervising physician immediately available to respond to emergencies See telemetry face sheet for immediately available MD    Location [facility]    Staff Present Laymon Rattler, BSN, RN, WTA-C;Heather Con, BS, Exercise Physiologist    Virtual Visit No    Medication changes reported     No    Fall or balance concerns reported    No    Tobacco Cessation No Change    Warm-up and Cool-down Performed on first and last piece of equipment    Resistance Training Performed Yes    VAD Patient? No    PAD/SET Patient? No      Pain Assessment   Currently in Pain? No/denies          Capillary Blood Glucose: No results found for this or any previous visit (from the past 24 hours).    Social History   Tobacco Use  Smoking Status Some Days   Types: Cigars, Cigarettes  Smokeless Tobacco Former   Types: Snuff  Tobacco Comments   Discussed cessation and shared we have classes that he could attend.     Goals Met:  Independence with exercise equipment Exercise tolerated well No report of concerns or symptoms today Strength training completed today  Goals Unmet:  Not Applicable  Comments: Pt able to follow exercise prescription today without complaint.  Will continue to monitor for progression.

## 2023-12-29 ENCOUNTER — Encounter (HOSPITAL_COMMUNITY)

## 2024-01-01 ENCOUNTER — Encounter (HOSPITAL_COMMUNITY)
Admission: RE | Admit: 2024-01-01 | Discharge: 2024-01-01 | Disposition: A | Discharge status: Home / Self Care | Source: Ambulatory Visit | Attending: Internal Medicine

## 2024-01-01 DIAGNOSIS — I5022 Chronic systolic (congestive) heart failure: Secondary | ICD-10-CM

## 2024-01-01 NOTE — Progress Notes (Signed)
 Daily Session Note  Patient Details  Name: Stephen Macdonald MRN: [id number] Date of Birth: [birth year] Referring Provider:   Flowsheet Row CARDIAC REHAB PHASE II ORIENTATION from 12/11/2023 in [facility] CARDIAC REHABILITATION  Referring Provider Tobie Pamella [place]ara MD    Encounter Date: 01/01/2024  Check In:  Session Check In - 01/01/24 0915       Check-In   Supervising physician immediately available to respond to emergencies See telemetry face sheet for immediately available MD    Location [facility]    Staff Present Powell Benders, BS, Exercise Physiologist;Brittany Jackquline, BSN, RN, Rosalba Gelineau, MA, RCEP, CCRP, CCET    Virtual Visit No    Medication changes reported     No    Fall or balance concerns reported    No    Tobacco Cessation No Change    Warm-up and Cool-down Performed on first and last piece of equipment    Resistance Training Performed Yes    VAD Patient? No    PAD/SET Patient? No      Pain Assessment   Currently in Pain? No/denies    Multiple Pain Sites No          Capillary Blood Glucose: No results found for this or any previous visit (from the past 24 hours).    Social History   Tobacco Use  Smoking Status Some Days   Types: Cigars, Cigarettes  Smokeless Tobacco Former   Types: Snuff  Tobacco Comments   Discussed cessation and shared we have classes that he could attend.     Goals Met:  Independence with exercise equipment Exercise tolerated well No report of concerns or symptoms today Strength training completed today  Goals Unmet:  Not Applicable  Comments: Pt able to follow exercise prescription today without complaint.  Will continue to monitor for progression.

## 2024-01-03 ENCOUNTER — Encounter (HOSPITAL_COMMUNITY)
Admission: RE | Admit: 2024-01-03 | Discharge: 2024-01-03 | Disposition: A | Discharge status: Home / Self Care | Source: Ambulatory Visit | Attending: Internal Medicine | Admitting: Internal Medicine

## 2024-01-03 DIAGNOSIS — I5022 Chronic systolic (congestive) heart failure: Secondary | ICD-10-CM | POA: Diagnosis not present

## 2024-01-03 NOTE — Progress Notes (Signed)
 Daily Session Note  Patient Details  Name: Stephen Macdonald MRN: [id number] Date of Birth: [birth year] Referring Provider:   Flowsheet Row CARDIAC REHAB PHASE II ORIENTATION from 12/11/2023 in [facility] CARDIAC REHABILITATION  Referring Provider Stephen Pamella [place] MD    Encounter Date: 01/03/2024  Check In:  Session Check In - 01/03/24 0933       Check-In   Supervising physician immediately available to respond to emergencies See telemetry face sheet for immediately available MD    Location [facility]    Staff Present Stephen Macdonald, BSN, RN, WTA-C;Stephen Macdonald, BS, Exercise Physiologist    Virtual Visit No    Medication changes reported     No    Fall or balance concerns reported    No    Tobacco Cessation No Change    Warm-up and Cool-down Performed on first and last piece of equipment    Resistance Training Performed Yes    VAD Patient? No    PAD/SET Patient? No      Pain Assessment   Currently in Pain? No/denies          Capillary Blood Glucose: No results found for this or any previous visit (from the past 24 hours).    Social History   Tobacco Use  Smoking Status Some Days   Types: Cigars, Cigarettes  Smokeless Tobacco Former   Types: Snuff  Tobacco Comments   Discussed cessation and shared we have classes that he could attend.     Goals Met:  Independence with exercise equipment Exercise tolerated well No report of concerns or symptoms today Strength training completed today  Goals Unmet:  Not Applicable  Comments: Pt able to follow exercise prescription today without complaint.  Will continue to monitor for progression.

## 2024-01-05 ENCOUNTER — Encounter (HOSPITAL_COMMUNITY)
Admission: RE | Admit: 2024-01-05 | Discharge: 2024-01-05 | Disposition: A | Discharge status: Home / Self Care | Source: Ambulatory Visit | Attending: Internal Medicine | Admitting: Internal Medicine

## 2024-01-05 DIAGNOSIS — I5022 Chronic systolic (congestive) heart failure: Secondary | ICD-10-CM

## 2024-01-05 NOTE — Progress Notes (Signed)
 Daily Session Note  Patient Details  Name: Stephen Macdonald MRN: [id number] Date of Birth: 13-Dec-1945 Referring Provider:   Flowsheet Row CARDIAC REHAB PHASE II ORIENTATION from 12/11/2023 in [facility] CARDIAC REHABILITATION  Referring Provider Tobie Pamella [place]ara MD    Encounter Date: 01/05/2024  Check In:  Session Check In - 01/05/24 0921       Check-In   Supervising physician immediately available to respond to emergencies See telemetry face sheet for immediately available MD    Location [facility]    Staff Present Powell Benders, BS, Exercise Physiologist;Mykia Holton Zina, RN    Virtual Visit No    Medication changes reported     No    Fall or balance concerns reported    No    Warm-up and Cool-down Performed on first and last piece of equipment    Resistance Training Performed Yes    VAD Patient? No    PAD/SET Patient? No      Pain Assessment   Currently in Pain? No/denies          Capillary Blood Glucose: No results found for this or any previous visit (from the past 24 hours).    Social History   Tobacco Use  Smoking Status Some Days   Types: Cigars, Cigarettes  Smokeless Tobacco Former   Types: Snuff  Tobacco Comments   Discussed cessation and shared we have classes that he could attend.     Goals Met:  Independence with exercise equipment Exercise tolerated well No report of concerns or symptoms today Strength training completed today  Goals Unmet:  Not Applicable  Comments: Pt able to follow exercise prescription today without complaint.  Will continue to monitor for progression.

## 2024-01-08 ENCOUNTER — Encounter (HOSPITAL_COMMUNITY)
Admission: RE | Admit: 2024-01-08 | Discharge: 2024-01-08 | Disposition: A | Discharge status: Home / Self Care | Source: Ambulatory Visit | Attending: Internal Medicine | Admitting: Internal Medicine

## 2024-01-08 DIAGNOSIS — I5022 Chronic systolic (congestive) heart failure: Secondary | ICD-10-CM | POA: Diagnosis not present

## 2024-01-08 NOTE — Progress Notes (Signed)
 Daily Session Note  Patient Details  Name: Stephen Macdonald MRN: [id number] Date of Birth: 1945-12-23 Referring Provider:   Flowsheet Row CARDIAC REHAB PHASE II ORIENTATION from 12/11/2023 in [facility] CARDIAC REHABILITATION  Referring Provider Tobie Pamella [place] MD    Encounter Date: 01/08/2024  Check In:  Session Check In - 01/08/24 0903       Check-In   Supervising physician immediately available to respond to emergencies See telemetry face sheet for immediately available MD    Location [facility]    Staff Present Powell Benders, BS, Exercise Physiologist;Korey Prashad Jackquline, BSN, RN, WTA-C    Virtual Visit No    Medication changes reported     No    Fall or balance concerns reported    No    Tobacco Cessation No Change    Warm-up and Cool-down Performed on first and last piece of equipment    Resistance Training Performed Yes    VAD Patient? No    PAD/SET Patient? No      Pain Assessment   Currently in Pain? No/denies          Capillary Blood Glucose: No results found for this or any previous visit (from the past 24 hours).    Social History   Tobacco Use  Smoking Status Some Days   Types: Cigars, Cigarettes  Smokeless Tobacco Former   Types: Snuff  Tobacco Comments   Discussed cessation and shared we have classes that he could attend.     Goals Met:  Independence with exercise equipment Exercise tolerated well No report of concerns or symptoms today Strength training completed today  Goals Unmet:  Not Applicable  Comments: Pt able to follow exercise prescription today without complaint.  Will continue to monitor for progression.

## 2024-01-10 ENCOUNTER — Encounter (HOSPITAL_COMMUNITY)
Admission: RE | Admit: 2024-01-10 | Discharge: 2024-01-10 | Disposition: A | Discharge status: Home / Self Care | Source: Ambulatory Visit | Attending: Internal Medicine

## 2024-01-10 DIAGNOSIS — I5022 Chronic systolic (congestive) heart failure: Secondary | ICD-10-CM | POA: Diagnosis not present

## 2024-01-10 NOTE — Progress Notes (Signed)
 Daily Session Note  Patient Details  Name: Stephen Macdonald MRN: [id number] Date of Birth: 14-Nov-1945 Referring Provider:   Flowsheet Row CARDIAC REHAB PHASE II ORIENTATION from 12/11/2023 in [facility] CARDIAC REHABILITATION  Referring Provider Tobie Pamella [place] MD    Encounter Date: 01/10/2024  Check In:  Session Check In - 01/10/24 0910       Check-In   Supervising physician immediately available to respond to emergencies See telemetry face sheet for immediately available MD    Location [facility]    Staff Present Harlene Gelineau, MA, RCEP, CCRP, CCET;Adonis Yim Jackquline, BSN, RN, WTA-C    Virtual Visit No    Medication changes reported     No    Fall or balance concerns reported    No    Tobacco Cessation No Change    Warm-up and Cool-down Performed on first and last piece of equipment    Resistance Training Performed Yes    VAD Patient? No    PAD/SET Patient? No      Pain Assessment   Currently in Pain? No/denies          Capillary Blood Glucose: No results found for this or any previous visit (from the past 24 hours).    Social History   Tobacco Use  Smoking Status Some Days   Types: Cigars, Cigarettes  Smokeless Tobacco Former   Types: Snuff  Tobacco Comments   Discussed cessation and shared we have classes that he could attend.     Goals Met:  Independence with exercise equipment Exercise tolerated well No report of concerns or symptoms today Strength training completed today  Goals Unmet:  Not Applicable  Comments: Pt able to follow exercise prescription today without complaint.  Will continue to monitor for progression.

## 2024-01-12 ENCOUNTER — Encounter (HOSPITAL_COMMUNITY)
Admission: RE | Admit: 2024-01-12 | Discharge: 2024-01-12 | Disposition: A | Discharge status: Home / Self Care | Source: Ambulatory Visit | Attending: Internal Medicine | Admitting: Internal Medicine

## 2024-01-12 DIAGNOSIS — I5022 Chronic systolic (congestive) heart failure: Secondary | ICD-10-CM

## 2024-01-12 NOTE — Progress Notes (Signed)
 Daily Session Note  Patient Details  Name: Stephen Macdonald MRN: [id number] Date of Birth: 01/26/1946 Referring Provider:   Flowsheet Row CARDIAC REHAB PHASE II ORIENTATION from 12/11/2023 in [facility] CARDIAC REHABILITATION  Referring Provider Stephen Pamella [place] MD    Encounter Date: 01/12/2024  Check In:  Session Check In - 01/12/24 0924       Check-In   Supervising physician immediately available to respond to emergencies See telemetry face sheet for immediately available MD    Location [facility]    Staff Present Powell Benders, BS, Exercise Physiologist;Victoria Zina, RN;Tiaja Hagan Vonzell, MA, RCEP, CCRP, CCET    Virtual Visit No    Medication changes reported     No    Fall or balance concerns reported    No    Warm-up and Cool-down Performed on first and last piece of equipment    Resistance Training Performed Yes    VAD Patient? No    PAD/SET Patient? No      Pain Assessment   Currently in Pain? No/denies          Capillary Blood Glucose: No results found for this or any previous visit (from the past 24 hours).    Social History   Tobacco Use  Smoking Status Some Days   Types: Cigars, Cigarettes  Smokeless Tobacco Former   Types: Snuff  Tobacco Comments   Discussed cessation and shared we have classes that he could attend.     Goals Met:  Independence with exercise equipment Exercise tolerated well No report of concerns or symptoms today Strength training completed today  Goals Unmet:  Not Applicable  Comments: Pt able to follow exercise prescription today without complaint.  Will continue to monitor for progression.

## 2024-01-15 ENCOUNTER — Encounter (HOSPITAL_COMMUNITY)

## 2024-01-17 ENCOUNTER — Encounter (HOSPITAL_COMMUNITY)
Admission: RE | Admit: 2024-01-17 | Discharge: 2024-01-17 | Disposition: A | Discharge status: Home / Self Care | Source: Ambulatory Visit | Attending: Internal Medicine | Admitting: Internal Medicine

## 2024-01-17 DIAGNOSIS — I5022 Chronic systolic (congestive) heart failure: Secondary | ICD-10-CM | POA: Diagnosis present

## 2024-01-17 NOTE — Progress Notes (Signed)
 Daily Session Note  Patient Details  Name: Stephen Macdonald MRN: [id number] Date of Birth: [birth year] Referring Provider:   Flowsheet Row CARDIAC REHAB PHASE II ORIENTATION from 12/11/2023 in [facility] CARDIAC REHABILITATION  Referring Provider Tobie Pamella Gu[place]ara MD    Encounter Date: 01/17/2024  Check In:  Session Check In - 01/17/24 9061       Check-In   Supervising physician immediately available to respond to emergencies See telemetry face sheet for immediately available MD    Location [facility]    Staff Present Powell Benders, BS, Exercise Physiologist;Jamilynn Whitacre Vonzell, MA, RCEP, CCRP, CCET;Hillary Troutman BSN, RN    Virtual Visit No    Medication changes reported     No    Fall or balance concerns reported    No    Warm-up and Cool-down Performed on first and last piece of equipment    Resistance Training Performed Yes    VAD Patient? No    PAD/SET Patient? No      Pain Assessment   Currently in Pain? No/denies          Capillary Blood Glucose: No results found for this or any previous visit (from the past 24 hours).    Social History   Tobacco Use  Smoking Status Some Days   Types: Cigars, Cigarettes  Smokeless Tobacco Former   Types: Snuff  Tobacco Comments   Discussed cessation and shared we have classes that he could attend.     Goals Met:  Independence with exercise equipment Exercise tolerated well No report of concerns or symptoms today Strength training completed today  Goals Unmet:  Not Applicable  Comments: Pt able to follow exercise prescription today without complaint.  Will continue to monitor for progression.

## 2024-01-19 ENCOUNTER — Encounter (HOSPITAL_COMMUNITY)
Admission: RE | Admit: 2024-01-19 | Discharge: 2024-01-19 | Disposition: A | Discharge status: Home / Self Care | Source: Ambulatory Visit | Attending: Internal Medicine | Admitting: Internal Medicine

## 2024-01-19 DIAGNOSIS — I5022 Chronic systolic (congestive) heart failure: Secondary | ICD-10-CM

## 2024-01-19 NOTE — Progress Notes (Signed)
 Daily Session Note  Patient Details  Name: Stephen Macdonald MRN: [id number] Date of Birth: 12-24-1945 Referring Provider:   Flowsheet Row CARDIAC REHAB PHASE II ORIENTATION from 12/11/2023 in [facility] CARDIAC REHABILITATION  Referring Provider Tobie Pamella [place] MD    Encounter Date: 01/19/2024  Check In:  Session Check In - 01/19/24 0925       Check-In   Supervising physician immediately available to respond to emergencies See telemetry face sheet for immediately available MD    Location [facility]    Staff Present Powell Benders, BS, Exercise Physiologist;Chanita Boden Vonzell, MA, RCEP, CCRP, CCET;Rachel Holcomb, RN, BSN    Virtual Visit No    Medication changes reported     No    Fall or balance concerns reported    No    Warm-up and Cool-down Performed on first and last piece of equipment    Resistance Training Performed Yes    VAD Patient? No    PAD/SET Patient? No      Pain Assessment   Currently in Pain? No/denies          Capillary Blood Glucose: No results found for this or any previous visit (from the past 24 hours).    Social History   Tobacco Use  Smoking Status Some Days   Types: Cigars, Cigarettes  Smokeless Tobacco Former   Types: Snuff  Tobacco Comments   Discussed cessation and shared we have classes that he could attend.     Goals Met:  Independence with exercise equipment Exercise tolerated well No report of concerns or symptoms today Strength training completed today  Goals Unmet:  Not Applicable  Comments: Pt able to follow exercise prescription today without complaint.  Will continue to monitor for progression.

## 2024-01-22 ENCOUNTER — Encounter (HOSPITAL_COMMUNITY)
Admission: RE | Admit: 2024-01-22 | Discharge: 2024-01-22 | Disposition: A | Discharge status: Home / Self Care | Source: Ambulatory Visit | Attending: Internal Medicine | Admitting: Internal Medicine

## 2024-01-22 DIAGNOSIS — I5022 Chronic systolic (congestive) heart failure: Secondary | ICD-10-CM | POA: Diagnosis not present

## 2024-01-22 NOTE — Progress Notes (Signed)
 Daily Session Note  Patient Details  Name: Stephen Macdonald MRN: [id number] Date of Birth: [birth year] Referring Provider:   Flowsheet Row CARDIAC REHAB PHASE II ORIENTATION from 12/11/2023 in [facility] CARDIAC REHABILITATION  Referring Provider Tobie Pamella [place] MD    Encounter Date: 01/22/2024  Check In:  Session Check In - 01/22/24 9072       Check-In   Supervising physician immediately available to respond to emergencies See telemetry face sheet for immediately available MD    Location [facility]    Staff Present Laymon Rattler, BSN, RN, Rosalba Gelineau, MA, RCEP, CCRP, CCET;Victoria Juliaetta, RN    Virtual Visit No    Medication changes reported     No    Fall or balance concerns reported    No    Tobacco Cessation No Change    Warm-up and Cool-down Performed on first and last piece of equipment    Resistance Training Performed Yes    VAD Patient? No    PAD/SET Patient? No      Pain Assessment   Currently in Pain? No/denies          Capillary Blood Glucose: No results found for this or any previous visit (from the past 24 hours).    Social History   Tobacco Use  Smoking Status Some Days   Types: Cigars, Cigarettes  Smokeless Tobacco Former   Types: Snuff  Tobacco Comments   Discussed cessation and shared we have classes that he could attend.     Goals Met:  Independence with exercise equipment Exercise tolerated well No report of concerns or symptoms today Strength training completed today  Goals Unmet:  Not Applicable  Comments: Pt able to follow exercise prescription today without complaint.  Will continue to monitor for progression.

## 2024-01-24 ENCOUNTER — Encounter (HOSPITAL_COMMUNITY)
Admission: RE | Admit: 2024-01-24 | Discharge: 2024-01-24 | Disposition: A | Discharge status: Home / Self Care | Source: Ambulatory Visit | Attending: Internal Medicine | Admitting: Internal Medicine

## 2024-01-24 ENCOUNTER — Encounter (HOSPITAL_COMMUNITY): Payer: Self-pay | Admitting: *Deleted

## 2024-01-24 DIAGNOSIS — I5022 Chronic systolic (congestive) heart failure: Secondary | ICD-10-CM

## 2024-01-24 NOTE — Progress Notes (Signed)
 Daily Session Note  Patient Details  Name: EAMONN SERMENO MRN: [id number] Date of Birth: 1945/07/21 Referring Provider:   Flowsheet Row CARDIAC REHAB PHASE II ORIENTATION from 12/11/2023 in [facility] CARDIAC REHABILITATION  Referring Provider Tobie Pamella [place] MD    Encounter Date: 01/24/2024  Check In:  Session Check In - 01/24/24 0913       Check-In   Supervising physician immediately available to respond to emergencies See telemetry face sheet for immediately available MD    Location [facility]    Staff Present Laymon Rattler, BSN, RN, Rosalba Gelineau, MA, RCEP, CCRP, CCET;Heather [place], [place], Exercise Physiologist    Virtual Visit No    Medication changes reported     No    Fall or balance concerns reported    No    Tobacco Cessation No Change    Warm-up and Cool-down Performed on first and last piece of equipment    Resistance Training Performed Yes    VAD Patient? No    PAD/SET Patient? No      Pain Assessment   Currently in Pain? No/denies          Capillary Blood Glucose: No results found for this or any previous visit (from the past 24 hours).    Social History   Tobacco Use  Smoking Status Some Days   Types: Cigars, Cigarettes  Smokeless Tobacco Former   Types: Snuff  Tobacco Comments   Discussed cessation and shared we have classes that he could attend.     Goals Met:  Independence with exercise equipment Exercise tolerated well No report of concerns or symptoms today Strength training completed today  Goals Unmet:  Not Applicable  Comments: Pt able to follow exercise prescription today without complaint.  Will continue to monitor for progression.

## 2024-01-24 NOTE — Progress Notes (Signed)
 Cardiac Individual Treatment Plan  Patient Details  Name: Stephen Macdonald MRN: [id number] Date of Birth: 14-Dec-1945 Referring Provider:   Flowsheet Row CARDIAC REHAB PHASE II ORIENTATION from 12/11/2023 in [facility] CARDIAC REHABILITATION  Referring Provider Tobie Pamella Guadalajara MD    Initial Encounter Date:  Flowsheet Row CARDIAC REHAB PHASE II ORIENTATION from 12/11/2023 in [facility] [place] [facility]  Date 12/11/23    Visit Diagnosis: Heart failure, chronic systolic (HCC)  Patient's Home Medications on Admission:  Current Outpatient Medications:    acetaminophen  (TYLENOL ) 500 MG tablet, Take 2 tablets (1,000 mg total) by mouth every 6 (six) hours as needed for mild pain., Disp: , Rfl:    apixaban (ELIQUIS) 5 MG TABS tablet, Take 5 mg by mouth 2 (two) times daily., Disp: , Rfl:    atorvastatin  (LIPITOR ) 80 MG tablet, Take 1 tablet (80 mg total) by mouth daily., Disp: 30 tablet, Rfl: 11   benzonatate  (TESSALON ) 100 MG capsule, Take 1 capsule (100 mg total) by mouth every 8 (eight) hours. (Patient not taking: Reported on 12/11/2023), Disp: 30 capsule, Rfl: 0   cholecalciferol  (VITAMIN D ) 1000 UNITS tablet, Take 1,000 Units by mouth at bedtime. , Disp: , Rfl:    clopidogrel  (PLAVIX ) 75 MG tablet, Take 1 tablet (75 mg total) by mouth daily. Start on Sunday 11/11/18 (Patient not taking: Reported on 12/11/2023), Disp: 30 tablet, Rfl: 2   empagliflozin (JARDIANCE) 25 MG TABS tablet, Take 25 mg by mouth daily., Disp: , Rfl:    ezetimibe  (ZETIA ) 10 MG tablet, Take 10 mg by mouth daily., Disp: , Rfl:    finasteride (PROSCAR) 5 MG tablet, Take 5 mg by mouth daily., Disp: , Rfl:    furosemide  (LASIX ) 40 MG tablet, Take 0.5 tablets (20 mg total) by mouth daily. Start after BMP test recheck on Monday 11/12/18 (Patient not taking: Reported on 12/11/2023), Disp: 30 tablet, Rfl: 1   metoprolol  succinate (TOPROL -XL) 25 MG 24 hr tablet, Take 25 mg by mouth every evening. , Disp: , Rfl:    nitroGLYCERIN   (NITROSTAT ) 0.4 MG SL tablet, Place 1 tablet (0.4 mg total) under the tongue every 5 (five) minutes as needed for chest pain., Disp: 25 tablet, Rfl: 3   ondansetron  (ZOFRAN ) 4 MG tablet, Take 1 tablet (4 mg total) by mouth every 6 (six) hours as needed for nausea., Disp: 14 tablet, Rfl: 0   oxyCODONE  (OXY IR/ROXICODONE ) 5 MG immediate release tablet, Take 1 tablet (5 mg total) by mouth every 6 (six) hours as needed for severe pain. (Patient not taking: Reported on 12/11/2023), Disp: 15 tablet, Rfl: 0   spironolactone (ALDACTONE) 25 MG tablet, Take 25 mg by mouth 2 (two) times daily. (Patient not taking: Reported on 12/11/2023), Disp: , Rfl:    tamsulosin  (FLOMAX ) 0.4 MG CAPS capsule, Take 0.4 mg by mouth every morning. (Patient not taking: Reported on 12/11/2023), Disp: , Rfl:    valsartan  (DIOVAN ) 40 MG tablet, Take 1 tablet (40 mg total) by mouth at bedtime. Start after BMP test recheck on Monday 11/12/18 (Patient taking differently: Take 20 mg by mouth at bedtime.), Disp: 30 tablet, Rfl: 1  Past Medical History: Past Medical History:  Diagnosis Date   Chronic combined systolic and diastolic CHF, NYHA class 3 (HCC)    a. 01/2015 Echo: EF 35-30%; b. 03/2016 Echo: Ef 20%, Gr1DD, mid-apical anterior, mid anteroseptal, apical inferior, basal inferolateral, mid anterolateral, and apical AK, basal anteroseptal and mid inferior severe HK, midlly dil LA, mild TR.  CKD (chronic kidney disease), stage III (HCC) 04/15/2016   Coronary artery disease    a. 1997 s/p CABG;  b. 01/2015 MI/PCI: G->PDA;  c. 03/2016 NSTEMI/PCI: LM 20, LAD 114m, D2 80, RI 60, LCX ok, OM1 90, RCA 60p, 167m, RPAV 80, G->RPDA 15p ISR, 90d (3.5x28 Promus Premier DES), LIMA->OM1->dLAD nl.   High cholesterol    Hypertension    Ischemic cardiomyopathy    a. 01/2015 Echo: EF 25-30%; b. s/p AICD;  c. 03/2016 Echo: Ef 20%, Gr1DD.   Smoker     Tobacco Use: Social History   Tobacco Use  Smoking Status Some Days   Types: Cigars, Cigarettes   Smokeless Tobacco Former   Types: Snuff  Tobacco Comments   Discussed cessation and shared we have classes that he could attend.     Labs: Review Flowsheet       Latest Ref Rng & Units 02/12/2015 04/16/2016  Labs for ITP Cardiac and Pulmonary Rehab  Cholestrol 0 - 200 mg/dL - 842   LDL (calc) 0 - 99 mg/dL - 95   HDL-C >59 mg/dL - 43   Trlycerides <849 mg/dL - 96   Hemoglobin J8r 4.8 - 5.6 % - 5.6   TCO2 0 - 100 mmol/L 18  -    Capillary Blood Glucose: No results found for: GLUCAP   Exercise Target Goals: Exercise Program Goal: Individual exercise prescription set using results from initial 6 min walk test and THRR while considering  patient's activity barriers and safety.   Exercise Prescription Goal: Starting with aerobic activity 30 plus minutes a day, 3 days per week for initial exercise prescription. Provide home exercise prescription and guidelines that participant acknowledges understanding prior to discharge.  Activity Barriers & Risk Stratification:  Activity Barriers & Cardiac Risk Stratification - 12/08/23 1448       Activity Barriers & Cardiac Risk Stratification   Activity Barriers None    Cardiac Risk Stratification High          6 Minute Walk:  6 Minute Walk     Row Name 12/11/23 0914         6 Minute Walk   Phase Initial     Distance 1505 feet     Walk Time 6 minutes     # of Rest Breaks 0     MPH 2.85     METS 3.09     RPE 11     VO2 Peak 10.83     Symptoms Yes (comment)     Comments chronic back pain 2/10     Resting HR 62 bpm     Resting BP 96/70     Resting Oxygen Saturation  97 %     Exercise Oxygen Saturation  during 6 min walk 96 %     Max Ex. HR 99 bpm     Max Ex. BP 166/74     2 Minute Post BP 132/72        Oxygen Initial Assessment:   Oxygen Re-Evaluation:   Oxygen Discharge (Final Oxygen Re-Evaluation):   Initial Exercise Prescription:  Initial Exercise Prescription - 12/11/23 0900       Date of Initial  Exercise RX and Referring Provider   Date 12/11/23    Referring Provider Tobie Pamella Guadalajara MD      Oxygen   Maintain Oxygen Saturation 88% or higher      Treadmill   MPH 2.7    Grade 1    Minutes 15  METs 3.44      REL-XR   Level 3    Speed 50    Minutes 15    METs 3      Prescription Details   Frequency (times per week) 3    Duration Progress to 30 minutes of continuous aerobic without signs/symptoms of physical distress      Intensity   THRR 40-80% of Max Heartrate 94-126    Ratings of Perceived Exertion 11-13    Perceived Dyspnea 0-4      Progression   Progression Continue to progress workloads to maintain intensity without signs/symptoms of physical distress.      Resistance Training   Training Prescription Yes    Weight 4 lb    Reps 10-15          Perform Capillary Blood Glucose checks as needed.  Exercise Prescription Changes:   Exercise Prescription Changes     Row Name 12/11/23 0900 01/01/24 1300           Response to Exercise   Blood Pressure (Admit) 96/70 108/66      Blood Pressure (Exercise) 166/74 --      Blood Pressure (Exit) 132/72 100/62      Heart Rate (Admit) 62 bpm 70 bpm      Heart Rate (Exercise) 99 bpm 113 bpm      Heart Rate (Exit) 64 bpm 75 bpm      Oxygen Saturation (Admit) 97 % --      Oxygen Saturation (Exercise) 96 % --      Rating of Perceived Exertion (Exercise) 11 10      Symptoms chronic back pain 2/10 --      Comments walk test results --      Duration -- Continue with 30 min of aerobic exercise without signs/symptoms of physical distress.      Intensity -- THRR unchanged        Progression   Progression -- Continue to progress workloads to maintain intensity without signs/symptoms of physical distress.        Resistance Training   Training Prescription -- Yes      Weight -- 4      Reps -- 10-15        Treadmill   MPH -- 3      Grade -- 1.5      Minutes -- 15      METs -- 3.92        REL-XR   Level --  4      Speed -- 48      Minutes -- 15      METs -- 3.2         Exercise Comments:   Exercise Comments     Row Name 12/13/23 1036           Exercise Comments First full day of exercise!  Patient was oriented to gym and equipment including functions, settings, policies, and procedures.  Patient's individual exercise prescription and treatment plan were reviewed.  All starting workloads were established based on the results of the 6 minute walk test done at initial orientation visit.  The plan for exercise progression was also introduced and progression will be customized based on patient's performance and goals.          Exercise Goals and Review:   Exercise Goals     Row Name 12/11/23 [phone]             Exercise Goals   Increase Physical  Activity Yes       Intervention Provide advice, education, support and counseling about physical activity/exercise needs.;Develop an individualized exercise prescription for aerobic and resistive training based on initial evaluation findings, risk stratification, comorbidities and participant's personal goals.       Expected Outcomes Short Term: Attend rehab on a regular basis to increase amount of physical activity.;Long Term: Exercising regularly at least 3-5 days a week.;Long Term: Add in home exercise to make exercise part of routine and to increase amount of physical activity.       Increase Strength and Stamina Yes       Intervention Develop an individualized exercise prescription for aerobic and resistive training based on initial evaluation findings, risk stratification, comorbidities and participant's personal goals.;Provide advice, education, support and counseling about physical activity/exercise needs.       Expected Outcomes Short Term: Increase workloads from initial exercise prescription for resistance, speed, and METs.;Short Term: Perform resistance training exercises routinely during rehab and add in resistance training at home;Long  Term: Improve cardiorespiratory fitness, muscular endurance and strength as measured by increased METs and functional capacity ( )       Able to understand and use rate of perceived exertion (RPE) scale Yes       Intervention Provide education and explanation on how to use RPE scale       Expected Outcomes Short Term: Able to use RPE daily in rehab to express subjective intensity level;Long Term:  Able to use RPE to guide intensity level when exercising independently       Able to understand and use Dyspnea scale Yes       Intervention Provide education and explanation on how to use Dyspnea scale       Expected Outcomes Short Term: Able to use Dyspnea scale daily in rehab to express subjective sense of shortness of breath during exertion;Long Term: Able to use Dyspnea scale to guide intensity level when exercising independently       Knowledge and understanding of Target Heart Rate Range (THRR) Yes       Intervention Provide education and explanation of THRR including how the numbers were predicted and where they are located for reference       Expected Outcomes Short Term: Able to state/look up THRR;Long Term: Able to use THRR to govern intensity when exercising independently;Short Term: Able to use daily as guideline for intensity in rehab       Able to check pulse independently Yes       Intervention Provide education and demonstration on how to check pulse in carotid and radial arteries.;Review the importance of being able to check your own pulse for safety during independent exercise       Expected Outcomes Short Term: Able to explain why pulse checking is important during independent exercise;Long Term: Able to check pulse independently and accurately       Understanding of Exercise Prescription Yes       Intervention Provide education, explanation, and written materials on patient's individual exercise prescription       Expected Outcomes Short Term: Able to explain program exercise  prescription;Long Term: Able to explain home exercise prescription to exercise independently          Exercise Goals Re-Evaluation :  Exercise Goals Re-Evaluation     Row Name 12/13/23 1036 01/01/24 1330 01/12/24 0939         Exercise Goal Re-Evaluation   Exercise Goals Review Able to understand and use rate of perceived exertion (  RPE) scale;Knowledge and understanding of Target Heart Rate Range (THRR) Increase Physical Activity;Increase Strength and Stamina;Understanding of Exercise Prescription Increase Physical Activity;Increase Strength and Stamina;Understanding of Exercise Prescription     Comments Reviewed RPE and dyspnea scale, THR and program prescription with pt today.  Pt voiced understanding and was given a copy of goals to take home. Carol is doing well in rehab. He has completed 11 sessions of CR. He has increased his walking speed on the treadmill to 3.0 with a grade of 1.5. Will continue to monitor and progress as able. Davis is doing well in rehab. He stated that he does enjoy coming to class and feels good after exercise. He is doing resistance bands at home but is not  walking . He has noticed that he is able to do stairs easier then before the start of the program.     Expected Outcomes Short: Use RPE daily to regulate intensity.  Long: Follow program prescription in THR. Short: increase levels when able   long: continue to exercsie Short: add walking at homw into week   long: continues to attend rehab         Discharge Exercise Prescription (Final Exercise Prescription Changes):  Exercise Prescription Changes - 01/01/24 1300       Response to Exercise   Blood Pressure (Admit) 108/66    Blood Pressure (Exit) 100/62    Heart Rate (Admit) 70 bpm    Heart Rate (Exercise) 113 bpm    Heart Rate (Exit) 75 bpm    Rating of Perceived Exertion (Exercise) 10    Duration Continue with 30 min of aerobic exercise without signs/symptoms of physical distress.    Intensity THRR  unchanged      Progression   Progression Continue to progress workloads to maintain intensity without signs/symptoms of physical distress.      Resistance Training   Training Prescription Yes    Weight 4    Reps 10-15      Treadmill   MPH 3    Grade 1.5    Minutes 15    METs 3.92      REL-XR   Level 4    Speed 48    Minutes 15    METs 3.2          Nutrition:  Target Goals: Understanding of nutrition guidelines, daily intake of sodium 1500mg , cholesterol 200mg , calories 30% from fat and 7% or less from saturated fats, daily to have 5 or more servings of fruits and vegetables.  Biometrics:  Pre Biometrics - 12/11/23 0917       Pre Biometrics   Height 6' 1 (1.854 m)    Weight 216 lb 11.2 oz (98.3 kg)    Waist Circumference 40 inches    Hip Circumference 41.5 inches    Waist to Hip Ratio 0.96 %    BMI (Calculated) 28.6    Grip Strength 39.7 kg    Single Leg Stand 30 seconds           Nutrition Therapy Plan and Nutrition Goals:   Nutrition Assessments:  MEDIFICTS Score Key: >=70 Need to make dietary changes  40-70 Heart Healthy Diet <= 40 Therapeutic Level Cholesterol Diet  Flowsheet Row CARDIAC REHAB PHASE II ORIENTATION from 12/11/2023 in [facility] CARDIAC RE[facility]ILITATION  Picture Your Plate Total Score on Admission 30   Picture Your Plate Scores: <59 Unhealthy dietary pattern with much room for improvement. 41-50 Dietary pattern unlikely to meet recommendations for good health and room  for improvement. 51-60 More healthful dietary pattern, with some room for improvement.  >60 Healthy dietary pattern, although there may be some specific behaviors that could be improved.    Nutrition Goals Re-Evaluation:  Nutrition Goals Re-Evaluation     Row Name 01/12/24 0945             Goals   Nutrition Goal healthy eating       Comment Tawan is doing well in rehab. He is trying to eat healthier meals. He is eating mostly chicken that is baked but will  fry it sometimes. He is eatting lots of veggies and loves apples and small oranges Cuties. His A1C was on the higher end 6 months ago so he has made a lifestyle change cutting out white bread, sweet tea and other sweets to help lower A1C and prevent being diabetic. He is not much of a salt eater and does not eat things lilke chips.       Expected Outcome Short: try to cut down on fried chiecken  long: continue to focus on healthy eating          Nutrition Goals Discharge (Final Nutrition Goals Re-Evaluation):  Nutrition Goals Re-Evaluation - 01/12/24 0945       Goals   Nutrition Goal healthy eating    Comment Malvin is doing well in rehab. He is trying to eat healthier meals. He is eating mostly chicken that is baked but will fry it sometimes. He is eatting lots of veggies and loves apples and small oranges Cuties. His A1C was on the higher end 6 months ago so he has made a lifestyle change cutting out white bread, sweet tea and other sweets to help lower A1C and prevent being diabetic. He is not much of a salt eater and does not eat things lilke chips.    Expected Outcome Short: try to cut down on fried chiecken  long: continue to focus on healthy eating          Psychosocial: Target Goals: Acknowledge presence or absence of significant depression and/or stress, maximize coping skills, provide positive support system. Participant is able to verbalize types and ability to use techniques and skills needed for reducing stress and depression.  Initial Review & Psychosocial Screening:  Initial Psych Review & Screening - 12/08/23 1458       Initial Review   Current issues with None Identified      Family Dynamics   Good Support System? Yes    Comments Patient's wife and 2 children support him.      Barriers   Psychosocial barriers to participate in program The patient should benefit from training in stress management and relaxation.;There are no identifiable barriers or psychosocial  needs.      Screening Interventions   Interventions To provide support and resources with identified psychosocial needs;Encouraged to exercise;Program counselor consult;Provide feedback about the scores to participant    Expected Outcomes Short Term goal: Utilizing psychosocial counselor, staff and physician to assist with identification of specific Stressors or current issues interfering with healing process. Setting desired goal for each stressor or current issue identified.;Long Term Goal: Stressors or current issues are controlled or eliminated.;Short Term goal: Identification and review with participant of any Quality of Life or Depression concerns found by scoring the questionnaire.;Long Term goal: The participant improves quality of Life and PHQ9 Scores as seen by post scores and/or verbalization of changes          Quality of Life Scores:  Quality  of Life - 12/11/23 0917       Quality of Life   Select Quality of Life      Quality of Life Scores   Health/Function Pre 21.83 %    Socioeconomic Pre 25.71 %    Psych/Spiritual Pre 20.79 %    Family Pre 28.8 %    GLOBAL Pre 23.44 %         Scores of 19 and below usually indicate a poorer quality of life in these areas.  A difference of  2-3 points is a clinically meaningful difference.  A difference of 2-3 points in the total score of the Quality of Life Index has been associated with significant improvement in overall quality of life, self-image, physical symptoms, and general health in studies assessing change in quality of life.  PHQ-9: Review Flowsheet  More data may exist      12/11/2023 11/02/2016 07/21/2016 11/05/2015 06/16/2015  Depression screen PHQ 2/9  Decreased Interest 1 1 1 1 1   Down, Depressed, Hopeless 0 0 0 0 1  PHQ - 2 Score 1 1 1 1 2   Altered sleeping 0 1 0 0 0  Tired, decreased energy 1 2 2 1  0  Change in appetite 0 0 0 0 0  Feeling bad or failure about yourself  0 0 0 0 0  Trouble concentrating 0 0 0 0 0   Moving slowly or fidgety/restless 0 0 0 0 0  Suicidal thoughts 0 0 0  0  0   PHQ-9 Score 2 4 3 2 2   Difficult doing work/chores Not difficult at all Somewhat difficult Somewhat difficult Somewhat difficult Somewhat difficult    Details       Data saved with a previous flowsheet row definition        Interpretation of Total Score  Total Score Depression Severity:  1-4 = Minimal depression, 5-9 = Mild depression, 10-14 = Moderate depression, 15-19 = Moderately severe depression, 20-27 = Severe depression   Psychosocial Evaluation and Intervention:  Psychosocial Evaluation - 12/08/23 1458       Psychosocial Evaluation & Interventions   Interventions Encouraged to exercise with the program and follow exercise prescription;Relaxation education;Stress management education    Comments Patient was referred to cardiac rehab from the [place] with chronic systolic HF. He has completed the program in 2018 here at [facility]. He denies any depression, anxiety or current stressors. He says he sleeps well. He has OSA but does not use CPAP. 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 He is currently smoking 10 cigars/week and has no interest in quitting at this time. He enjoys playing the guitar and spending time with his grandchildren. His goals for the program are to increase his energy; get stronger; and get back to doing his normal activities. He has no barriers identified to complete the program.    Expected Outcomes Short Term: Patient will start the program and attend consistently. Long Term: Patient will complete the program meeting personal goals.    Continue Psychosocial Services  Follow up required by staff          Psychosocial Re-Evaluation:  Psychosocial Re-Evaluation     Row Name 01/12/24 [phone]             Psychosocial Re-Evaluation   Current issues with None Identified       Comments Drystan stated that he has been feeling good and  does not have  any stress or sleeping issues. He does sleep 7-8 hours a night with getting up once but is able to go back to bed.       Expected Outcomes Short: continue to have no issees    long: continuet  to exericse for overall happiness       Interventions Encouraged to attend Cardiac Rehabilitation for the exercise       Continue Psychosocial Services  Follow up required by staff          Psychosocial Discharge (Final Psychosocial Re-Evaluation):  Psychosocial Re-Evaluation - 01/12/24 0941       Psychosocial Re-Evaluation   Current issues with None Identified    Comments Iram stated that he has been feeling good and does not have any stress or sleeping issues. He does sleep 7-8 hours a night with getting up once but is able to go back to bed.    Expected Outcomes Short: continue to have no issees    long: continuet  to exericse for overall happiness    Interventions Encouraged to attend Cardiac Rehabilitation for the exercise    Continue Psychosocial Services  Follow up required by staff          Vocational Rehabilitation: Provide vocational rehab assistance to qualifying candidates.   Vocational Rehab Evaluation & Intervention:  Vocational Rehab - 12/08/23 1436       Initial Vocational Rehab Evaluation & Intervention   Assessment shows need for Vocational Rehabilitation No          Education: Education Goals: Education classes will be provided on a weekly basis, covering required topics. Participant will state understanding/return demonstration of topics presented.  Learning Barriers/Preferences:  Learning Barriers/Preferences - 12/08/23 1436       Learning Barriers/Preferences   Learning Barriers None    Learning Preferences Skilled Demonstration;Individual Instruction;Group Instruction          Education Topics: Hypertension, Hypertension Reduction -Define heart disease and high blood pressure. Discus how high blood pressure affects the body and ways to  reduce high blood pressure. Flowsheet Row CARDIAC REHAB PHASE II EXERCISE from 08/17/2016 in [place] [facility]  Date 08/17/16  Educator Loa Sheets  Instruction Review Code (retired) 2- meets goals/outcomes    Exercise and Your Heart -Discuss why it is important to exercise, the FITT principles of exercise, normal and abnormal responses to exercise, and how to exercise safely. Flowsheet Row CARDIAC REHAB PHASE II EXERCISE from 09/16/2015 in [place] [facility]  Date 08/26/15  Educator Loa Sheets  Instruction Review Code (retired) 2- meets goals/outcomes    Angina -Discuss definition of angina, causes of angina, treatment of angina, and how to decrease risk of having angina. Flowsheet Row CARDIAC REHAB PHASE II EXERCISE from 09/16/2015 in [place] [facility]  Date 09/02/15  Educator DCoad  Instruction Review Code 2- meets goals/outcomes    Cardiac Medications -Review what the following cardiac medications are used for, how they affect the body, and side effects that may occur when taking the medications.  Medications include Aspirin , Beta blockers, calcium  channel blockers, ACE Inhibitors, angiotensin receptor blockers, diuretics, digoxin, and antihyperlipidemics.   Congestive Heart Failure -Discuss the definition of CHF, how to live with CHF, the signs and symptoms of CHF, and how keep track of weight and sodium intake. Flowsheet Row CARDIAC REHAB PHASE II EXERCISE from 09/16/2015 in [place] [facility]  Date 09/16/15  Educator DC  Instruction Review Code 2- meets goals/outcomes    Heart Disease and Intimacy -  Discus the effect sexual activity has on the heart, how changes occur during intimacy as we age, and safety during sexual activity. Flowsheet Row CARDIAC REHAB PHASE II EXERCISE from 09/16/2015 in [place] [facility]  Date 06/24/15  Educator Loa Sheets  Instruction Review Code (retired) 2- meets  goals/outcomes    Smoking Cessation / COPD -Discuss different methods to quit smoking, the health benefits of quitting smoking, and the definition of COPD. Flowsheet Row CARDIAC REHAB PHASE II EXERCISE from 09/16/2015 in [place] [facility]  Date 07/01/15  Educator Loa Sheets  Instruction Review Code (retired) 2- meets goals/outcomes    Nutrition I: Fats -Discuss the types of cholesterol, what cholesterol does to the heart, and how cholesterol levels can be controlled. Flowsheet Row CARDIAC REHAB PHASE II EXERCISE from 09/16/2015 in [place] [facility]  Date 07/08/15  Educator Loa Sheets  Instruction Review Code 2- meets goals/outcomes    Nutrition II: Labels -Discuss the different components of food labels and how to read food label Flowsheet Row CARDIAC REHAB PHASE II EXERCISE from 09/16/2015 in [place] [facility]  Date 07/15/15  Educator Loa Sheets  Instruction Review Code 2- meets goals/outcomes    Heart Parts/Heart Disease and PAD -Discuss the anatomy of the heart, the pathway of blood circulation through the heart, and these are affected by heart disease. Flowsheet Row CARDIAC REHAB PHASE II EXERCISE from 09/16/2015 in [place] [facility]  Date 07/22/15  Educator Loa Sheets  Instruction Review Code 2- meets goals/outcomes    Stress I: Signs and Symptoms -Discuss the causes of stress, how stress may lead to anxiety and depression, and ways to limit stress. Flowsheet Row CARDIAC REHAB PHASE II EXERCISE from 09/16/2015 in [place] [facility]  Date 07/29/15  Educator Loa Sheets  Instruction Review Code 2- meets goals/outcomes    Stress II: Relaxation -Discuss different types of relaxation techniques to limit stress. Flowsheet Row CARDIAC REHAB PHASE II EXERCISE from 08/17/2016 in [place] [facility]  Date 08/17/16    Warning Signs of Stroke / TIA -Discuss definition of a stroke, what the  signs and symptoms are of a stroke, and how to identify when someone is having stroke. Flowsheet Row CARDIAC REHAB PHASE II EXERCISE from 09/16/2015 in [place] [facility]  Date 08/12/15  Educator Loa Sheets  Instruction Review Code 2- meets goals/outcomes    Knowledge Questionnaire Score:  Knowledge Questionnaire Score - 12/13/23 1151       Knowledge Questionnaire Score   Pre Score 20/26          Core Components/Risk Factors/Patient Goals at Admission:  Personal Goals and Risk Factors at Admission - 12/11/23 0918       Core Components/Risk Factors/Patient Goals on Admission    Weight Management Yes;Weight Loss    Intervention Weight Management: Develop a combined nutrition and exercise program designed to reach desired caloric intake, while maintaining appropriate intake of nutrient and fiber, sodium and fats, and appropriate energy expenditure required for the weight goal.;Weight Management: Provide education and appropriate resources to help participant work on and attain dietary goals.;Weight Management/Obesity: Establish reasonable short term and long term weight goals.    Admit Weight 216 lb 11.2 oz (98.3 kg)    Goal Weight: Short Term 211 lb (95.7 kg)    Goal Weight: Long Term 200 lb (90.7 kg)    Expected Outcomes Short Term: Continue to assess and modify interventions until short term weight is achieved;Long Term: Adherence to nutrition  and physical activity/exercise program aimed toward attainment of established weight goal;Weight Loss: Understanding of general recommendations for a balanced deficit meal plan, which promotes 1-2 lb weight loss per week and includes a negative energy balance of [id number] kcal/d;Understanding recommendations for meals to include 15-35% energy as protein, 25-35% energy from fat, 35-60% energy from carbohydrates, less than 200mg  of dietary cholesterol, 20-35 gm of total fiber daily;Understanding of distribution of calorie intake  throughout the day with the consumption of 4-5 meals/snacks    Tobacco Cessation Yes    Intervention Assist the participant in steps to quit. Provide individualized education and counseling about committing to Tobacco Cessation, relapse prevention, and pharmacological support that can be provided by physician.;Education officer, environmental, assist with locating and accessing local/national Quit Smoking programs, and support quit date choice.    Expected Outcomes Short Term: Will demonstrate readiness to quit, by selecting a quit date.;Long Term: Complete abstinence from all tobacco products for at least 12 months from quit date.;Short Term: Will quit all tobacco product use, adhering to prevention of relapse plan.    Heart Failure Yes    Intervention Provide a combined exercise and nutrition program that is supplemented with education, support and counseling about heart failure. Directed toward relieving symptoms such as shortness of breath, decreased exercise tolerance, and extremity edema.    Expected Outcomes Improve functional capacity of life;Short term: Attendance in program 2-3 days a week with increased exercise capacity. Reported lower sodium intake. Reported increased fruit and vegetable intake. Reports medication compliance.;Short term: Daily weights obtained and reported for increase. Utilizing diuretic protocols set by physician.;Long term: Adoption of self-care skills and reduction of barriers for early signs and symptoms recognition and intervention leading to self-care maintenance.    Hypertension Yes    Intervention Provide education on lifestyle modifcations including regular physical activity/exercise, weight management, moderate sodium restriction and increased consumption of fresh fruit, vegetables, and low fat dairy, alcohol moderation, and smoking cessation.;Monitor prescription use compliance.    Expected Outcomes Short Term: Continued assessment and intervention until BP is <  140/46mm HG in hypertensive participants. < 130/75mm HG in hypertensive participants with diabetes, heart failure or chronic kidney disease.;Long Term: Maintenance of blood pressure at goal levels.    Lipids Yes    Intervention Provide education and support for participant on nutrition & aerobic/resistive exercise along with prescribed medications to achieve LDL 70mg , HDL >40mg .    Expected Outcomes Short Term: Participant states understanding of desired cholesterol values and is compliant with medications prescribed. Participant is following exercise prescription and nutrition guidelines.;Long Term: Cholesterol controlled with medications as prescribed, with individualized exercise RX and with personalized nutrition plan. Value goals: LDL < 70mg , HDL > 40 mg.          Core Components/Risk Factors/Patient Goals Review:   Goals and Risk Factor Review     Row Name 01/12/24 0951             Core Components/Risk Factors/Patient Goals Review   Personal Goals Review Weight Management/Obesity;Diabetes       Review Antoin is doing well in rehab. He has improved since starting the program and has been feeling better. He is watching his weight and eating better. His A1C was on the higher end 6 months ago so he has been making nutritional changes to help lower it.       Expected Outcomes short: contineu to cut back on suger for A1C   long: continue to exercise for overall happiness  Core Components/Risk Factors/Patient Goals at Discharge (Final Review):   Goals and Risk Factor Review - 01/12/24 0951       Core Components/Risk Factors/Patient Goals Review   Personal Goals Review Weight Management/Obesity;Diabetes    Review Ronnie is doing well in rehab. He has improved since starting the program and has been feeling better. He is watching his weight and eating better. His A1C was on the higher end 6 months ago so he has been making nutritional changes to help lower it.    Expected Outcomes  short: contineu to cut back on suger for A1C   long: continue to exercise for overall happiness          ITP Comments:  ITP Comments     Row Name 12/08/23 1506 12/11/23 0923 12/13/23 1036 12/27/23 1427 01/24/24 1013   ITP Comments Virtual orientation visit completed for cardiac rehab with chronic systolic HF. On-site orientation visit scheduled for 12/11/23 at 0800. Patient arrived for 1st visit/orientation/education at 0800. Patient was referred to CR by Dr. Pamella Blanch at the [facility] due to Chronic systolic HF. During orientation advised patient on arrival and appointment times what to wear, what to do before, during and after exercise. Reviewed attendance and class policy.  Pt is scheduled to return Cardiac Rehab on 12/13/23 at 915. Pt was advised to come to class 15 minutes before class starts.  Discussed RPE/Dpysnea scales. Patient participated in warm up stretches. Patient was able to complete 6 minute walk test.  Telemetry:NSR. Patient was measured for the equipment. Discussed equipment safety with patient. Took patient pre-anthropometric measurements. Patient finished visit at 915. First full day of exercise!  Patient was oriented to gym and equipment including functions, settings, policies, and procedures.  Patient's individual exercise prescription and treatment plan were reviewed.  All starting workloads were established based on the results of the 6 minute walk test done at initial orientation visit.  The plan for exercise progression was also introduced and progression will be customized based on patient's performance and goals. 30 day review completed. ITP sent to Dr. Dorn Ross, Medical Director of Cardiac Rehab. Continue with ITP unless changes are made by physician. Still new to program 30 day review completed. ITP sent to Dr. Dorn Ross, Medical Director of Cardiac Rehab. Continue with ITP unless changes are made by physician.      Comments: 30 day review

## 2024-01-26 ENCOUNTER — Encounter (HOSPITAL_COMMUNITY)
Admission: RE | Admit: 2024-01-26 | Discharge: 2024-01-26 | Disposition: A | Discharge status: Home / Self Care | Source: Ambulatory Visit | Attending: Internal Medicine | Admitting: Internal Medicine

## 2024-01-26 DIAGNOSIS — I5022 Chronic systolic (congestive) heart failure: Secondary | ICD-10-CM

## 2024-01-26 NOTE — Progress Notes (Signed)
 Daily Session Note  Patient Details  Name: Stephen Macdonald MRN: [id number] Date of Birth: 09-09-1945 Referring Provider:   Flowsheet Row CARDIAC REHAB PHASE II ORIENTATION from 12/11/2023 in [facility] C[facility] REH[facility]ILIT[facility]  Referring Provider Tobie Pamella Guadalajara MD    Encounter Date: 01/26/2024  Check In:  Session Check In - 01/26/24 9077       Check-In   Supervising physician immediately available to respond to emergencies See telemetry face sheet for immediately available MD    Location [facility]    Staff Present Adrien Louder, RN, BSN;Heather Con HECKLE, Exercise Physiologist;Caitland Porchia Zina, RN    Virtual Visit No    Medication changes reported     No    Fall or balance concerns reported    No    Warm-up and Cool-down Performed on first and last piece of equipment    Resistance Training Performed Yes    VAD Patient? No    PAD/SET Patient? No      Pain Assessment   Currently in Pain? No/denies          Capillary Blood Glucose: No results found for this or any previous visit (from the past 24 hours).    Social History   Tobacco Use  Smoking Status Some Days   Types: Cigars, Cigarettes  Smokeless Tobacco Former   Types: Snuff  Tobacco Comments   Discussed cessation and shared we have classes that he could attend.     Goals Met:  Independence with exercise equipment Exercise tolerated well No report of concerns or symptoms today Strength training completed today  Goals Unmet:  Not Applicable  Comments: Pt able to follow exercise prescription today without complaint.  Will continue to monitor for progression.

## 2024-01-29 ENCOUNTER — Encounter (HOSPITAL_COMMUNITY)
Admission: RE | Admit: 2024-01-29 | Discharge: 2024-01-29 | Disposition: A | Discharge status: Home / Self Care | Source: Ambulatory Visit | Attending: Internal Medicine | Admitting: Internal Medicine

## 2024-01-29 DIAGNOSIS — I5022 Chronic systolic (congestive) heart failure: Secondary | ICD-10-CM | POA: Diagnosis not present

## 2024-01-29 NOTE — Progress Notes (Signed)
 Daily Session Note  Patient Details  Name: Stephen Macdonald MRN: [id number] Date of Birth: 1946-03-13 Referring Provider:   Flowsheet Row CARDIAC REHAB PHASE II ORIENTATION from 12/11/2023 in [facility] CARDIAC REH[facility]ILIT[facility]  Referring Provider Tobie Pamella Guadalajara MD    Encounter Date: 01/29/2024  Check In:  Session Check In - 01/29/24 0917       Check-In   Supervising physician immediately available to respond to emergencies See telemetry face sheet for immediately available MD    Location [facility]    Staff Present Powell Benders, BS, Exercise Physiologist;Harveen Flesch Vonzell, MA, RCEP, CCRP, Sueellen Louder, RN, BSN    Virtual Visit No    Medication changes reported     No    Fall or balance concerns reported    No    Warm-up and Cool-down Performed on first and last piece of equipment    Resistance Training Performed Yes    VAD Patient? No    PAD/SET Patient? No      Pain Assessment   Currently in Pain? No/denies          Capillary Blood Glucose: No results found for this or any previous visit (from the past 24 hours).    Social History   Tobacco Use  Smoking Status Some Days   Types: Cigars, Cigarettes  Smokeless Tobacco Former   Types: Snuff  Tobacco Comments   Discussed cessation and shared we have classes that he could attend.     Goals Met:  Independence with exercise equipment Exercise tolerated well No report of concerns or symptoms today Strength training completed today  Goals Unmet:  Not Applicable  Comments: Pt able to follow exercise prescription today without complaint.  Will continue to monitor for progression.

## 2024-01-31 ENCOUNTER — Encounter (HOSPITAL_COMMUNITY)
Admission: RE | Admit: 2024-01-31 | Discharge: 2024-01-31 | Disposition: A | Discharge status: Home / Self Care | Source: Ambulatory Visit | Attending: Internal Medicine | Admitting: Internal Medicine

## 2024-01-31 DIAGNOSIS — I5022 Chronic systolic (congestive) heart failure: Secondary | ICD-10-CM | POA: Diagnosis not present

## 2024-01-31 NOTE — Progress Notes (Signed)
 Daily Session Note  Patient Details  Name: ABRAHIM SARGENT MRN: [id number] Date of Birth: 1945/09/22 Referring Provider:   Flowsheet Row CARDIAC REHAB PHASE II ORIENTATION from 12/11/2023 in [facility] CARDIAC REHABILITATION  Referring Provider Tobie Pamella Guadalajara MD    Encounter Date: 01/31/2024  Check In:  Session Check In - 01/31/24 0931       Check-In   Supervising physician immediately available to respond to emergencies See telemetry face sheet for immediately available MD    Location [facility]    Staff Present Powell Benders, BS, Exercise Physiologist;Brittany Jackquline, BSN, RN, Rosalba Gelineau, MA, RCEP, CCRP, CCET;Hillary [employer], RN    Virtual Visit No    Medication changes reported     No    Fall or balance concerns reported    No    Tobacco Cessation No Change    Warm-up and Cool-down Performed on first and last piece of equipment    Resistance Training Performed Yes    VAD Patient? No    PAD/SET Patient? No      Pain Assessment   Currently in Pain? No/denies    Multiple Pain Sites No          Capillary Blood Glucose: No results found for this or any previous visit (from the past 24 hours).    Social History   Tobacco Use  Smoking Status Some Days   Types: Cigars, Cigarettes  Smokeless Tobacco Former   Types: Snuff  Tobacco Comments   Discussed cessation and shared we have classes that he could attend.     Goals Met:  Independence with exercise equipment Exercise tolerated well No report of concerns or symptoms today Strength training completed today  Goals Unmet:  Not Applicable  Comments: Pt able to follow exercise prescription today without complaint.  Will continue to monitor for progression.

## 2024-02-02 ENCOUNTER — Encounter (HOSPITAL_COMMUNITY)
Admission: RE | Admit: 2024-02-02 | Discharge: 2024-02-02 | Disposition: A | Discharge status: Home / Self Care | Source: Ambulatory Visit | Attending: Internal Medicine | Admitting: Internal Medicine

## 2024-02-02 DIAGNOSIS — I5022 Chronic systolic (congestive) heart failure: Secondary | ICD-10-CM | POA: Diagnosis not present

## 2024-02-02 NOTE — Progress Notes (Signed)
 Daily Session Note  Patient Details  Name: KAVIAN PETERS MRN: [id number] Date of Birth: [birth year] Referring Provider:   Flowsheet Row CARDIAC REHAB PHASE II ORIENTATION from 12/11/2023 in [facility] CARDIAC REHABILITATION  Referring Provider Tobie Pamella Guadalajara MD    Encounter Date: 02/02/2024  Check In:  Session Check In - 02/02/24 0914       Check-In   Supervising physician immediately available to respond to emergencies See telemetry face sheet for immediately available MD    Location [facility]    Staff Present Powell Benders, BS, Exercise Physiologist;Pecola Haxton Zina, RN    Virtual Visit No    Medication changes reported     No    Fall or balance concerns reported    No    Warm-up and Cool-down Performed on first and last piece of equipment    Resistance Training Performed Yes    PAD/SET Patient? No      Pain Assessment   Currently in Pain? No/denies          Capillary Blood Glucose: No results found for this or any previous visit (from the past 24 hours).    Social History   Tobacco Use  Smoking Status Some Days   Types: Cigars, Cigarettes  Smokeless Tobacco Former   Types: Snuff  Tobacco Comments   Discussed cessation and shared we have classes that he could attend.     Goals Met:  Independence with exercise equipment Exercise tolerated well No report of concerns or symptoms today Strength training completed today  Goals Unmet:  Not Applicable  Comments: Pt able to follow exercise prescription today without complaint.  Will continue to monitor for progression.

## 2024-02-05 ENCOUNTER — Encounter (HOSPITAL_COMMUNITY)
Admission: RE | Admit: 2024-02-05 | Discharge: 2024-02-05 | Disposition: A | Discharge status: Home / Self Care | Source: Ambulatory Visit | Attending: Internal Medicine | Admitting: Internal Medicine

## 2024-02-05 DIAGNOSIS — I5022 Chronic systolic (congestive) heart failure: Secondary | ICD-10-CM | POA: Diagnosis not present

## 2024-02-05 NOTE — Progress Notes (Signed)
 Daily Session Note  Patient Details  Name: Stephen Macdonald MRN: [id number] Date of Birth: Feb 26, 1946 Referring Provider:   Flowsheet Row CARDIAC REHAB PHASE II ORIENTATION from 12/11/2023 in [facility] CARDIAC REHABILITATION  Referring Provider Tobie Pamella [place]ara MD    Encounter Date: 02/05/2024  Check In:  Session Check In - 02/05/24 9076       Check-In   Supervising physician immediately available to respond to emergencies See telemetry face sheet for immediately available MD    Location [facility]    Staff Present Powell Benders, BS, Exercise Physiologist;Brittany Jackquline, BSN, RN, Rosalba Gelineau, MA, RCEP, CCRP, CCET    Virtual Visit No    Medication changes reported     No    Fall or balance concerns reported    No    Tobacco Cessation No Change    Warm-up and Cool-down Performed on first and last piece of equipment    Resistance Training Performed Yes    VAD Patient? No    PAD/SET Patient? No      Pain Assessment   Currently in Pain? No/denies    Multiple Pain Sites No          Capillary Blood Glucose: No results found for this or any previous visit (from the past 24 hours).    Social History   Tobacco Use  Smoking Status Some Days   Types: Cigars, Cigarettes  Smokeless Tobacco Former   Types: Snuff  Tobacco Comments   Discussed cessation and shared we have classes that he could attend.     Goals Met:  Independence with exercise equipment Exercise tolerated well No report of concerns or symptoms today Strength training completed today  Goals Unmet:  Not Applicable  Comments: Pt able to follow exercise prescription today without complaint.  Will continue to monitor for progression.

## 2024-02-07 ENCOUNTER — Encounter (HOSPITAL_COMMUNITY)
Admission: RE | Admit: 2024-02-07 | Discharge: 2024-02-07 | Disposition: A | Discharge status: Home / Self Care | Source: Ambulatory Visit | Attending: Internal Medicine | Admitting: Internal Medicine

## 2024-02-07 DIAGNOSIS — I5022 Chronic systolic (congestive) heart failure: Secondary | ICD-10-CM | POA: Diagnosis not present

## 2024-02-07 NOTE — Progress Notes (Signed)
 Daily Session Note  Patient Details  Name: Stephen Macdonald MRN: [id number] Date of Birth: [birth year] Referring Provider:   Flowsheet Row CARDIAC REHAB PHASE II ORIENTATION from 12/11/2023 in [facility] CARDIAC REHABILITATION  Referring Provider Tobie Pamella [place] MD    Encounter Date: 02/07/2024  Check In:  Session Check In - 02/07/24 0913       Check-In   Supervising physician immediately available to respond to emergencies See telemetry face sheet for immediately available MD    Location [facility]    Staff Present Ellouise Gear, RN;Jessica Vonzell, MA, RCEP, CCRP, CCET    Virtual Visit No    Medication changes reported     No    Fall or balance concerns reported    No    Tobacco Cessation No Change    Warm-up and Cool-down Performed on first and last piece of equipment    Resistance Training Performed Yes    VAD Patient? No    PAD/SET Patient? No      Pain Assessment   Currently in Pain? No/denies          Capillary Blood Glucose: No results found for this or any previous visit (from the past 24 hours).    Social History   Tobacco Use  Smoking Status Some Days   Types: Cigars, Cigarettes  Smokeless Tobacco Former   Types: Snuff  Tobacco Comments   Discussed cessation and shared we have classes that he could attend.     Goals Met:  Independence with exercise equipment Exercise tolerated well No report of concerns or symptoms today Strength training completed today  Goals Unmet:  Not Applicable  Comments: Pt able to follow exercise prescription today without complaint.  Will continue to monitor for progression.

## 2024-02-09 ENCOUNTER — Encounter (HOSPITAL_COMMUNITY)

## 2024-02-12 ENCOUNTER — Encounter (HOSPITAL_COMMUNITY)
Admission: RE | Admit: 2024-02-12 | Discharge: 2024-02-12 | Disposition: A | Source: Ambulatory Visit | Attending: Internal Medicine

## 2024-02-12 DIAGNOSIS — I5022 Chronic systolic (congestive) heart failure: Secondary | ICD-10-CM

## 2024-02-12 NOTE — Progress Notes (Signed)
 Daily Session Note  Patient Details  Name: NATIVIDAD HALLS MRN: [id number] Date of Birth: Nov 01, 1945 Referring Provider:   Flowsheet Row CARDIAC REHAB PHASE II ORIENTATION from 12/11/2023 in [facility] CARDIAC REHABILITATION  Referring Provider Tobie Pamella [place] MD    Encounter Date: 02/12/2024  Check In:  Session Check In - 02/12/24 0949       Check-In   Supervising physician immediately available to respond to emergencies See telemetry face sheet for immediately available MD    Location [facility]    Staff Present Powell Benders, BS, Exercise Physiologist;Bonham Zingale Vonzell, MA, RCEP, CCRP, CCET;Brittany Jackquline, BSN, RN, WTA-C    Virtual Visit No    Medication changes reported     No    Warm-up and Cool-down Performed on first and last piece of equipment    Resistance Training Performed Yes    VAD Patient? No    PAD/SET Patient? No      Pain Assessment   Currently in Pain? No/denies          Capillary Blood Glucose: No results found for this or any previous visit (from the past 24 hours).    Social History   Tobacco Use  Smoking Status Some Days   Types: Cigars, Cigarettes  Smokeless Tobacco Former   Types: Snuff  Tobacco Comments   Discussed cessation and shared we have classes that he could attend.     Goals Met:  Independence with exercise equipment Exercise tolerated well No report of concerns or symptoms today Strength training completed today  Goals Unmet:  Not Applicable  Comments: Pt able to follow exercise prescription today without complaint.  Will continue to monitor for progression.

## 2024-02-14 ENCOUNTER — Encounter (HOSPITAL_COMMUNITY)
Admission: RE | Admit: 2024-02-14 | Discharge: 2024-02-14 | Disposition: A | Discharge status: Home / Self Care | Source: Ambulatory Visit | Attending: Internal Medicine | Admitting: Internal Medicine

## 2024-02-14 VITALS — Ht 73.0 in | Wt 215.4 lb

## 2024-02-14 DIAGNOSIS — I5022 Chronic systolic (congestive) heart failure: Secondary | ICD-10-CM

## 2024-02-14 NOTE — Progress Notes (Signed)
 Daily Session Note  Patient Details  Name: TALYN DESSERT MRN: [id number] Date of Birth: 1945-10-07 Referring Provider:   Flowsheet Row CARDIAC REHAB PHASE II ORIENTATION from 12/11/2023 in [facility] CARDIAC REHABILITATION  Referring Provider Tobie Pamella [place] MD    Encounter Date: 02/14/2024  Check In:  Session Check In - 02/14/24 0935       Check-In   Supervising physician immediately available to respond to emergencies See telemetry face sheet for immediately available MD    Location [facility]    Staff Present Powell Benders, BS, Exercise Physiologist;Jessica Vonzell, MA, RCEP, CCRP, CCET;Drakkar Medeiros Jackquline, BSN, RN, WTA-C    Virtual Visit No    Medication changes reported     No    Fall or balance concerns reported    No    Tobacco Cessation No Change    Warm-up and Cool-down Performed on first and last piece of equipment    Resistance Training Performed Yes    VAD Patient? No    PAD/SET Patient? No      Pain Assessment   Currently in Pain? No/denies          Capillary Blood Glucose: No results found for this or any previous visit (from the past 24 hours).    Social History   Tobacco Use  Smoking Status Some Days   Types: Cigars, Cigarettes  Smokeless Tobacco Former   Types: Snuff  Tobacco Comments   Discussed cessation and shared we have classes that he could attend.     Goals Met:  Independence with exercise equipment Exercise tolerated well No report of concerns or symptoms today Strength training completed today  Goals Unmet:  Not Applicable  Comments: Pt able to follow exercise prescription today without complaint.  Will continue to monitor for progression.

## 2024-02-14 NOTE — Patient Instructions (Signed)
 Discharge Patient Instructions  Patient Details  Name: Stephen Macdonald MRN: [id number] Date of Birth: Sep 06, 1945 Referring Provider:  No ref. provider found   Number of Visits: 36  Reason for Discharge:  Patient reached a stable level of exercise. Patient independent in their exercise. Patient has met program and personal goals.  Smoking History:  Social History   Tobacco Use  Smoking Status Some Days   Types: Cigars, Cigarettes  Smokeless Tobacco Former   Types: Snuff  Tobacco Comments   Discussed cessation and shared we have classes that he could attend.     Diagnosis:  Heart failure, chronic systolic (HCC)  Initial Exercise Prescription:  Initial Exercise Prescription - 12/11/23 0900       Date of Initial Exercise RX and Referring Provider   Date 12/11/23    Referring Provider Tobie Pamella Guadalajara MD      Oxygen   Maintain Oxygen Saturation 88% or higher      Treadmill   MPH 2.7    Grade 1    Minutes 15    METs 3.44      REL-XR   Level 3    Speed 50    Minutes 15    METs 3      Prescription Details   Frequency (times per week) 3    Duration Progress to 30 minutes of continuous aerobic without signs/symptoms of physical distress      Intensity   THRR 40-80% of Max Heartrate 94-126    Ratings of Perceived Exertion 11-13    Perceived Dyspnea 0-4      Progression   Progression Continue to progress workloads to maintain intensity without signs/symptoms of physical distress.      Resistance Training   Training Prescription Yes    Weight 4 lb    Reps 10-15          Discharge Exercise Prescription (Final Exercise Prescription Changes):  Exercise Prescription Changes - 02/05/24 1500       Response to Exercise   Blood Pressure (Admit) 120/64    Blood Pressure (Exit) 106/50    Heart Rate (Admit) 63 bpm    Heart Rate (Exercise) 104 bpm    Heart Rate (Exit) 78 bpm    Rating of Perceived Exertion (Exercise) 10    Duration Continue with 30 min  of aerobic exercise without signs/symptoms of physical distress.    Intensity THRR unchanged      Progression   Progression Continue to progress workloads to maintain intensity without signs/symptoms of physical distress.      Resistance Training   Training Prescription Yes    Weight 4    Reps 10-15      Treadmill   MPH 3.3    Grade 2    Minutes 15    METs 4.43      REL-XR   Level 5    Speed 49    Minutes 15    METs 6.6          Functional Capacity:  6 Minute Walk     Row Name 12/11/23 0914 02/14/24 0942       6 Minute Walk   Phase Initial Discharge    Distance 1505 feet 1650 feet    Distance Feet Change -- 142 ft    Walk Time 6 minutes 6 minutes    # of Rest Breaks 0 0    MPH 2.85 3.12    METS 3.09 3.03    RPE 11 8  Perceived Dyspnea  -- 0    VO2 Peak 10.83 10.6    Symptoms Yes (comment) No    Comments chronic back pain 2/10 --    Resting HR 62 bpm 61 bpm    Resting BP 96/70 100/60    Resting Oxygen Saturation  97 % 98 %    Exercise Oxygen Saturation  during 6 min walk 96 % 98 %    Max Ex. HR 99 bpm 101 bpm    Max Ex. BP 166/74 120/60    2 Minute Post BP 132/72 110/60       Nutrition & Weight - Outcomes:  Pre Biometrics - 12/11/23 0917       Pre Biometrics   Height 6' 1 (1.854 m)    Weight 98.3 kg    Waist Circumference 40 inches    Hip Circumference 41.5 inches    Waist to Hip Ratio 0.96 %    BMI (Calculated) 28.6    Grip Strength 39.7 kg    Single Leg Stand 30 seconds          Post Biometrics - 02/14/24 0943        Post  Biometrics   Height 6' 1 (1.854 m)    Weight 97.7 kg    Waist Circumference 10 inches    Hip Circumference 40 inches    Waist to Hip Ratio 0.25 %    BMI (Calculated) 28.42    Grip Strength 39.7 kg    Single Leg Stand 30 seconds         Goals reviewed with patient; copy given to patient.

## 2024-02-16 ENCOUNTER — Encounter (HOSPITAL_COMMUNITY)
Admission: RE | Admit: 2024-02-16 | Discharge: 2024-02-16 | Disposition: A | Discharge status: Home / Self Care | Source: Ambulatory Visit | Attending: Internal Medicine | Admitting: Internal Medicine

## 2024-02-16 DIAGNOSIS — I5022 Chronic systolic (congestive) heart failure: Secondary | ICD-10-CM | POA: Diagnosis not present

## 2024-02-16 NOTE — Progress Notes (Signed)
 Daily Session Note  Patient Details  Name: Stephen Macdonald MRN: [id number] Date of Birth: 06/01/1945 Referring Provider:   Flowsheet Row CARDIAC REHAB PHASE II ORIENTATION from 12/11/2023 in [facility]  Referring Provider Tobie Pamella Guadalajara MD    Encounter Date: 02/16/2024  Check In:  Session Check In - 02/16/24 0924       Check-In   Supervising physician immediately available to respond to emergencies See telemetry face sheet for immediately available MD    Location [facility]    Staff Present Ronal Idell Glen, RN, BSN, Randie Gelineau, MA, RCEP, CCRP, CCET    Virtual Visit No    Medication changes reported     No    Fall or balance concerns reported    No    Tobacco Cessation No Change    Warm-up and Cool-down Performed on first and last piece of equipment    Resistance Training Performed Yes    VAD Patient? No    PAD/SET Patient? No      Pain Assessment   Currently in Pain? No/denies          Capillary Blood Glucose: No results found for this or any previous visit (from the past 24 hours).    Social History   Tobacco Use  Smoking Status Some Days   Types: Cigars, Cigarettes  Smokeless Tobacco Former   Types: Snuff  Tobacco Comments   Discussed cessation and shared we have classes that he could attend.     Goals Met:  Independence with exercise equipment Exercise tolerated well No report of concerns or symptoms today Strength training completed today  Goals Unmet:  Not Applicable  Comments: Pt able to follow exercise prescription today without complaint.  Will continue to monitor for progression.

## 2024-02-19 ENCOUNTER — Encounter (HOSPITAL_COMMUNITY)
Admission: RE | Admit: 2024-02-19 | Discharge: 2024-02-19 | Disposition: A | Discharge status: Home / Self Care | Source: Ambulatory Visit | Attending: Internal Medicine | Admitting: Internal Medicine

## 2024-02-19 DIAGNOSIS — I5022 Chronic systolic (congestive) heart failure: Secondary | ICD-10-CM | POA: Insufficient documentation

## 2024-02-19 NOTE — Progress Notes (Signed)
 Daily Session Note  Patient Details  Name: Stephen Macdonald MRN: [id number] Date of Birth: Jul 12, 1945 Referring Provider:   Flowsheet Row CARDIAC REHAB PHASE II ORIENTATION from 12/11/2023 in [facility] CARDIAC REHABILITATION  Referring Provider Tobie Pamella Guadalajara MD    Encounter Date: 02/19/2024  Check In:  Session Check In - 02/19/24 9061       Check-In   Supervising physician immediately available to respond to emergencies See telemetry face sheet for immediately available MD    Location [facility]    Staff Present Powell Benders, BS, Exercise Physiologist;Jessica Vonzell, MA, RCEP, CCRP, CCET;Jakiah Bienaime Jackquline, BSN, RN, WTA-C    Virtual Visit No    Medication changes reported     No    Fall or balance concerns reported    No    Tobacco Cessation No Change    Warm-up and Cool-down Performed on first and last piece of equipment    Resistance Training Performed Yes    VAD Patient? No    PAD/SET Patient? No      Pain Assessment   Currently in Pain? No/denies          Capillary Blood Glucose: No results found for this or any previous visit (from the past 24 hours).    Social History   Tobacco Use  Smoking Status Some Days   Types: Cigars, Cigarettes  Smokeless Tobacco Former   Types: Snuff  Tobacco Comments   Discussed cessation and shared we have classes that he could attend.     Goals Met:  Independence with exercise equipment Exercise tolerated well No report of concerns or symptoms today Strength training completed today  Goals Unmet:  Not Applicable  Comments: Pt able to follow exercise prescription today without complaint.  Will continue to monitor for progression.

## 2024-02-21 ENCOUNTER — Encounter (HOSPITAL_COMMUNITY): Admission: RE | Admit: 2024-02-21 | Source: Ambulatory Visit

## 2024-02-21 ENCOUNTER — Encounter (HOSPITAL_COMMUNITY): Payer: Self-pay

## 2024-02-21 DIAGNOSIS — I5022 Chronic systolic (congestive) heart failure: Secondary | ICD-10-CM

## 2024-02-21 NOTE — Progress Notes (Signed)
 Cardiac Individual Treatment Plan  Patient Details  Name: Stephen Macdonald MRN: [id number] Date of Birth: [birth year] Referring Provider:   Flowsheet Row CARDIAC REHAB PHASE II ORIENTATION from 12/11/2023 in [facility] CARDIAC REHABILITATION  Referring Provider Tobie Pamella Guadalajara MD    Initial Encounter Date:  Flowsheet Row CARDIAC REHAB PHASE II ORIENTATION from 12/11/2023 in [place] [facility]ARDIAC RE[facility]ATION  Date 12/11/23    Visit Diagnosis: Heart failure, chronic systolic (HCC)  Patient's Home Medications on Admission:  Current Outpatient Medications:    acetaminophen  (TYLENOL ) 500 MG tablet, Take 2 tablets (1,000 mg total) by mouth every 6 (six) hours as needed for mild pain., Disp: , Rfl:    apixaban (ELIQUIS) 5 MG TABS tablet, Take 5 mg by mouth 2 (two) times daily., Disp: , Rfl:    atorvastatin  (LIPITOR ) 80 MG tablet, Take 1 tablet (80 mg total) by mouth daily., Disp: 30 tablet, Rfl: 11   benzonatate  (TESSALON ) 100 MG capsule, Take 1 capsule (100 mg total) by mouth every 8 (eight) hours. (Patient not taking: Reported on 12/11/2023), Disp: 30 capsule, Rfl: 0   cholecalciferol  (VITAMIN D ) 1000 UNITS tablet, Take 1,000 Units by mouth at bedtime. , Disp: , Rfl:    clopidogrel  (PLAVIX ) 75 MG tablet, Take 1 tablet (75 mg total) by mouth daily. Start on Sunday 11/11/18 (Patient not taking: Reported on 12/11/2023), Disp: 30 tablet, Rfl: 2   empagliflozin (JARDIANCE) 25 MG TABS tablet, Take 25 mg by mouth daily., Disp: , Rfl:    ezetimibe  (ZETIA ) 10 MG tablet, Take 10 mg by mouth daily., Disp: , Rfl:    finasteride (PROSCAR) 5 MG tablet, Take 5 mg by mouth daily., Disp: , Rfl:    furosemide  (LASIX ) 40 MG tablet, Take 0.5 tablets (20 mg total) by mouth daily. Start after BMP test recheck on Monday 11/12/18 (Patient not taking: Reported on 12/11/2023), Disp: 30 tablet, Rfl: 1   metoprolol  succinate (TOPROL -XL) 25 MG 24 hr tablet, Take 25 mg by mouth every evening. , Disp: , Rfl:    nitroGLYCERIN   (NITROSTAT ) 0.4 MG SL tablet, Place 1 tablet (0.4 mg total) under the tongue every 5 (five) minutes as needed for chest pain., Disp: 25 tablet, Rfl: 3   ondansetron  (ZOFRAN ) 4 MG tablet, Take 1 tablet (4 mg total) by mouth every 6 (six) hours as needed for nausea., Disp: 14 tablet, Rfl: 0   oxyCODONE  (OXY IR/ROXICODONE ) 5 MG immediate release tablet, Take 1 tablet (5 mg total) by mouth every 6 (six) hours as needed for severe pain. (Patient not taking: Reported on 12/11/2023), Disp: 15 tablet, Rfl: 0   spironolactone (ALDACTONE) 25 MG tablet, Take 25 mg by mouth 2 (two) times daily. (Patient not taking: Reported on 12/11/2023), Disp: , Rfl:    tamsulosin  (FLOMAX ) 0.4 MG CAPS capsule, Take 0.4 mg by mouth every morning. (Patient not taking: Reported on 12/11/2023), Disp: , Rfl:    valsartan  (DIOVAN ) 40 MG tablet, Take 1 tablet (40 mg total) by mouth at bedtime. Start after BMP test recheck on Monday 11/12/18 (Patient taking differently: Take 20 mg by mouth at bedtime.), Disp: 30 tablet, Rfl: 1  Past Medical History: Past Medical History:  Diagnosis Date   Chronic combined systolic and diastolic CHF, NYHA class 3 (HCC)    a. 01/2015 Echo: EF 35-30%; b. 03/2016 Echo: Ef 20%, Gr1DD, mid-apical anterior, mid anteroseptal, apical inferior, basal inferolateral, mid anterolateral, and apical AK, basal anteroseptal and mid inferior severe HK, midlly dil LA, mild TR.  CKD (chronic kidney disease), stage III (HCC) 04/15/2016   Coronary artery disease    a. 1997 s/p CABG;  b. 01/2015 MI/PCI: G->PDA;  c. 03/2016 NSTEMI/PCI: LM 20, LAD 17m, D2 80, RI 60, LCX ok, OM1 90, RCA 60p, 11m, RPAV 80, G->RPDA 15p ISR, 90d (3.5x28 Promus Premier DES), LIMA->OM1->dLAD nl.   High cholesterol    Hypertension    Ischemic cardiomyopathy    a. 01/2015 Echo: EF 25-30%; b. s/p AICD;  c. 03/2016 Echo: Ef 20%, Gr1DD.   Smoker     Tobacco Use: Social History   Tobacco Use  Smoking Status Some Days   Types: Cigars, Cigarettes   Smokeless Tobacco Former   Types: Snuff  Tobacco Comments   Discussed cessation and shared we have classes that he could attend.     Labs: Review Flowsheet       Latest Ref Rng & Units 02/12/2015 04/16/2016  Labs for ITP Cardiac and Pulmonary Rehab  Cholestrol 0 - 200 mg/dL - 842   LDL (calc) 0 - 99 mg/dL - 95   HDL-C >59 mg/dL - 43   Trlycerides <849 mg/dL - 96   Hemoglobin J8r 4.8 - 5.6 % - 5.6   TCO2 0 - 100 mmol/L 18  -     Exercise Target Goals: Exercise Program Goal: Individual exercise prescription set using results from initial 6 min walk test and THRR while considering  patient's activity barriers and safety.   Exercise Prescription Goal: Initial exercise prescription builds to 30-45 minutes a day of aerobic activity, 2-3 days per week.  Home exercise guidelines will be given to patient during program as part of exercise prescription that the participant will acknowledge.   Education: Aerobic Exercise: - Group verbal and visual presentation on the components of exercise prescription. Introduces F.I.T.T principle from ACSM for exercise prescriptions.  Reviews F.I.T.T. principles of aerobic exercise including progression. Written material provided at class time. Flowsheet Row CARDIAC REHAB PHASE II EXERCISE from 02/14/2024 in [place] CARDIAC RE[facility]  Date 12/13/23  Educator jh  Instruction Review Code 2- Demonstrated Understanding    Education: Resistance Exercise: - Group verbal and visual presentation on the components of exercise prescription. Introduces F.I.T.T principle from ACSM for exercise prescriptions  Reviews F.I.T.T. principles of resistance exercise including progression. Written material provided at class time. Flowsheet Row CARDIAC REHAB PHASE II EXERCISE from 02/14/2024 in [place] CARDIAC REH[facility]ILITATION  Date 12/20/23  Educator jh  Instruction Review Code 2- Demonstrated Understanding     Education: Exercise & Equipment Safety: -  Individual verbal instruction and demonstration of equipment use and safety with use of the equipment.   Education: Exercise Physiology & General Exercise Guidelines: - Group verbal and written instruction with models to review the exercise physiology of the cardiovascular system and associated critical values. Provides general exercise guidelines with specific guidelines to those with heart or lung disease. Written material provided at class time. Flowsheet Row CARDIAC REHAB PHASE II EXERCISE from 02/14/2024 in [place] CARDIAC REH[facility]ILITATION  Date 02/07/24  Educator [facility]  Instruction Review Code 1- Verbalizes Understanding    Education: Flexibility, Balance, Mind/Body Relaxation: - Group verbal and visual presentation with interactive activity on the components of exercise prescription. Introduces F.I.T.T principle from ACSM for exercise prescriptions. Reviews F.I.T.T. principles of flexibility and balance exercise training including progression. Also discusses the mind body connection.  Reviews various relaxation techniques to help reduce and manage stress (i.e. Deep breathing, progressive muscle relaxation, and visualization). Balance handout provided to  take home. Written material provided at class time. Flowsheet Row CARDIAC REHAB PHASE II EXERCISE from 02/14/2024 in [place] CARDIAC RE[facility]ATION  Date 12/20/23  Educator jh  Instruction Review Code 2- Demonstrated Understanding    Activity Barriers & Risk Stratification:  Activity Barriers & Cardiac Risk Stratification - 12/08/23 1448       Activity Barriers & Cardiac Risk Stratification   Activity Barriers None    Cardiac Risk Stratification High          6 Minute Walk:  6 Minute Walk     Row Name 12/11/23 0914 02/14/24 0942       6 Minute Walk   Phase Initial Discharge    Distance 1505 feet 1650 feet    Distance Feet Change -- 142 ft    Walk Time 6 minutes 6 minutes    # of Rest Breaks 0 0    MPH 2.85 3.12     METS 3.09 3.03    RPE 11 8    Perceived Dyspnea  -- 0    VO2 Peak 10.83 10.6    Symptoms Yes (comment) No    Comments chronic back pain 2/10 --    Resting HR 62 bpm 61 bpm    Resting BP 96/70 100/60    Resting Oxygen Saturation  97 % 98 %    Exercise Oxygen Saturation  during 6 min walk 96 % 98 %    Max Ex. HR 99 bpm 101 bpm    Max Ex. BP 166/74 120/60    2 Minute Post BP 132/72 110/60       Oxygen Initial Assessment:   Oxygen Re-Evaluation:   Oxygen Discharge (Final Oxygen Re-Evaluation):   Initial Exercise Prescription:  Initial Exercise Prescription - 12/11/23 0900       Date of Initial Exercise RX and Referring Provider   Date 12/11/23    Referring Provider Tobie Pamella Guadalajara MD      Oxygen   Maintain Oxygen Saturation 88% or higher      Treadmill   MPH 2.7    Grade 1    Minutes 15    METs 3.44      REL-XR   Level 3    Speed 50    Minutes 15    METs 3      Prescription Details   Frequency (times per week) 3    Duration Progress to 30 minutes of continuous aerobic without signs/symptoms of physical distress      Intensity   THRR 40-80% of Max Heartrate 94-126    Ratings of Perceived Exertion 11-13    Perceived Dyspnea 0-4      Progression   Progression Continue to progress workloads to maintain intensity without signs/symptoms of physical distress.      Resistance Training   Training Prescription Yes    Weight 4 lb    Reps 10-15          Perform Capillary Blood Glucose checks as needed.  Exercise Prescription Changes:   Exercise Prescription Changes     Row Name 12/11/23 0900 01/01/24 1300 01/22/24 1300 02/05/24 1500       Response to Exercise   Blood Pressure (Admit) 96/70 108/66 134/70 120/64    Blood Pressure (Exercise) 166/74 -- -- --    Blood Pressure (Exit) 132/72 100/62 100/62 106/50    Heart Rate (Admit) 62 bpm 70 bpm 65 bpm 63 bpm    Heart Rate (Exercise) 99 bpm 113 bpm 120 bpm 104  bpm    Heart Rate (Exit) 64 bpm 75 bpm  88 bpm 78 bpm    Oxygen Saturation (Admit) 97 % -- -- --    Oxygen Saturation (Exercise) 96 % -- -- --    Rating of Perceived Exertion (Exercise) 11 10 11 10     Symptoms chronic back pain 2/10 -- -- --    Comments walk test results -- -- --    Duration -- Continue with 30 min of aerobic exercise without signs/symptoms of physical distress. Continue with 30 min of aerobic exercise without signs/symptoms of physical distress. Continue with 30 min of aerobic exercise without signs/symptoms of physical distress.    Intensity -- THRR unchanged THRR unchanged THRR unchanged      Progression   Progression -- Continue to progress workloads to maintain intensity without signs/symptoms of physical distress. Continue to progress workloads to maintain intensity without signs/symptoms of physical distress. Continue to progress workloads to maintain intensity without signs/symptoms of physical distress.      Resistance Training   Training Prescription -- Yes Yes Yes    Weight -- 4 4 4     Reps -- 10-15 10-15 10-15      Treadmill   MPH -- 3 3.3 3.3    Grade -- 1.5 2.5 2    Minutes -- 15 15 15     METs -- 3.92 4.66 4.43      REL-XR   Level -- 4 5 5     Speed -- 48 56 49    Minutes -- 15 15 15     METs -- 3.2 4.2 6.6       Exercise Comments:   Exercise Comments     Row Name 12/13/23 1036           Exercise Comments First full day of exercise!  Patient was oriented to gym and equipment including functions, settings, policies, and procedures.  Patient's individual exercise prescription and treatment plan were reviewed.  All starting workloads were established based on the results of the 6 minute walk test done at initial orientation visit.  The plan for exercise progression was also introduced and progression will be customized based on patient's performance and goals.          Exercise Goals and Review:   Exercise Goals     Row Name 12/11/23 [phone]             Exercise Goals   Increase  Physical Activity Yes       Intervention Provide advice, education, support and counseling about physical activity/exercise needs.;Develop an individualized exercise prescription for aerobic and resistive training based on initial evaluation findings, risk stratification, comorbidities and participant's personal goals.       Expected Outcomes Short Term: Attend rehab on a regular basis to increase amount of physical activity.;Long Term: Exercising regularly at least 3-5 days a week.;Long Term: Add in home exercise to make exercise part of routine and to increase amount of physical activity.       Increase Strength and Stamina Yes       Intervention Develop an individualized exercise prescription for aerobic and resistive training based on initial evaluation findings, risk stratification, comorbidities and participant's personal goals.;Provide advice, education, support and counseling about physical activity/exercise needs.       Expected Outcomes Short Term: Increase workloads from initial exercise prescription for resistance, speed, and METs.;Short Term: Perform resistance training exercises routinely during rehab and add in resistance training at home;Long Term: Improve cardiorespiratory fitness, muscular endurance  and strength as measured by increased METs and functional capacity ( )       Able to understand and use rate of perceived exertion (RPE) scale Yes       Intervention Provide education and explanation on how to use RPE scale       Expected Outcomes Short Term: Able to use RPE daily in rehab to express subjective intensity level;Long Term:  Able to use RPE to guide intensity level when exercising independently       Able to understand and use Dyspnea scale Yes       Intervention Provide education and explanation on how to use Dyspnea scale       Expected Outcomes Short Term: Able to use Dyspnea scale daily in rehab to express subjective sense of shortness of breath during exertion;Long Term:  Able to use Dyspnea scale to guide intensity level when exercising independently       Knowledge and understanding of Target Heart Rate Range (THRR) Yes       Intervention Provide education and explanation of THRR including how the numbers were predicted and where they are located for reference       Expected Outcomes Short Term: Able to state/look up THRR;Long Term: Able to use THRR to govern intensity when exercising independently;Short Term: Able to use daily as guideline for intensity in rehab       Able to check pulse independently Yes       Intervention Provide education and demonstration on how to check pulse in carotid and radial arteries.;Review the importance of being able to check your own pulse for safety during independent exercise       Expected Outcomes Short Term: Able to explain why pulse checking is important during independent exercise;Long Term: Able to check pulse independently and accurately       Understanding of Exercise Prescription Yes       Intervention Provide education, explanation, and written materials on patient's individual exercise prescription       Expected Outcomes Short Term: Able to explain program exercise prescription;Long Term: Able to explain home exercise prescription to exercise independently          Exercise Goals Re-Evaluation :  Exercise Goals Re-Evaluation     Row Name 12/13/23 1036 01/01/24 1330 01/12/24 0939 01/24/24 1323 02/06/24 0846     Exercise Goal Re-Evaluation   Exercise Goals Review Able to understand and use rate of perceived exertion (RPE) scale;Knowledge and understanding of Target Heart Rate Range (THRR) Increase Physical Activity;Increase Strength and Stamina;Understanding of Exercise Prescription Increase Physical Activity;Increase Strength and Stamina;Understanding of Exercise Prescription Increase Physical Activity;Increase Strength and Stamina;Understanding of Exercise Prescription Increase Physical Activity;Increase Strength and  Stamina;Understanding of Exercise Prescription   Comments Reviewed RPE and dyspnea scale, THR and program prescription with pt today.  Pt voiced understanding and was given a copy of goals to take home. Burman is doing well in rehab. He has completed 11 sessions of CR. He has increased his walking speed on the treadmill to 3.0 with a grade of 1.5. Will continue to monitor and progress as able. Tyreck is doing well in rehab. He stated that he does enjoy coming to class and feels good after exercise. He is doing resistance bands at home but is not  walking . He has noticed that he is able to do stairs easier then before the start of the program. Laban has completed 23 sessions of CR. He is increasing his MPH on the treadmill to 3.3  with a grade of 2.5 and feeling good this. He continues to exericse at home but has not started to walk. Will continue to monitor and progress as able. Johnnathan has completed 28 sessions of CR. He continue to do well in the program and increasing his levels to push himself. Will continue to monitor and progress as able.   Expected Outcomes Short: Use RPE daily to regulate intensity.  Long: Follow program prescription in THR. Short: increase levels when able   long: continue to exercsie Short: add walking at homw into week   long: continues to attend rehab Short: continue to increase levels when able   long: contineue to attned rehab Short: comtimue to exercise and push himself   long: continue to attend rehab    Row Name 02/14/24 0944             Exercise Goal Re-Evaluation   Exercise Goals Review Increase Physical Activity;Increase Strength and Stamina;Able to understand and use Dyspnea scale       Comments Jorian is doing well in rehab. He is about to finish the program in the next week. He is pushing himself in class and exerciseing oputside class       Expected Outcomes Short: comtimue to exercise and push himself   long: continue to attend rehab          Discharge Exercise  Prescription (Final Exercise Prescription Changes):  Exercise Prescription Changes - 02/05/24 1500       Response to Exercise   Blood Pressure (Admit) 120/64    Blood Pressure (Exit) 106/50    Heart Rate (Admit) 63 bpm    Heart Rate (Exercise) 104 bpm    Heart Rate (Exit) 78 bpm    Rating of Perceived Exertion (Exercise) 10    Duration Continue with 30 min of aerobic exercise without signs/symptoms of physical distress.    Intensity THRR unchanged      Progression   Progression Continue to progress workloads to maintain intensity without signs/symptoms of physical distress.      Resistance Training   Training Prescription Yes    Weight 4    Reps 10-15      Treadmill   MPH 3.3    Grade 2    Minutes 15    METs 4.43      REL-XR   Level 5    Speed 49    Minutes 15    METs 6.6          Nutrition:  Target Goals: Understanding of nutrition guidelines, daily intake of sodium 1500mg , cholesterol 200mg , calories 30% from fat and 7% or less from saturated fats, daily to have 5 or more servings of fruits and vegetables.  Education: Nutrition 1 -Group instruction provided by verbal, written material, interactive activities, discussions, models, and posters to present general guidelines for heart healthy nutrition including macronutrients, label reading, and promoting whole foods over processed counterparts. Education serves as pensions consultant of discussion of heart healthy eating for all. Written material provided at class time. Flowsheet Row CARDIAC REHAB PHASE II EXERCISE from 02/14/2024 in [place] [facility]IT[facility]  Date 12/27/23  Educator DJ  Instruction Review Code 1- Verbalizes Understanding     Education: Nutrition 2 -Group instruction provided by verbal, written material, interactive activities, discussions, models, and posters to present general guidelines for heart healthy nutrition including sodium, cholesterol, and saturated fat. Providing guidance of habit  forming to improve blood pressure, cholesterol, and body weight. Written material provided at class time.  Flowsheet Row CARDIAC REHAB PHASE II EXERCISE from 02/14/2024 in [place] [facility]  Date 12/27/23  Educator DJ  Instruction Review Code 1- Verbalizes Understanding      Biometrics:  Pre Biometrics - 12/11/23 0917       Pre Biometrics   Height 6' 1 (1.854 m)    Weight 216 lb 11.2 oz (98.3 kg)    Waist Circumference 40 inches    Hip Circumference 41.5 inches    Waist to Hip Ratio 0.96 %    BMI (Calculated) 28.6    Grip Strength 39.7 kg    Single Leg Stand 30 seconds          Post Biometrics - 02/14/24 0943        Post  Biometrics   Height 6' 1 (1.854 m)    Weight 215 lb 6.2 oz (97.7 kg)    Waist Circumference 10 inches    Hip Circumference 40 inches    Waist to Hip Ratio 0.25 %    BMI (Calculated) 28.42    Grip Strength 39.7 kg    Single Leg Stand 30 seconds          Nutrition Therapy Plan and Nutrition Goals:   Nutrition Assessments:  MEDIFICTS Score Key: >=70 Need to make dietary changes  40-70 Heart Healthy Diet <= 40 Therapeutic Level Cholesterol Diet  Flowsheet Row CARDIAC REHAB PHASE II EXERCISE from 02/19/2024 in [facility] CARDIAC REHABILITATION  Picture Your Plate Total Score on Admission 30  Picture Your Plate Total Score on Discharge 65   Picture Your Plate Scores: <59 Unhealthy dietary pattern with much room for improvement. 41-50 Dietary pattern unlikely to meet recommendations for good health and room for improvement. 51-60 More healthful dietary pattern, with some room for improvement.  >60 Healthy dietary pattern, although there may be some specific behaviors that could be improved.    Nutrition Goals Re-Evaluation:  Nutrition Goals Re-Evaluation     Row Name 01/12/24 0945 02/14/24 0947           Goals   Nutrition Goal healthy eating healthy eating      Comment Jahkari is doing well in rehab. He is trying to  eat healthier meals. He is eating mostly chicken that is baked but will fry it sometimes. He is eatting lots of veggies and loves apples and small oranges Cuties. His A1C was on the higher end 6 months ago so he has made a lifestyle change cutting out white bread, sweet tea and other sweets to help lower A1C and prevent being diabetic. He is not much of a salt eater and does not eat things lilke chips. Abrar has been doihng well in class and will graduate soon. He continues to eat  well and picking healthy optoioins for meals. He is continuing to watch his sugar intake to help lower his A1C      Expected Outcome Short: try to cut down on fried chiecken  long: continue to focus on healthy eating Short: try to cut down on fried chiecken  long: continue to focus on healthy eating         Nutrition Goals Discharge (Final Nutrition Goals Re-Evaluation):  Nutrition Goals Re-Evaluation - 02/14/24 0947       Goals   Nutrition Goal healthy eating    Comment Jazier has been doihng well in class and will graduate soon. He continues to eat  well and picking healthy optoioins for meals. He is continuing to watch his sugar intake  to help lower his A1C    Expected Outcome Short: try to cut down on fried chiecken  long: continue to focus on healthy eating          Psychosocial: Target Goals: Acknowledge presence or absence of significant depression and/or stress, maximize coping skills, provide positive support system. Participant is able to verbalize types and ability to use techniques and skills needed for reducing stress and depression.   Education: Stress, Anxiety, and Depression - Group verbal and visual presentation to define topics covered.  Reviews how body is impacted by stress, anxiety, and depression.  Also discusses healthy ways to reduce stress and to treat/manage anxiety and depression. Written material provided at class time. Flowsheet Row CARDIAC REHAB PHASE II EXERCISE from 02/14/2024 in  [place] [facility]  Date 01/03/24  Educator HB  Instruction Review Code 1- [employer] Understanding    Education: Sleep Hygiene -Provides group verbal and written instruction about how sleep can affect your health.  Define sleep hygiene, discuss sleep cycles and impact of sleep habits. Review good sleep hygiene tips. Flowsheet Row CARDIAC REHAB PHASE II EXERCISE from 02/14/2024 in [place] [facility]  Date 01/03/24  Educator HB  Instruction Review Code 1- Verbalizes Understanding    Initial Review & Psychosocial Screening:  Initial Psych Review & Screening - 12/08/23 1458       Initial Review   Current issues with None Identified      Family Dynamics   Good Support System? Yes    Comments Patient's wife and 2 children support him.      Barriers   Psychosocial barriers to participate in program The patient should benefit from training in stress management and relaxation.;There are no identifiable barriers or psychosocial needs.      Screening Interventions   Interventions To provide support and resources with identified psychosocial needs;Encouraged to exercise;Program counselor consult;Provide feedback about the scores to participant    Expected Outcomes Short Term goal: Utilizing psychosocial counselor, staff and physician to assist with identification of specific Stressors or current issues interfering with healing process. Setting desired goal for each stressor or current issue identified.;Long Term Goal: Stressors or current issues are controlled or eliminated.;Short Term goal: Identification and review with participant of any Quality of Life or Depression concerns found by scoring the questionnaire.;Long Term goal: The participant improves quality of Life and PHQ9 Scores as seen by post scores and/or verbalization of changes          Quality of Life Scores:   Quality of Life - 02/19/24 0942       Quality of Life   Select Quality of Life       Quality of Life Scores   Health/Function Pre 21.83 %    Health/Function Post 22.6 %    Health/Function % Change 3.53 %    Socioeconomic Pre 25.71 %    Socioeconomic Post 23.21 %    Socioeconomic % Change  -9.72 %    Psych/Spiritual Pre 20.79 %    Psych/Spiritual Post 26.07 %    Psych/Spiritual % Change 25.4 %    Family Pre 28.8 %    Family Post 27.7 %    Family % Change -3.82 %    GLOBAL Pre 23.44 %    GLOBAL Post 24.13 %    GLOBAL % Change 2.94 %         Scores of 19 and below usually indicate a poorer quality of life in these areas.  A difference of  2-3 points is a clinically meaningful difference.  A difference of 2-3 points in the total score of the Quality of Life Index has been associated with significant improvement in overall quality of life, self-image, physical symptoms, and general health in studies assessing change in quality of life.  PHQ-9: Review Flowsheet  More data exists      02/19/2024 12/11/2023 11/02/2016 07/21/2016 11/05/2015  Depression screen PHQ 2/9  Decreased Interest 1 1 1 1 1   Down, Depressed, Hopeless 0 0 0 0 0  PHQ - 2 Score 1 1 1 1 1   Altered sleeping 1 0 1 0 0  Tired, decreased energy 2 1 2 2 1   Change in appetite 0 0 0 0 0  Feeling bad or failure about yourself  0 0 0 0 0  Trouble concentrating 0 0 0 0 0  Moving slowly or fidgety/restless 0 0 0 0 0  Suicidal thoughts 0 0 0 0  0   PHQ-9 Score 4 2 4 3 2   Difficult doing work/chores Somewhat difficult Not difficult at all Somewhat difficult Somewhat difficult Somewhat difficult    Details       Data saved with a previous flowsheet row definition        Interpretation of Total Score  Total Score Depression Severity:  1-4 = Minimal depression, 5-9 = Mild depression, 10-14 = Moderate depression, 15-19 = Moderately severe depression, 20-27 = Severe depression   Psychosocial Evaluation and Intervention:  Psychosocial Evaluation - 12/08/23 1458       Psychosocial Evaluation &  Interventions   Interventions Encouraged to exercise with the program and follow exercise prescription;Relaxation education;Stress management education    Comments Patient was referred to cardiac rehab from the [facility] with chronic systolic HF. He has completed the program in 2018 here at [facility]. He denies any depression, anxiety or current stressors. He says he sleeps well. He has OSA but does not use CPAP. 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 He is currently smoking 10 cigars/week and has no interest in quitting at this time. He enjoys playing the guitar and spending time with his grandchildren. His goals for the program are to increase his energy; get stronger; and get back to doing his normal activities. He has no barriers identified to complete the program.    Expected Outcomes Short Term: Patient will start the program and attend consistently. Long Term: Patient will complete the program meeting personal goals.    Continue Psychosocial Services  Follow up required by staff          Psychosocial Re-Evaluation:  Psychosocial Re-Evaluation     Row Name 01/12/24 0941 02/14/24 0945           Psychosocial Re-Evaluation   Current issues with None Identified None Identified      Comments Sony stated that he has been feeling good and does not have any stress or sleeping issues. He does sleep 7-8 hours a night with getting up once but is able to go back to bed. Lamontae is about to finish the program in the next week. He has done well and enjoyed coming. He contiues to sleep well and have no major stressors      Expected Outcomes Short: continue to have no issees    long: continuet  to exericse for overall happiness Short: continue to have no stressors    long: continue to exercise for overall well beoing  Interventions Encouraged to attend Cardiac Rehabilitation for the exercise Encouraged to attend Cardiac Rehabilitation for the exercise       Continue Psychosocial Services  Follow up required by staff Follow up required by staff         Psychosocial Discharge (Final Psychosocial Re-Evaluation):  Psychosocial Re-Evaluation - 02/14/24 0945       Psychosocial Re-Evaluation   Current issues with None Identified    Comments Leaman is about to finish the program in the next week. He has done well and enjoyed coming. He contiues to sleep well and have no major stressors    Expected Outcomes Short: continue to have no stressors    long: continue to exercise for overall well beoing    Interventions Encouraged to attend Cardiac Rehabilitation for the exercise    Continue Psychosocial Services  Follow up required by staff          Vocational Rehabilitation: Provide vocational rehab assistance to qualifying candidates.   Vocational Rehab Evaluation & Intervention:  Vocational Rehab - 12/08/23 1436       Initial Vocational Rehab Evaluation & Intervention   Assessment shows need for Vocational Rehabilitation No          Education: Education Goals: Education classes will be provided on a variety of topics geared toward better understanding of heart health and risk factor modification. Participant will state understanding/return demonstration of topics presented as noted by education test scores.  Learning Barriers/Preferences:  Learning Barriers/Preferences - 12/08/23 1436       Learning Barriers/Preferences   Learning Barriers None    Learning Preferences Skilled Demonstration;Individual Instruction;Group Instruction          General Cardiac Education Topics:  AED/CPR: - Group verbal and written instruction with the use of models to demonstrate the basic use of the AED with the basic ABC's of resuscitation.   Test and Procedures: - Group verbal and visual presentation and models provide information about basic cardiac anatomy and function. Reviews the testing methods done to diagnose heart disease and the  outcomes of the test results. Describes the treatment choices: Medical Management, Angioplasty, or Coronary Bypass Surgery for treating various heart conditions including Myocardial Infarction, Angina, Valve Disease, and Cardiac Arrhythmias. Written material provided at class time. Flowsheet Row CARDIAC REHAB PHASE II EXERCISE from 02/14/2024 in [place] [facility]  Date 01/31/24  Educator jh  Instruction Review Code 2- Demonstrated Understanding    Medication Safety: - Group verbal and visual instruction to review commonly prescribed medications for heart and lung disease. Reviews the medication, class of the drug, and side effects. Includes the steps to properly store meds and maintain the prescription regimen. Written material provided at class time. Flowsheet Row CARDIAC REHAB PHASE II EXERCISE from 02/14/2024 in [place] [facility]  Date 01/17/24  Educator [facility]  Instruction Review Code 1- Verbalizes Understanding    Intimacy: - Group verbal instruction through game format to discuss how heart and lung disease can affect sexual intimacy. Written material provided at class time. Flowsheet Row CARDIAC REHAB PHASE II EXERCISE from 02/14/2024 in [place] [facility]  Date 12/13/23  Educator jh  Instruction Review Code 2- Demonstrated Understanding    Know Your Numbers and Heart Failure: - Group verbal and visual instruction to discuss disease risk factors for cardiac and pulmonary disease and treatment options.  Reviews associated critical values for Overweight/Obesity, Hypertension, Cholesterol, and Diabetes.  Discusses basics of heart failure: signs/symptoms and treatments.  Introduces Heart Failure Zone chart  for action plan for heart failure. Written material provided at class time. Flowsheet Row CARDIAC REHAB PHASE II EXERCISE from 02/14/2024 in [place] [facility]  Instruction Review Code 1- Verbalizes Understanding    Infection  Prevention: - Provides verbal and written material to individual with discussion of infection control including proper hand washing and proper equipment cleaning during exercise session.   Falls Prevention: - Provides verbal and written material to individual with discussion of falls prevention and safety.   Other: -Provides group and verbal instruction on various topics (see comments)   Knowledge Questionnaire Score:  Knowledge Questionnaire Score - 02/19/24 0943       Knowledge Questionnaire Score   Pre Score 20/26    Post Score 22/24          Core Components/Risk Factors/Patient Goals at Admission:  Personal Goals and Risk Factors at Admission - 12/11/23 0918       Core Components/Risk Factors/Patient Goals on Admission    Weight Management Yes;Weight Loss    Intervention Weight Management: Develop a combined nutrition and exercise program designed to reach desired caloric intake, while maintaining appropriate intake of nutrient and fiber, sodium and fats, and appropriate energy expenditure required for the weight goal.;Weight Management: Provide education and appropriate resources to help participant work on and attain dietary goals.;Weight Management/Obesity: Establish reasonable short term and long term weight goals.    Admit Weight 216 lb 11.2 oz (98.3 kg)    Goal Weight: Short Term 211 lb (95.7 kg)    Goal Weight: Long Term 200 lb (90.7 kg)    Expected Outcomes Short Term: Continue to assess and modify interventions until short term weight is achieved;Long Term: Adherence to nutrition and physical activity/exercise program aimed toward attainment of established weight goal;Weight Loss: Understanding of general recommendations for a balanced deficit meal plan, which promotes 1-2 lb weight loss per week and includes a negative energy balance of [phone] kcal/d;Understanding recommendations for meals to include 15-35% energy as protein, 25-35% energy from fat, 35-60% energy  from carbohydrates, less than 200mg  of dietary cholesterol, 20-35 gm of total fiber daily;Understanding of distribution of calorie intake throughout the day with the consumption of 4-5 meals/snacks    Tobacco Cessation Yes    Intervention Assist the participant in steps to quit. Provide individualized education and counseling about committing to Tobacco Cessation, relapse prevention, and pharmacological support that can be provided by physician.;Education officer, environmental, assist with locating and accessing local/national Quit Smoking programs, and support quit date choice.    Expected Outcomes Short Term: Will demonstrate readiness to quit, by selecting a quit date.;Long Term: Complete abstinence from all tobacco products for at least 12 months from quit date.;Short Term: Will quit all tobacco product use, adhering to prevention of relapse plan.    Heart Failure Yes    Intervention Provide a combined exercise and nutrition program that is supplemented with education, support and counseling about heart failure. Directed toward relieving symptoms such as shortness of breath, decreased exercise tolerance, and extremity edema.    Expected Outcomes Improve functional capacity of life;Short term: Attendance in program 2-3 days a week with increased exercise capacity. Reported lower sodium intake. Reported increased fruit and vegetable intake. Reports medication compliance.;Short term: Daily weights obtained and reported for increase. Utilizing diuretic protocols set by physician.;Long term: Adoption of self-care skills and reduction of barriers for early signs and symptoms recognition and intervention leading to self-care maintenance.    Hypertension Yes    Intervention Provide education on lifestyle  modifcations including regular physical activity/exercise, weight management, moderate sodium restriction and increased consumption of fresh fruit, vegetables, and low fat dairy, alcohol moderation, and smoking  cessation.;Monitor prescription use compliance.    Expected Outcomes Short Term: Continued assessment and intervention until BP is < 140/39mm HG in hypertensive participants. < 130/17mm HG in hypertensive participants with diabetes, heart failure or chronic kidney disease.;Long Term: Maintenance of blood pressure at goal levels.    Lipids Yes    Intervention Provide education and support for participant on nutrition & aerobic/resistive exercise along with prescribed medications to achieve LDL 70mg , HDL >40mg .    Expected Outcomes Short Term: Participant states understanding of desired cholesterol values and is compliant with medications prescribed. Participant is following exercise prescription and nutrition guidelines.;Long Term: Cholesterol controlled with medications as prescribed, with individualized exercise RX and with personalized nutrition plan. Value goals: LDL < 70mg , HDL > 40 mg.          Education:Diabetes - Individual verbal and written instruction to review signs/symptoms of diabetes, desired ranges of glucose level fasting, after meals and with exercise. Acknowledge that pre and post exercise glucose checks will be done for 3 sessions at entry of program.   Core Components/Risk Factors/Patient Goals Review:   Goals and Risk Factor Review     Row Name 01/12/24 0951 02/14/24 0948           Core Components/Risk Factors/Patient Goals Review   Personal Goals Review Weight Management/Obesity;Diabetes Weight Management/Obesity;Diabetes      Review Habib is doing well in rehab. He has improved since starting the program and has been feeling better. He is watching his weight and eating better. His A1C was on the higher end 6 months ago so he has been making nutritional changes to help lower it. Ethridge continues to do well in rehab. He is about to graduate from the program and has enjoyed his time in rehab. He is wtching what he is eating for his weight  and also watching his sugar for  his A1C.      Expected Outcomes short: contineu to cut back on suger for A1C   long: continue to exercise for overall happiness short: contineu to cut back on suger for A1C   long: continue to exercise for overall happiness         Core Components/Risk Factors/Patient Goals at Discharge (Final Review):   Goals and Risk Factor Review - 02/14/24 0948       Core Components/Risk Factors/Patient Goals Review   Personal Goals Review Weight Management/Obesity;Diabetes    Review Breckin continues to do well in rehab. He is about to graduate from the program and has enjoyed his time in rehab. He is wtching what he is eating for his weight  and also watching his sugar for his A1C.    Expected Outcomes short: contineu to cut back on suger for A1C   long: continue to exercise for overall happiness          ITP Comments:  ITP Comments     Row Name 12/08/23 1506 12/11/23 0923 12/13/23 1036 12/27/23 1427 01/24/24 1013   ITP Comments Virtual orientation visit completed for cardiac rehab with chronic systolic HF. On-site orientation visit scheduled for 12/11/23 at 0800. Patient arrived for 1st visit/orientation/education at 0800. Patient was referred to CR by Dr. Pamella Blanch at the [facility] due to Chronic systolic HF. During orientation advised patient on arrival and appointment times what to wear, what to do before, during and after exercise.  Reviewed attendance and class policy.  Pt is scheduled to return Cardiac Rehab on 12/13/23 at 915. Pt was advised to come to class 15 minutes before class starts.  Discussed RPE/Dpysnea scales. Patient participated in warm up stretches. Patient was able to complete 6 minute walk test.  Telemetry:NSR. Patient was measured for the equipment. Discussed equipment safety with patient. Took patient pre-anthropometric measurements. Patient finished visit at 915. First full day of exercise!  Patient was oriented to gym and equipment including functions, settings, policies, and procedures.   Patient's individual exercise prescription and treatment plan were reviewed.  All starting workloads were established based on the results of the 6 minute walk test done at initial orientation visit.  The plan for exercise progression was also introduced and progression will be customized based on patient's performance and goals. 30 day review completed. ITP sent to Dr. Dorn Ross, Medical Director of Cardiac Rehab. Continue with ITP unless changes are made by physician. Still new to program 30 day review completed. ITP sent to Dr. Dorn Ross, Medical Director of Cardiac Rehab. Continue with ITP unless changes are made by physician.    Row Name 02/21/24 0807           ITP Comments 30 day review completed. ITP sent to Dr. Dorn Ross, Medical Director of Cardiac Rehab. Continue with ITP unless changes are made by physician.          Comments: 30 day review\

## 2024-02-22 ENCOUNTER — Encounter (HOSPITAL_COMMUNITY): Payer: Self-pay

## 2024-02-22 DIAGNOSIS — I5022 Chronic systolic (congestive) heart failure: Secondary | ICD-10-CM

## 2024-02-22 NOTE — Progress Notes (Signed)
 Cardiac Individual Treatment Plan  Patient Details  Name: Stephen Macdonald MRN: [id number] Date of Birth: [birth year] Referring Provider:   Flowsheet Row CARDIAC REHAB PHASE II ORIENTATION from 12/11/2023 in [facility] CARDIAC REHABILITATION  Referring Provider Tobie Pamella Guadalajara MD    Initial Encounter Date:  Flowsheet Row CARDIAC REHAB PHASE II ORIENTATION from 12/11/2023 in [place] CARDIAC REHABILITATION  Date 12/11/23    Visit Diagnosis: Heart failure, chronic systolic (HCC)  Patient's Home Medications on Admission:  Current Outpatient Medications:    acetaminophen  (TYLENOL ) 500 MG tablet, Take 2 tablets (1,000 mg total) by mouth every 6 (six) hours as needed for mild pain., Disp: , Rfl:    apixaban (ELIQUIS) 5 MG TABS tablet, Take 5 mg by mouth 2 (two) times daily., Disp: , Rfl:    atorvastatin  (LIPITOR ) 80 MG tablet, Take 1 tablet (80 mg total) by mouth daily., Disp: 30 tablet, Rfl: 11   benzonatate  (TESSALON ) 100 MG capsule, Take 1 capsule (100 mg total) by mouth every 8 (eight) hours. (Patient not taking: Reported on 12/11/2023), Disp: 30 capsule, Rfl: 0   cholecalciferol  (VITAMIN D ) 1000 UNITS tablet, Take 1,000 Units by mouth at bedtime. , Disp: , Rfl:    clopidogrel  (PLAVIX ) 75 MG tablet, Take 1 tablet (75 mg total) by mouth daily. Start on Sunday 11/11/18 (Patient not taking: Reported on 12/11/2023), Disp: 30 tablet, Rfl: 2   empagliflozin (JARDIANCE) 25 MG TABS tablet, Take 25 mg by mouth daily., Disp: , Rfl:    ezetimibe  (ZETIA ) 10 MG tablet, Take 10 mg by mouth daily., Disp: , Rfl:    finasteride (PROSCAR) 5 MG tablet, Take 5 mg by mouth daily., Disp: , Rfl:    furosemide  (LASIX ) 40 MG tablet, Take 0.5 tablets (20 mg total) by mouth daily. Start after BMP test recheck on Monday 11/12/18 (Patient not taking: Reported on 12/11/2023), Disp: 30 tablet, Rfl: 1   metoprolol  succinate (TOPROL -XL) 25 MG 24 hr tablet, Take 25 mg by mouth every evening. , Disp: , Rfl:    nitroGLYCERIN   (NITROSTAT ) 0.4 MG SL tablet, Place 1 tablet (0.4 mg total) under the tongue every 5 (five) minutes as needed for chest pain., Disp: 25 tablet, Rfl: 3   ondansetron  (ZOFRAN ) 4 MG tablet, Take 1 tablet (4 mg total) by mouth every 6 (six) hours as needed for nausea., Disp: 14 tablet, Rfl: 0   oxyCODONE  (OXY IR/ROXICODONE ) 5 MG immediate release tablet, Take 1 tablet (5 mg total) by mouth every 6 (six) hours as needed for severe pain. (Patient not taking: Reported on 12/11/2023), Disp: 15 tablet, Rfl: 0   spironolactone (ALDACTONE) 25 MG tablet, Take 25 mg by mouth 2 (two) times daily. (Patient not taking: Reported on 12/11/2023), Disp: , Rfl:    tamsulosin  (FLOMAX ) 0.4 MG CAPS capsule, Take 0.4 mg by mouth every morning. (Patient not taking: Reported on 12/11/2023), Disp: , Rfl:    valsartan  (DIOVAN ) 40 MG tablet, Take 1 tablet (40 mg total) by mouth at bedtime. Start after BMP test recheck on Monday 11/12/18 (Patient taking differently: Take 20 mg by mouth at bedtime.), Disp: 30 tablet, Rfl: 1  Past Medical History: Past Medical History:  Diagnosis Date   Chronic combined systolic and diastolic CHF, NYHA class 3 (HCC)    a. 01/2015 Echo: EF 35-30%; b. 03/2016 Echo: Ef 20%, Gr1DD, mid-apical anterior, mid anteroseptal, apical inferior, basal inferolateral, mid anterolateral, and apical AK, basal anteroseptal and mid inferior severe HK, midlly dil LA, mild TR.  CKD (chronic kidney disease), stage III (HCC) 04/15/2016   Coronary artery disease    a. 1997 s/p CABG;  b. 01/2015 MI/PCI: G->PDA;  c. 03/2016 NSTEMI/PCI: LM 20, LAD 154m, D2 80, RI 60, LCX ok, OM1 90, RCA 60p, 146m, RPAV 80, G->RPDA 15p ISR, 90d (3.5x28 Promus Premier DES), LIMA->OM1->dLAD nl.   High cholesterol    Hypertension    Ischemic cardiomyopathy    a. 01/2015 Echo: EF 25-30%; b. s/p AICD;  c. 03/2016 Echo: Ef 20%, Gr1DD.   Smoker     Tobacco Use: Social History   Tobacco Use  Smoking Status Some Days   Types: Cigars, Cigarettes   Smokeless Tobacco Former   Types: Snuff  Tobacco Comments   Discussed cessation and shared we have classes that he could attend.     Labs: Review Flowsheet       Latest Ref Rng & Units 02/12/2015 04/16/2016  Labs for ITP Cardiac and Pulmonary Rehab  Cholestrol 0 - 200 mg/dL - 842   LDL (calc) 0 - 99 mg/dL - 95   HDL-C >59 mg/dL - 43   Trlycerides <849 mg/dL - 96   Hemoglobin J8r 4.8 - 5.6 % - 5.6   TCO2 0 - 100 mmol/L 18  -     Exercise Target Goals: Exercise Program Goal: Individual exercise prescription set using results from initial 6 min walk test and THRR while considering  patient's activity barriers and safety.   Exercise Prescription Goal: Initial exercise prescription builds to 30-45 minutes a day of aerobic activity, 2-3 days per week.  Home exercise guidelines will be given to patient during program as part of exercise prescription that the participant will acknowledge.   Education: Aerobic Exercise: - Group verbal and visual presentation on the components of exercise prescription. Introduces F.I.T.T principle from ACSM for exercise prescriptions.  Reviews F.I.T.T. principles of aerobic exercise including progression. Written material provided at class time. Flowsheet Row CARDIAC REHAB PHASE II EXERCISE from 02/14/2024 in [place] CARDIAC REHABILITATION  Date 12/13/23  Educator jh  Instruction Review Code 2- Demonstrated Understanding    Education: Resistance Exercise: - Group verbal and visual presentation on the components of exercise prescription. Introduces F.I.T.T principle from ACSM for exercise prescriptions  Reviews F.I.T.T. principles of resistance exercise including progression. Written material provided at class time. Flowsheet Row CARDIAC REHAB PHASE II EXERCISE from 02/14/2024 in [place] CARDIAC REHABILITATION  Date 12/20/23  Educator jh  Instruction Review Code 2- Demonstrated Understanding     Education: Exercise & Equipment Safety: -  Individual verbal instruction and demonstration of equipment use and safety with use of the equipment.   Education: Exercise Physiology & General Exercise Guidelines: - Group verbal and written instruction with models to review the exercise physiology of the cardiovascular system and associated critical values. Provides general exercise guidelines with specific guidelines to those with heart or lung disease. Written material provided at class time. Flowsheet Row CARDIAC REHAB PHASE II EXERCISE from 02/14/2024 in [place] CARDIAC REHABILITATION  Date 02/07/24  Educator [facility]  Instruction Review Code 1- Verbalizes Understanding    Education: Flexibility, Balance, Mind/Body Relaxation: - Group verbal and visual presentation with interactive activity on the components of exercise prescription. Introduces F.I.T.T principle from ACSM for exercise prescriptions. Reviews F.I.T.T. principles of flexibility and balance exercise training including progression. Also discusses the mind body connection.  Reviews various relaxation techniques to help reduce and manage stress (i.e. Deep breathing, progressive muscle relaxation, and visualization). Balance handout provided to  take home. Written material provided at class time. Flowsheet Row CARDIAC REHAB PHASE II EXERCISE from 02/14/2024 in [place] CARDIAC RE[facility]ITATION  Date 12/20/23  Educator jh  Instruction Review Code 2- Demonstrated Understanding    Activity Barriers & Risk Stratification:   6 Minute Walk:  6 Minute Walk     Row Name 02/14/24 0942         6 Minute Walk   Phase Discharge     Distance 1650 feet     Distance Feet Change 142 ft     Walk Time 6 minutes     # of Rest Breaks 0     MPH 3.12     METS 3.03     RPE 8     Perceived Dyspnea  0     VO2 Peak 10.6     Symptoms No     Resting HR 61 bpm     Resting BP 100/60     Resting Oxygen Saturation  98 %     Exercise Oxygen Saturation  during 6 min walk 98 %     Max Ex. HR  101 bpm     Max Ex. BP 120/60     2 Minute Post BP 110/60        Oxygen Initial Assessment:   Oxygen Re-Evaluation:   Oxygen Discharge (Final Oxygen Re-Evaluation):   Initial Exercise Prescription:   Perform Capillary Blood Glucose checks as needed.  Exercise Prescription Changes:   Exercise Prescription Changes     Row Name 01/01/24 1300 01/22/24 1300 02/05/24 1500         Response to Exercise   Blood Pressure (Admit) 108/66 134/70 120/64     Blood Pressure (Exit) 100/62 100/62 106/50     Heart Rate (Admit) 70 bpm 65 bpm 63 bpm     Heart Rate (Exercise) 113 bpm 120 bpm 104 bpm     Heart Rate (Exit) 75 bpm 88 bpm 78 bpm     Rating of Perceived Exertion (Exercise) 10 11 10      Duration Continue with 30 min of aerobic exercise without signs/symptoms of physical distress. Continue with 30 min of aerobic exercise without signs/symptoms of physical distress. Continue with 30 min of aerobic exercise without signs/symptoms of physical distress.     Intensity THRR unchanged THRR unchanged THRR unchanged       Progression   Progression Continue to progress workloads to maintain intensity without signs/symptoms of physical distress. Continue to progress workloads to maintain intensity without signs/symptoms of physical distress. Continue to progress workloads to maintain intensity without signs/symptoms of physical distress.       Resistance Training   Training Prescription Yes Yes Yes     Weight 4 4 4      Reps 10-15 10-15 10-15       Treadmill   MPH 3 3.3 3.3     Grade 1.5 2.5 2     Minutes 15 15 15      METs 3.92 4.66 4.43       REL-XR   Level 4 5 5      Speed 48 56 49     Minutes 15 15 15      METs 3.2 4.2 6.6        Exercise Comments:   Exercise Goals and Review:   Exercise Goals Re-Evaluation :  Exercise Goals Re-Evaluation     Row Name 01/01/24 1330 01/12/24 0939 01/24/24 1323 02/06/24 0846 02/14/24 0944     Exercise Goal Re-Evaluation  Exercise Goals  Review Increase Physical Activity;Increase Strength and Stamina;Understanding of Exercise Prescription Increase Physical Activity;Increase Strength and Stamina;Understanding of Exercise Prescription Increase Physical Activity;Increase Strength and Stamina;Understanding of Exercise Prescription Increase Physical Activity;Increase Strength and Stamina;Understanding of Exercise Prescription Increase Physical Activity;Increase Strength and Stamina;Able to understand and use Dyspnea scale   Comments Jakiah is doing well in rehab. He has completed 11 sessions of CR. He has increased his walking speed on the treadmill to 3.0 with a grade of 1.5. Will continue to monitor and progress as able. Braeden is doing well in rehab. He stated that he does enjoy coming to class and feels good after exercise. He is doing resistance bands at home but is not  walking . He has noticed that he is able to do stairs easier then before the start of the program. Kharson has completed 23 sessions of CR. He is increasing his MPH on the treadmill to 3.3 with a grade of 2.5 and feeling good this. He continues to exericse at home but has not started to walk. Will continue to monitor and progress as able. Alexande has completed 28 sessions of CR. He continue to do well in the program and increasing his levels to push himself. Will continue to monitor and progress as able. Russell is doing well in rehab. He is about to finish the program in the next week. He is pushing himself in class and exerciseing oputside class   Expected Outcomes Short: increase levels when able   long: continue to exercsie Short: add walking at homw into week   long: continues to attend rehab Short: continue to increase levels when able   long: contineue to attned rehab Short: comtimue to exercise and push himself   long: continue to attend rehab Short: comtimue to exercise and push himself   long: continue to attend rehab      Discharge Exercise Prescription (Final Exercise  Prescription Changes):  Exercise Prescription Changes - 02/05/24 1500       Response to Exercise   Blood Pressure (Admit) 120/64    Blood Pressure (Exit) 106/50    Heart Rate (Admit) 63 bpm    Heart Rate (Exercise) 104 bpm    Heart Rate (Exit) 78 bpm    Rating of Perceived Exertion (Exercise) 10    Duration Continue with 30 min of aerobic exercise without signs/symptoms of physical distress.    Intensity THRR unchanged      Progression   Progression Continue to progress workloads to maintain intensity without signs/symptoms of physical distress.      Resistance Training   Training Prescription Yes    Weight 4    Reps 10-15      Treadmill   MPH 3.3    Grade 2    Minutes 15    METs 4.43      REL-XR   Level 5    Speed 49    Minutes 15    METs 6.6          Nutrition:  Target Goals: Understanding of nutrition guidelines, daily intake of sodium 1500mg , cholesterol 200mg , calories 30% from fat and 7% or less from saturated fats, daily to have 5 or more servings of fruits and vegetables.  Education: Nutrition 1 -Group instruction provided by verbal, written material, interactive activities, discussions, models, and posters to present general guidelines for heart healthy nutrition including macronutrients, label reading, and promoting whole foods over processed counterparts. Education serves as pensions consultant of discussion of heart healthy eating for  all. Written material provided at class time. Flowsheet Row CARDIAC REHAB PHASE II EXERCISE from 02/14/2024 in [place] CARDIAC REHABILITATION  Date 12/27/23  Educator DJ  Instruction Review Code 1- Verbalizes Understanding     Education: Nutrition 2 -Group instruction provided by verbal, written material, interactive activities, discussions, models, and posters to present general guidelines for heart healthy nutrition including sodium, cholesterol, and saturated fat. Providing guidance of habit forming to improve blood  pressure, cholesterol, and body weight. Written material provided at class time. Flowsheet Row CARDIAC REHAB PHASE II EXERCISE from 02/14/2024 in [place] CARDIAC REHABILITATION  Date 12/27/23  Educator DJ  Instruction Review Code 1- Verbalizes Understanding      Biometrics:   Post Biometrics - 02/14/24 0943        Post  Biometrics   Height 6' 1 (1.854 m)    Weight 215 lb 6.2 oz (97.7 kg)    Waist Circumference 10 inches    Hip Circumference 40 inches    Waist to Hip Ratio 0.25 %    BMI (Calculated) 28.42    Grip Strength 39.7 kg    Single Leg Stand 30 seconds          Nutrition Therapy Plan and Nutrition Goals:   Nutrition Assessments:  MEDIFICTS Score Key: >=70 Need to make dietary changes  40-70 Heart Healthy Diet <= 40 Therapeutic Level Cholesterol Diet  Flowsheet Row CARDIAC REHAB PHASE II EXERCISE from 02/19/2024 in [facility] CARDIAC REHABILITATION  Picture Your Plate Total Score on Admission 30  Picture Your Plate Total Score on Discharge 65   Picture Your Plate Scores: <59 Unhealthy dietary pattern with much room for improvement. 41-50 Dietary pattern unlikely to meet recommendations for good health and room for improvement. 51-60 More healthful dietary pattern, with some room for improvement.  >60 Healthy dietary pattern, although there may be some specific behaviors that could be improved.    Nutrition Goals Re-Evaluation:  Nutrition Goals Re-Evaluation     Row Name 01/12/24 0945 02/14/24 0947           Goals   Nutrition Goal healthy eating healthy eating      Comment Nishawn is doing well in rehab. He is trying to eat healthier meals. He is eating mostly chicken that is baked but will fry it sometimes. He is eatting lots of veggies and loves apples and small oranges Cuties. His A1C was on the higher end 6 months ago so he has made a lifestyle change cutting out white bread, sweet tea and other sweets to help lower A1C and prevent being  diabetic. He is not much of a salt eater and does not eat things lilke chips. Emaad has been doihng well in class and will graduate soon. He continues to eat  well and picking healthy optoioins for meals. He is continuing to watch his sugar intake to help lower his A1C      Expected Outcome Short: try to cut down on fried chiecken  long: continue to focus on healthy eating Short: try to cut down on fried chiecken  long: continue to focus on healthy eating         Nutrition Goals Discharge (Final Nutrition Goals Re-Evaluation):  Nutrition Goals Re-Evaluation - 02/14/24 0947       Goals   Nutrition Goal healthy eating    Comment Linzie has been doihng well in class and will graduate soon. He continues to eat  well and picking healthy optoioins for meals. He is  continuing to watch his sugar intake to help lower his A1C    Expected Outcome Short: try to cut down on fried chiecken  long: continue to focus on healthy eating          Psychosocial: Target Goals: Acknowledge presence or absence of significant depression and/or stress, maximize coping skills, provide positive support system. Participant is able to verbalize types and ability to use techniques and skills needed for reducing stress and depression.   Education: Stress, Anxiety, and Depression - Group verbal and visual presentation to define topics covered.  Reviews how body is impacted by stress, anxiety, and depression.  Also discusses healthy ways to reduce stress and to treat/manage anxiety and depression. Written material provided at class time. Flowsheet Row CARDIAC REHAB PHASE II EXERCISE from 02/14/2024 in [place] CARD[facility]  Date 01/03/24  Educator HB  Instruction Review Code 1- [employer] Understanding    Education: Sleep Hygiene -Provides group verbal and written instruction about how sleep can affect your health.  Define sleep hygiene, discuss sleep cycles and impact of sleep habits. Review good sleep  hygiene tips. Flowsheet Row CARDIAC REHAB PHASE II EXERCISE from 02/14/2024 in [place] [facility]  Date 01/03/24  Educator HB  Instruction Review Code 1- Verbalizes Understanding    Initial Review & Psychosocial Screening:   Quality of Life Scores:   Quality of Life - 02/19/24 0942       Quality of Life   Select Quality of Life      Quality of Life Scores   Health/Function Pre 21.83 %    Health/Function Post 22.6 %    Health/Function % Change 3.53 %    Socioeconomic Pre 25.71 %    Socioeconomic Post 23.21 %    Socioeconomic % Change  -9.72 %    Psych/Spiritual Pre 20.79 %    Psych/Spiritual Post 26.07 %    Psych/Spiritual % Change 25.4 %    Family Pre 28.8 %    Family Post 27.7 %    Family % Change -3.82 %    GLOBAL Pre 23.44 %    GLOBAL Post 24.13 %    GLOBAL % Change 2.94 %         Scores of 19 and below usually indicate a poorer quality of life in these areas.  A difference of  2-3 points is a clinically meaningful difference.  A difference of 2-3 points in the total score of the Quality of Life Index has been associated with significant improvement in overall quality of life, self-image, physical symptoms, and general health in studies assessing change in quality of life.  PHQ-9: Review Flowsheet  More data exists      02/19/2024 12/11/2023 11/02/2016 07/21/2016 11/05/2015  Depression screen PHQ 2/9  Decreased Interest 1 1 1 1 1   Down, Depressed, Hopeless 0 0 0 0 0  PHQ - 2 Score 1 1 1 1 1   Altered sleeping 1 0 1 0 0  Tired, decreased energy 2 1 2 2 1   Change in appetite 0 0 0 0 0  Feeling bad or failure about yourself  0 0 0 0 0  Trouble concentrating 0 0 0 0 0  Moving slowly or fidgety/restless 0 0 0 0 0  Suicidal thoughts 0 0 0 0  0   PHQ-9 Score 4 2 4 3 2   Difficult doing work/chores Somewhat difficult Not difficult at all Somewhat difficult Somewhat difficult Somewhat difficult    Details  Data saved with a previous flowsheet row  definition        Interpretation of Total Score  Total Score Depression Severity:  1-4 = Minimal depression, 5-9 = Mild depression, 10-14 = Moderate depression, 15-19 = Moderately severe depression, 20-27 = Severe depression   Psychosocial Evaluation and Intervention:   Psychosocial Re-Evaluation:  Psychosocial Re-Evaluation     Row Name 01/12/24 0941 02/14/24 0945           Psychosocial Re-Evaluation   Current issues with None Identified None Identified      Comments Khiem stated that he has been feeling good and does not have any stress or sleeping issues. He does sleep 7-8 hours a night with getting up once but is able to go back to bed. Moyses is about to finish the program in the next week. He has done well and enjoyed coming. He contiues to sleep well and have no major stressors      Expected Outcomes Short: continue to have no issees    long: continuet  to exericse for overall happiness Short: continue to have no stressors    long: continue to exercise for overall well beoing      Interventions Encouraged to attend Cardiac Rehabilitation for the exercise Encouraged to attend Cardiac Rehabilitation for the exercise      Continue Psychosocial Services  Follow up required by staff Follow up required by staff         Psychosocial Discharge (Final Psychosocial Re-Evaluation):  Psychosocial Re-Evaluation - 02/14/24 0945       Psychosocial Re-Evaluation   Current issues with None Identified    Comments Brier is about to finish the program in the next week. He has done well and enjoyed coming. He contiues to sleep well and have no major stressors    Expected Outcomes Short: continue to have no stressors    long: continue to exercise for overall well beoing    Interventions Encouraged to attend Cardiac Rehabilitation for the exercise    Continue Psychosocial Services  Follow up required by staff          Vocational Rehabilitation: Provide vocational rehab assistance to  qualifying candidates.   Vocational Rehab Evaluation & Intervention:   Education: Education Goals: Education classes will be provided on a variety of topics geared toward better understanding of heart health and risk factor modification. Participant will state understanding/return demonstration of topics presented as noted by education test scores.  Learning Barriers/Preferences:   General Cardiac Education Topics:  AED/CPR: - Group verbal and written instruction with the use of models to demonstrate the basic use of the AED with the basic ABC's of resuscitation.   Test and Procedures: - Group verbal and visual presentation and models provide information about basic cardiac anatomy and function. Reviews the testing methods done to diagnose heart disease and the outcomes of the test results. Describes the treatment choices: Medical Management, Angioplasty, or Coronary Bypass Surgery for treating various heart conditions including Myocardial Infarction, Angina, Valve Disease, and Cardiac Arrhythmias. Written material provided at class time. Flowsheet Row CARDIAC REHAB PHASE II EXERCISE from 02/14/2024 in [place] [facility]  Date 01/31/24  Educator jh  Instruction Review Code 2- Demonstrated Understanding    Medication Safety: - Group verbal and visual instruction to review commonly prescribed medications for heart and lung disease. Reviews the medication, class of the drug, and side effects. Includes the steps to properly store meds and maintain the prescription regimen. Written material provided at class  time. Flowsheet Row CARDIAC REHAB PHASE II EXERCISE from 02/14/2024 in [place] CARD[facility]  Date 01/17/24  Educator [facility]  Instruction Review Code 1- Verbalizes Understanding    Intimacy: - Group verbal instruction through game format to discuss how heart and lung disease can affect sexual intimacy. Written material provided at class time. Flowsheet Row  CARDIAC REHAB PHASE II EXERCISE from 02/14/2024 in [place] [facility] RE[facility]  Date 12/13/23  Educator jh  Instruction Review Code 2- Demonstrated Understanding    Know Your Numbers and Heart Failure: - Group verbal and visual instruction to discuss disease risk factors for cardiac and pulmonary disease and treatment options.  Reviews associated critical values for Overweight/Obesity, Hypertension, Cholesterol, and Diabetes.  Discusses basics of heart failure: signs/symptoms and treatments.  Introduces Heart Failure Zone chart for action plan for heart failure. Written material provided at class time. Flowsheet Row CARDIAC REHAB PHASE II EXERCISE from 02/14/2024 in [place] CARDIAC REHABILITATION  Instruction Review Code 1- Verbalizes Understanding    Infection Prevention: - Provides verbal and written material to individual with discussion of infection control including proper hand washing and proper equipment cleaning during exercise session.   Falls Prevention: - Provides verbal and written material to individual with discussion of falls prevention and safety.   Other: -Provides group and verbal instruction on various topics (see comments)   Knowledge Questionnaire Score:  Knowledge Questionnaire Score - 02/19/24 0943       Knowledge Questionnaire Score   Pre Score 20/26    Post Score 22/24          Core Components/Risk Factors/Patient Goals at Admission:   Education:Diabetes - Individual verbal and written instruction to review signs/symptoms of diabetes, desired ranges of glucose level fasting, after meals and with exercise. Acknowledge that pre and post exercise glucose checks will be done for 3 sessions at entry of program.   Core Components/Risk Factors/Patient Goals Review:   Goals and Risk Factor Review     Row Name 01/12/24 0951 02/14/24 0948           Core Components/Risk Factors/Patient Goals Review   Personal Goals Review Weight  Management/Obesity;Diabetes Weight Management/Obesity;Diabetes      Review Eura is doing well in rehab. He has improved since starting the program and has been feeling better. He is watching his weight and eating better. His A1C was on the higher end 6 months ago so he has been making nutritional changes to help lower it. Michaeljohn continues to do well in rehab. He is about to graduate from the program and has enjoyed his time in rehab. He is wtching what he is eating for his weight  and also watching his sugar for his A1C.      Expected Outcomes short: contineu to cut back on suger for A1C   long: continue to exercise for overall happiness short: contineu to cut back on suger for A1C   long: continue to exercise for overall happiness         Core Components/Risk Factors/Patient Goals at Discharge (Final Review):   Goals and Risk Factor Review - 02/14/24 0948       Core Components/Risk Factors/Patient Goals Review   Personal Goals Review Weight Management/Obesity;Diabetes    Review Crist continues to do well in rehab. He is about to graduate from the program and has enjoyed his time in rehab. He is wtching what he is eating for his weight  and also watching his sugar for his A1C.  Expected Outcomes short: contineu to cut back on suger for A1C   long: continue to exercise for overall happiness          ITP Comments:  ITP Comments     Row Name 12/27/23 1427 01/24/24 1013 02/21/24 0807       ITP Comments 30 day review completed. ITP sent to Dr. Dorn Ross, Medical Director of Cardiac Rehab. Continue with ITP unless changes are made by physician. Still new to program 30 day review completed. ITP sent to Dr. Dorn Ross, Medical Director of Cardiac Rehab. Continue with ITP unless changes are made by physician. 30 day review completed. ITP sent to Dr. Dorn Ross, Medical Director of Cardiac Rehab. Continue with ITP unless changes are made by physician.        Comments: Discharge  ITP

## 2024-02-22 NOTE — Progress Notes (Signed)
 Discharge Progress Report  Patient Details  Name: Stephen Macdonald MRN: [id number] Date of Birth: [birth year] Referring Provider:   Flowsheet Row CARDIAC REHAB PHASE II ORIENTATION from 12/11/2023 in [facility] C[facility]  Referring Provider Tobie Pamella [place] MD     Number of Visits: 34  Reason for Discharge:  Patient reached a stable level of exercise. Patient independent in their exercise. Patient has met program and personal goals.  Smoking History:  Social History   Tobacco Use  Smoking Status Some Days   Types: Cigars, Cigarettes  Smokeless Tobacco Former   Types: Snuff  Tobacco Comments   Discussed cessation and shared we have classes that he could attend.     Diagnosis:  Heart failure, chronic systolic (HCC)  ADL [facility]:   Initial Exercise Prescription:   Discharge Exercise Prescription (Final Exercise Prescription Changes):  Exercise Prescription Changes - 02/05/24 1500       Response to Exercise   Blood Pressure (Admit) 120/64    Blood Pressure (Exit) 106/50    Heart Rate (Admit) 63 bpm    Heart Rate (Exercise) 104 bpm    Heart Rate (Exit) 78 bpm    Rating of Perceived Exertion (Exercise) 10    Duration Continue with 30 min of aerobic exercise without signs/symptoms of physical distress.    Intensity THRR unchanged      Progression   Progression Continue to progress workloads to maintain intensity without signs/symptoms of physical distress.      Resistance Training   Training Prescription Yes    Weight 4    Reps 10-15      Treadmill   MPH 3.3    Grade 2    Minutes 15    METs 4.43      REL-XR   Level 5    Speed 49    Minutes 15    METs 6.6          Functional Capacity:  6 Minute Walk     Row Name 02/14/24 0942         6 Minute Walk   Phase Discharge     Distance 1650 feet     Distance Feet Change 142 ft     Walk Time 6 minutes     # of Rest Breaks 0     MPH 3.12     METS 3.03     RPE 8     Perceived Dyspnea   0     VO2 Peak 10.6     Symptoms No     Resting HR 61 bpm     Resting BP 100/60     Resting Oxygen Saturation  98 %     Exercise Oxygen Saturation  during 6 min walk 98 %     Max Ex. HR 101 bpm     Max Ex. BP 120/60     2 Minute Post BP 110/60        Psychological, QOL, Others - Outcomes: PHQ 2/9:    02/19/2024    9:44 AM 12/11/2023    9:18 AM 11/02/2016   12:41 PM 07/21/2016   11:26 AM 11/05/2015    4:27 PM  Depression screen PHQ 2/9  Decreased Interest 1 1 1 1 1   Down, Depressed, Hopeless 0 0 0 0 0  PHQ - 2 Score 1 1 1 1 1   Altered sleeping 1 0 1 0 0  Tired, decreased energy 2 1 2 2 1   Change in appetite 0 0 0  0 0  Feeling bad or failure about yourself  0 0 0 0 0  Trouble concentrating 0 0 0 0 0  Moving slowly or fidgety/restless 0 0 0 0 0  Suicidal thoughts 0 0 0 0  0   PHQ-9 Score 4 2 4 3 2   Difficult doing work/chores Somewhat difficult Not difficult at all Somewhat difficult Somewhat difficult Somewhat difficult     Data saved with a previous flowsheet row definition     Nutrition & Weight - Outcomes:   Post Biometrics - 02/14/24 0943        Post  Biometrics   Height 6' 1 (1.854 m)    Weight 215 lb 6.2 oz (97.7 kg)    Waist Circumference 10 inches    Hip Circumference 40 inches    Waist to Hip Ratio 0.25 %    BMI (Calculated) 28.42    Grip Strength 39.7 kg    Single Leg Stand 30 seconds          Education Questionnaire Score:  Knowledge Questionnaire Score - 02/19/24 0943       Knowledge Questionnaire Score   Pre Score 20/26    Post Score 22/24          Goals reviewed with patient; copy given to patient.

## 2024-02-23 ENCOUNTER — Encounter (HOSPITAL_COMMUNITY)

## 2024-02-26 ENCOUNTER — Encounter (HOSPITAL_COMMUNITY)

## 2024-02-28 ENCOUNTER — Encounter (HOSPITAL_COMMUNITY)

## 2024-03-01 ENCOUNTER — Encounter (HOSPITAL_COMMUNITY)
